# Patient Record
Sex: Female | Born: 1995 | Race: Black or African American | Hispanic: No | Marital: Married | Smoking: Current every day smoker
Health system: Southern US, Community
[De-identification: ages and names within clinical notes are randomized; demographics above are authoritative.]

## PROBLEM LIST (undated history)

## (undated) DIAGNOSIS — J45909 Unspecified asthma, uncomplicated: Secondary | ICD-10-CM

---
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
## 2016-07-27 ENCOUNTER — Ambulatory Visit (HOSPITAL_COMMUNITY)
Admission: EM | Admit: 2016-07-27 | Discharge: 2016-07-27 | Disposition: A | Discharge status: Home / Self Care | Payer: 59 | Attending: Family Medicine | Admitting: Family Medicine

## 2016-07-27 ENCOUNTER — Emergency Department (HOSPITAL_COMMUNITY)
Admission: EM | Admit: 2016-07-27 | Discharge: 2016-07-27 | Disposition: A | Discharge status: Home / Self Care | Payer: 59 | Attending: Emergency Medicine | Admitting: Emergency Medicine

## 2016-07-27 ENCOUNTER — Encounter (HOSPITAL_COMMUNITY): Payer: Self-pay | Admitting: Family Medicine

## 2016-07-27 ENCOUNTER — Emergency Department (HOSPITAL_COMMUNITY): Payer: 59

## 2016-07-27 ENCOUNTER — Encounter (HOSPITAL_COMMUNITY): Payer: Self-pay | Admitting: Emergency Medicine

## 2016-07-27 DIAGNOSIS — J45909 Unspecified asthma, uncomplicated: Secondary | ICD-10-CM | POA: Insufficient documentation

## 2016-07-27 DIAGNOSIS — N390 Urinary tract infection, site not specified: Secondary | ICD-10-CM | POA: Diagnosis not present

## 2016-07-27 DIAGNOSIS — Z79899 Other long term (current) drug therapy: Secondary | ICD-10-CM | POA: Diagnosis not present

## 2016-07-27 DIAGNOSIS — R1011 Right upper quadrant pain: Secondary | ICD-10-CM

## 2016-07-27 DIAGNOSIS — Z7982 Long term (current) use of aspirin: Secondary | ICD-10-CM | POA: Insufficient documentation

## 2016-07-27 DIAGNOSIS — F17290 Nicotine dependence, other tobacco product, uncomplicated: Secondary | ICD-10-CM | POA: Insufficient documentation

## 2016-07-27 HISTORY — DX: Unspecified asthma, uncomplicated: J45.909

## 2016-07-27 LAB — POCT I-STAT, CHEM 8
BUN: 4 mg/dL — ABNORMAL LOW (ref 6–20)
Calcium, Ion: 1.23 mmol/L (ref 1.15–1.40)
Chloride: 107 mmol/L (ref 101–111)
Creatinine, Ser: 0.8 mg/dL (ref 0.44–1.00)
Glucose, Bld: 86 mg/dL (ref 65–99)
HCT: 39 % (ref 36.0–46.0)
Hemoglobin: 13.3 g/dL (ref 12.0–15.0)
Potassium: 3.9 mmol/L (ref 3.5–5.1)
Sodium: 142 mmol/L (ref 135–145)
TCO2: 24 mmol/L (ref 0–100)

## 2016-07-27 LAB — POCT URINALYSIS DIP (DEVICE)
Glucose, UA: NEGATIVE mg/dL
Hgb urine dipstick: NEGATIVE
Ketones, ur: NEGATIVE mg/dL
Nitrite: NEGATIVE
Protein, ur: 30 mg/dL — AB
Specific Gravity, Urine: >=1.03 (ref 1.005–1.030)
Urobilinogen, UA: 0.2 mg/dL (ref 0.0–1.0)
pH: 5.5 (ref 5.0–8.0)

## 2016-07-27 LAB — COMPREHENSIVE METABOLIC PANEL
ALT: 17 U/L (ref 14–54)
AST: 20 U/L (ref 15–41)
Albumin: 4 g/dL (ref 3.5–5.0)
Alkaline Phosphatase: 78 U/L (ref 38–126)
Anion gap: 6 (ref 5–15)
BUN: 7 mg/dL (ref 6–20)
CO2: 24 mmol/L (ref 22–32)
Calcium: 9.5 mg/dL (ref 8.9–10.3)
Chloride: 108 mmol/L (ref 101–111)
Creatinine, Ser: 0.92 mg/dL (ref 0.44–1.00)
GFR calc Af Amer: >60 mL/min (ref >60)
GFR calc non Af Amer: >60 mL/min (ref >60)
Glucose, Bld: 84 mg/dL (ref 65–99)
Potassium: 4.1 mmol/L (ref 3.5–5.1)
Sodium: 138 mmol/L (ref 135–145)
Total Bilirubin: 0.5 mg/dL (ref 0.3–1.2)
Total Protein: 9.4 g/dL — ABNORMAL HIGH (ref 6.5–8.1)

## 2016-07-27 LAB — URINALYSIS, ROUTINE W REFLEX MICROSCOPIC
Bilirubin Urine: NEGATIVE
Glucose, UA: NEGATIVE mg/dL
Hgb urine dipstick: NEGATIVE
Ketones, ur: 5 mg/dL — AB
Nitrite: NEGATIVE
Protein, ur: 30 mg/dL — AB
Specific Gravity, Urine: 1.027 (ref 1.005–1.030)
pH: 5 (ref 5.0–8.0)

## 2016-07-27 LAB — LIPASE, BLOOD: Lipase: 21 U/L (ref 11–51)

## 2016-07-27 LAB — CBC
HCT: 35.9 % — ABNORMAL LOW (ref 36.0–46.0)
Hemoglobin: 11.8 g/dL — ABNORMAL LOW (ref 12.0–15.0)
MCH: 26.5 pg (ref 26.0–34.0)
MCHC: 32.9 g/dL (ref 30.0–36.0)
MCV: 80.5 fL (ref 78.0–100.0)
Platelets: 335 10*3/uL (ref 150–400)
RBC: 4.46 MIL/uL (ref 3.87–5.11)
RDW: 14 % (ref 11.5–15.5)
WBC: 8.4 10*3/uL (ref 4.0–10.5)

## 2016-07-27 LAB — I-STAT BETA HCG BLOOD, ED (MC, WL, AP ONLY): I-stat hCG, quantitative: <5 m[IU]/mL (ref <5)

## 2016-07-27 MED ORDER — SODIUM CHLORIDE 0.9 % IV BOLUS (SEPSIS): 1000.0000 mL | Freq: Once | INTRAVENOUS | Status: DC

## 2016-07-27 MED ORDER — HYDROCODONE-ACETAMINOPHEN 5-325 MG PO TABS
2.0000 | ORAL_TABLET | Freq: Once | ORAL | Status: AC
  Administered 2016-07-27: 2 via ORAL
  Filled 2016-07-27: qty 2

## 2016-07-27 MED ORDER — DEXTROSE 5 % IV SOLN
1.0000 g | Freq: Once | INTRAVENOUS | Status: AC
  Administered 2016-07-27: 1 g via INTRAVENOUS
  Filled 2016-07-27: qty 10

## 2016-07-27 MED ORDER — HYDROMORPHONE HCL 1 MG/ML IJ SOLN
1.0000 mg | Freq: Once | INTRAMUSCULAR | Status: AC
  Administered 2016-07-27: 1 mg via INTRAVENOUS
  Filled 2016-07-27: qty 1

## 2016-07-27 MED ORDER — ONDANSETRON HCL 4 MG/2ML IJ SOLN
4.0000 mg | Freq: Once | INTRAMUSCULAR | Status: AC
  Administered 2016-07-27: 4 mg via INTRAVENOUS
  Filled 2016-07-27: qty 2

## 2016-07-27 MED ORDER — HYDROCODONE-ACETAMINOPHEN 5-325 MG PO TABS: 1.0000 | ORAL_TABLET | ORAL | 0 refills | Status: DC | PRN

## 2016-07-27 MED ORDER — MORPHINE SULFATE (PF) 4 MG/ML IV SOLN: 4.0000 mg | Freq: Once | INTRAVENOUS | Status: DC

## 2016-07-27 MED ORDER — SODIUM CHLORIDE 0.9 % IV BOLUS (SEPSIS)
1000.0000 mL | Freq: Once | INTRAVENOUS | Status: AC
  Administered 2016-07-27: 1000 mL via INTRAVENOUS

## 2016-07-27 MED ORDER — CEPHALEXIN 500 MG PO CAPS: 500.0000 mg | ORAL_CAPSULE | Freq: Three times a day (TID) | ORAL | 0 refills | Status: AC

## 2016-07-27 MED ORDER — SULFAMETHOXAZOLE-TRIMETHOPRIM 800-160 MG PO TABS: 1.0000 | ORAL_TABLET | Freq: Two times a day (BID) | ORAL | 0 refills | Status: AC

## 2016-07-27 MED ORDER — ONDANSETRON 4 MG PO TBDP: 4.0000 mg | ORAL_TABLET | Freq: Three times a day (TID) | ORAL | 0 refills | Status: DC | PRN

## 2016-07-27 NOTE — ED Provider Notes (Signed)
[facility]    CSN: [id number] Arrival date & time: 07/27/16  1014     History   Chief Complaint Chief Complaint  Patient presents with  . Abdominal Pain    HPI Samantha Ashley is a 21 y.o. female.   Pt here for RUQ pain that is radiating into right shoulder. Denies any N,V,D. Took aleve with some relief. The pain is worse with a deep breath or after eating. She's had no nausea or vomiting nor diarrhea. She denies leg pain or shortness of breath.  Pain is worse with a deep breath. She has asthma but has not been significantly worse such that she's had to take her inhaler  Patient has not had intercourse in over a month. Her last menstrual period was 2 weeks ago.      Past Medical History:  Diagnosis Date  . Asthma     There are no active problems to display for this patient.   History reviewed. No pertinent surgical history.  OB History    No data available       Home Medications    Prior to Admission medications   Medication Sig Start Date End Date Taking? Authorizing Provider  sulfamethoxazole-trimethoprim (BACTRIM DS,SEPTRA DS) 800-160 MG tablet Take 1 tablet by mouth 2 (two) times daily. 07/27/16 08/03/16  Elvina Sidle, MD    Family History History reviewed. No pertinent family history.  Social History Social History  Substance Use Topics  . Smoking status: Current Every Day Smoker  . Smokeless tobacco: Never Used  . Alcohol use Not on file     Allergies   Patient has no known allergies.   Review of Systems Review of Systems  HENT: Negative.   Respiratory: Negative.   Cardiovascular: Negative.   Gastrointestinal: Positive for abdominal pain. Negative for diarrhea, nausea and vomiting.     Physical Exam Triage Vital Signs ED Triage Vitals [07/27/16 1052]  Enc Vitals Group     BP      Pulse      Resp      Temp      Temp src      SpO2      Weight      Height      Head Circumference      Peak Flow      Pain Score 7       Pain Loc      Pain Edu?      Excl. in GC?    No data found.   Updated Vital Signs LMP 07/08/2016    Physical Exam  Constitutional: She is oriented to person, place, and time. She appears well-developed and well-nourished.  HENT:  Right Ear: External ear normal.  Left Ear: External ear normal.  Mouth/Throat: Oropharynx is clear and moist.  Eyes: Conjunctivae are normal. Pupils are equal, round, and reactive to light.  Neck: Normal range of motion. Neck supple.  Abdominal: Soft. She exhibits no mass. There is tenderness. There is guarding. There is no rebound.  Tender right upper quadrant  Musculoskeletal: Normal range of motion.  Patient does experience some discomfort in her right upper quadrant when she raises her right arm.  Neurological: She is alert and oriented to person, place, and time.  Skin: Skin is warm and dry.  Nursing note and vitals reviewed.    UC Treatments / Results  Labs (all labs ordered are listed, but only abnormal results are displayed) Labs Reviewed  POCT I-STAT, CHEM 8 -  Abnormal; Notable for the following:       Result Value   BUN 4 (*)    All other components within normal limits  POCT URINALYSIS DIP (DEVICE) - Abnormal; Notable for the following:    Bilirubin Urine SMALL (*)    Protein, ur 30 (*)    Leukocytes, UA SMALL (*)    All other components within normal limits    EKG  EKG Interpretation None       Radiology No results found.  Procedures Procedures (including critical care time)  Medications Ordered in UC Medications - No data to display   Initial Impression / Assessment and Plan / UC Course  I have reviewed the triage vital signs and the nursing notes.  Pertinent labs & imaging results that were available during my care of the patient were reviewed by me and considered in my medical decision making (see chart for details).     Final Clinical Impressions(s) / UC Diagnoses   Final diagnoses:  Lower urinary  tract infectious disease  Right upper quadrant abdominal pain    New Prescriptions New Prescriptions   SULFAMETHOXAZOLE-TRIMETHOPRIM (BACTRIM DS,SEPTRA DS) 800-160 MG TABLET    Take 1 tablet by mouth 2 (two) times daily.     Elvina Sidle, MD 07/27/16 1125

## 2016-07-27 NOTE — ED Notes (Signed)
Her pain increases with movement, deep breathing.  Pain free after pain meds

## 2016-07-27 NOTE — ED Triage Notes (Signed)
Pt here for RUQ pain that is radiating into right shoulder. Denies any N,V,D. Took aleve with some relief

## 2016-07-27 NOTE — ED Triage Notes (Addendum)
Pt complaint of RLQ with associated nausea for a week unrelieved by aleve. Denies GU symptoms.

## 2016-07-27 NOTE — Discharge Instructions (Signed)
-  Have your doctor schedule a HIDA scan. If positive, you will need surgery referral. You do NOT need surgical referral/follow-up until a HIDA scan is complete.

## 2016-07-27 NOTE — ED Provider Notes (Signed)
[facility] Provider Note   CSN: [id number] Arrival date & time: 07/27/16  1220     History   Chief Complaint Chief Complaint  Patient presents with  . Abdominal Pain    HPI Samantha Ashley is a 21 y.o. female.  HPI   21 year old female with no significant past medical history here with right quadrant pain. The patient states that for the last several days, she has had intermittent aching, stabbing, right upper quadrant pain. She went to urgent care today who sent her here for evaluation. She describes the pain as an aching, gnawing pain. The pain does seem to worsen with eating as well as palpation and movement. She denies any associated cough or shortness of breath. No lower extremity swelling or history of DVT. No history of gallbladder issues, although family members have had her gallbladder is out. The pain does seem to worsen with eating fatty foods. She has had one episode of vomiting but otherwise only nausea. No diarrhea. No fevers.  Past Medical History:  Diagnosis Date  . Asthma     There are no active problems to display for this patient.   History reviewed. No pertinent surgical history.  OB History    No data available       Home Medications    Prior to Admission medications   Medication Sig Start Date End Date Taking? Authorizing Provider  aspirin 325 MG EC tablet Take 325 mg by mouth every 6 (six) hours as needed for pain.   Yes Historical Provider, MD  Naproxen Sod-Diphenhydramine (ALEVE PM) 220-25 MG TABS Take 1 tablet by mouth at bedtime as needed (pain).   Yes Historical Provider, MD  cephALEXin (KEFLEX) 500 MG capsule Take 1 capsule (500 mg total) by mouth 3 (three) times daily. 07/27/16 08/03/16  Shaune Pollack, MD  HYDROcodone-acetaminophen (NORCO/VICODIN) 5-325 MG tablet Take 1-2 tablets by mouth every 4 (four) hours as needed for moderate pain or severe pain. 07/27/16   Shaune Pollack, MD  ondansetron (ZOFRAN ODT) 4 MG disintegrating tablet  Take 1 tablet (4 mg total) by mouth every 8 (eight) hours as needed for nausea or vomiting. 07/27/16   Shaune Pollack, MD  sulfamethoxazole-trimethoprim (BACTRIM DS,SEPTRA DS) 800-160 MG tablet Take 1 tablet by mouth 2 (two) times daily. 07/27/16 08/03/16  Elvina Sidle, MD    Family History No family history on file.  Social History Social History  Substance Use Topics  . Smoking status: Current Every Day Smoker    Types: Cigars  . Smokeless tobacco: Never Used  . Alcohol use No     Allergies   Patient has no known allergies.   Review of Systems Review of Systems  Constitutional: Positive for fatigue. Negative for chills and fever.  HENT: Negative for congestion, rhinorrhea and sore throat.   Eyes: Negative for visual disturbance.  Respiratory: Negative for cough, shortness of breath and wheezing.   Cardiovascular: Negative for chest pain and leg swelling.  Gastrointestinal: Positive for abdominal pain, nausea and vomiting. Negative for diarrhea.  Genitourinary: Positive for flank pain. Negative for dysuria, vaginal bleeding and vaginal discharge.  Musculoskeletal: Negative for neck pain.  Skin: Negative for rash.  Allergic/Immunologic: Negative for immunocompromised state.  Neurological: Positive for weakness. Negative for syncope and headaches.  Hematological: Does not bruise/bleed easily.  All other systems reviewed and are negative.    Physical Exam Updated Vital Signs BP 131/88   Pulse 70   Temp 97.9 F (36.6 C) (Oral)   Resp 17  Wt 222 lb 8 oz (100.9 kg)   LMP 07/08/2016   SpO2 97%   Physical Exam  Constitutional: She is oriented to person, place, and time. She appears well-developed and well-nourished. No distress.  HENT:  Head: Normocephalic and atraumatic.  Eyes: Conjunctivae are normal.  Neck: Neck supple.  Cardiovascular: Normal rate, regular rhythm and normal heart sounds.  Exam reveals no friction rub.   No murmur heard. Pulmonary/Chest: Effort  normal and breath sounds normal. No respiratory distress. She has no wheezes. She has no rales.  Abdominal: She exhibits no distension. There is tenderness in the right upper quadrant. There is CVA tenderness (right).  Musculoskeletal: She exhibits no edema.  Neurological: She is alert and oriented to person, place, and time. She exhibits normal muscle tone.  Skin: Skin is warm. Capillary refill takes less than 2 seconds.  Psychiatric: She has a normal mood and affect.  Nursing note and vitals reviewed.    ED Treatments / Results  Labs (all labs ordered are listed, but only abnormal results are displayed) Labs Reviewed  COMPREHENSIVE METABOLIC PANEL - Abnormal; Notable for the following:       Result Value   Total Protein 9.4 (*)    All other components within normal limits  CBC - Abnormal; Notable for the following:    Hemoglobin 11.8 (*)    HCT 35.9 (*)    All other components within normal limits  URINALYSIS, ROUTINE W REFLEX MICROSCOPIC - Abnormal; Notable for the following:    Color, Urine AMBER (*)    APPearance CLOUDY (*)    Ketones, ur 5 (*)    Protein, ur 30 (*)    Leukocytes, UA LARGE (*)    Bacteria, UA MANY (*)    Squamous Epithelial / LPF 0-5 (*)    All other components within normal limits  URINE CULTURE  LIPASE, BLOOD  I-STAT BETA HCG BLOOD, ED ([facility], [facility], [facility] ONLY)    EKG  EKG Interpretation None       Radiology Dg Chest 2 View  Result Date: 07/27/2016 CLINICAL DATA:  Shortness of breath and right-sided chest pain. EXAM: CHEST  2 VIEW COMPARISON:  03/13/2008 FINDINGS: The heart size and mediastinal contours are within normal limits. Both lungs are clear. The visualized skeletal structures are unremarkable. IMPRESSION: No active cardiopulmonary disease. Electronically Signed   By: Gaylyn Rong M.D.   On: 07/27/2016 19:04   US Abdomen Limited Ruq  Result Date: 07/27/2016 CLINICAL DATA:  Right upper quadrant pain and nausea for 1 week. EXAM: US ABDOMEN  LIMITED - RIGHT UPPER QUADRANT COMPARISON:  None. FINDINGS: Gallbladder: No gallstones or wall thickening visualized. No sonographic Murphy sign noted by sonographer. Common bile duct: Diameter: 4 mm Liver: No focal lesion identified. Within normal limits in parenchymal echogenicity. IMPRESSION: Normal right upper quadrant ultrasound.  The gallbladder is normal. Electronically Signed   By: Sherian Rein M.D.   On: 07/27/2016 18:41    Procedures Procedures (including critical care time)  Medications Ordered in ED Medications  sodium chloride 0.9 % bolus 1,000 mL (0 mLs Intravenous Stopped 07/27/16 1925)  ondansetron (ZOFRAN) injection 4 mg (4 mg Intravenous Given 07/27/16 1819)  HYDROmorphone (DILAUDID) injection 1 mg (1 mg Intravenous Given 07/27/16 1819)  cefTRIAXone (ROCEPHIN) 1 g in dextrose 5 % 50 mL IVPB (0 g Intravenous Stopped 07/27/16 2026)  HYDROcodone-acetaminophen (NORCO/VICODIN) 5-325 MG per tablet 2 tablet (2 tablets Oral Given 07/27/16 2012)     Initial Impression / Assessment and Plan /  ED Course  I have reviewed the triage vital signs and the nursing notes.  Pertinent labs & imaging results that were available during my care of the patient were reviewed by me and considered in my medical decision making (see chart for details).    21 yo F with PMHx as above here with RUQ and right flank pain. Labs as above, overall very reassuring. Normal WBC, normal LFTs, normal Bili, and normal U/S make GB abnormality unlikely. She does have +UTI and some of her pain could be from early pyelo, which can be managed as outpt as pt has no fever, no vomiting, normal VS and reassuring labs. No hematuria, pain not c/w renal stone. Renal function nromal. Labs o/w reassuring and pain controlled in ED. CXR clear, no CP or sx to suggest PE or referred pain from pulmonary source. Will d/c with ABX, outpt follow-up. If sx persist, I did discuss possible need for HIDA but will tx as renal etiology at this  time.  Final Clinical Impressions(s) / ED Diagnoses   Final diagnoses:  RUQ pain  Lower urinary tract infectious disease    New Prescriptions Discharge Medication List as of 07/27/2016  8:08 PM    START taking these medications   Details  cephALEXin (KEFLEX) 500 MG capsule Take 1 capsule (500 mg total) by mouth 3 (three) times daily., Starting Mon 07/27/2016, Until Mon 08/03/2016, Print    HYDROcodone-acetaminophen (NORCO/VICODIN) 5-325 MG tablet Take 1-2 tablets by mouth every 4 (four) hours as needed for moderate pain or severe pain., Starting Mon 07/27/2016, Print    ondansetron (ZOFRAN ODT) 4 MG disintegrating tablet Take 1 tablet (4 mg total) by mouth every 8 (eight) hours as needed for nausea or vomiting., Starting Mon 07/27/2016, Print         Shaune Pollack, MD 07/27/16 2325

## 2016-07-27 NOTE — Discharge Instructions (Signed)
Your tests do show a urinary tract infection. This is unlikely to be causing the right upper quadrant pain, however, and I would urge you to go to the emergency department today to have an ultrasound to further evaluate this.

## 2016-07-29 LAB — URINE CULTURE

## 2016-08-04 ENCOUNTER — Emergency Department (HOSPITAL_COMMUNITY)
Admission: EM | Admit: 2016-08-04 | Discharge: 2016-08-04 | Disposition: A | Discharge status: Home / Self Care | Payer: 59 | Attending: Emergency Medicine | Admitting: Emergency Medicine

## 2016-08-04 ENCOUNTER — Encounter (HOSPITAL_COMMUNITY): Payer: Self-pay

## 2016-08-04 DIAGNOSIS — R1011 Right upper quadrant pain: Secondary | ICD-10-CM | POA: Insufficient documentation

## 2016-08-04 DIAGNOSIS — Z5321 Procedure and treatment not carried out due to patient leaving prior to being seen by health care provider: Secondary | ICD-10-CM | POA: Diagnosis not present

## 2016-08-04 LAB — COMPREHENSIVE METABOLIC PANEL
ALT: 21 U/L (ref 14–54)
AST: 25 U/L (ref 15–41)
Albumin: 4 g/dL (ref 3.5–5.0)
Alkaline Phosphatase: 76 U/L (ref 38–126)
Anion gap: 11 (ref 5–15)
BUN: <5 mg/dL — ABNORMAL LOW (ref 6–20)
CO2: 19 mmol/L — ABNORMAL LOW (ref 22–32)
Calcium: 9.4 mg/dL (ref 8.9–10.3)
Chloride: 104 mmol/L (ref 101–111)
Creatinine, Ser: 1.05 mg/dL — ABNORMAL HIGH (ref 0.44–1.00)
GFR calc Af Amer: >60 mL/min (ref >60)
GFR calc non Af Amer: >60 mL/min (ref >60)
Glucose, Bld: 82 mg/dL (ref 65–99)
Potassium: 3.4 mmol/L — ABNORMAL LOW (ref 3.5–5.1)
Sodium: 134 mmol/L — ABNORMAL LOW (ref 135–145)
Total Bilirubin: 0.4 mg/dL (ref 0.3–1.2)
Total Protein: 8.9 g/dL — ABNORMAL HIGH (ref 6.5–8.1)

## 2016-08-04 LAB — I-STAT BETA HCG BLOOD, ED (MC, WL, AP ONLY): I-stat hCG, quantitative: <5 m[IU]/mL (ref <5)

## 2016-08-04 LAB — CBC
HCT: 35.1 % — ABNORMAL LOW (ref 36.0–46.0)
Hemoglobin: 11.5 g/dL — ABNORMAL LOW (ref 12.0–15.0)
MCH: 25.5 pg — ABNORMAL LOW (ref 26.0–34.0)
MCHC: 32.8 g/dL (ref 30.0–36.0)
MCV: 77.8 fL — ABNORMAL LOW (ref 78.0–100.0)
Platelets: 350 10*3/uL (ref 150–400)
RBC: 4.51 MIL/uL (ref 3.87–5.11)
RDW: 13.8 % (ref 11.5–15.5)
WBC: 8.6 10*3/uL (ref 4.0–10.5)

## 2016-08-04 LAB — LIPASE, BLOOD: Lipase: 20 U/L (ref 11–51)

## 2016-08-04 NOTE — ED Notes (Signed)
Pt called to obtain vitals, pt did not answer x3. 

## 2016-08-04 NOTE — ED Notes (Signed)
Pt called to take to room, pt did not answer.

## 2016-08-04 NOTE — ED Notes (Signed)
Pt called to obtain vitals. Pt did not answer x3. 

## 2016-08-04 NOTE — ED Triage Notes (Signed)
Patient here with recurrent right upper epigastric pain. Seen at [facility] ED and told could be gallbladder. To have ENDO tomorrow. Having nausea and vomiting x 1 today and diarrhea. No acute distress, alert and oriented

## 2017-04-19 ENCOUNTER — Emergency Department (HOSPITAL_COMMUNITY)
Admission: EM | Admit: 2017-04-19 | Discharge: 2017-04-19 | Disposition: A | Discharge status: Home / Self Care | Payer: 59 | Attending: Emergency Medicine | Admitting: Emergency Medicine

## 2017-04-19 ENCOUNTER — Encounter (HOSPITAL_COMMUNITY): Payer: Self-pay

## 2017-04-19 ENCOUNTER — Other Ambulatory Visit: Payer: Self-pay

## 2017-04-19 ENCOUNTER — Emergency Department (HOSPITAL_COMMUNITY): Payer: 59

## 2017-04-19 DIAGNOSIS — J45909 Unspecified asthma, uncomplicated: Secondary | ICD-10-CM | POA: Diagnosis not present

## 2017-04-19 DIAGNOSIS — Y9389 Activity, other specified: Secondary | ICD-10-CM | POA: Diagnosis not present

## 2017-04-19 DIAGNOSIS — M25511 Pain in right shoulder: Secondary | ICD-10-CM

## 2017-04-19 DIAGNOSIS — Y999 Unspecified external cause status: Secondary | ICD-10-CM | POA: Diagnosis not present

## 2017-04-19 DIAGNOSIS — M79641 Pain in right hand: Secondary | ICD-10-CM | POA: Insufficient documentation

## 2017-04-19 DIAGNOSIS — M79651 Pain in right thigh: Secondary | ICD-10-CM | POA: Insufficient documentation

## 2017-04-19 DIAGNOSIS — Y92410 Unspecified street and highway as the place of occurrence of the external cause: Secondary | ICD-10-CM | POA: Diagnosis not present

## 2017-04-19 DIAGNOSIS — F17290 Nicotine dependence, other tobacco product, uncomplicated: Secondary | ICD-10-CM | POA: Diagnosis not present

## 2017-04-19 MED ORDER — KETOROLAC TROMETHAMINE 30 MG/ML IJ SOLN
30.0000 mg | Freq: Once | INTRAMUSCULAR | Status: AC
  Administered 2017-04-19: 30 mg via INTRAMUSCULAR
  Filled 2017-04-19: qty 1

## 2017-04-19 MED ORDER — CYCLOBENZAPRINE HCL 10 MG PO TABS: 10.0000 mg | ORAL_TABLET | Freq: Two times a day (BID) | ORAL | 0 refills | Status: DC | PRN
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
## 2018-10-09 ENCOUNTER — Ambulatory Visit (HOSPITAL_COMMUNITY)
Admission: EM | Admit: 2018-10-09 | Discharge: 2018-10-09 | Disposition: A | Discharge status: Home / Self Care | Payer: 59 | Attending: Psychiatry | Admitting: Psychiatry

## 2018-10-09 DIAGNOSIS — F329 Major depressive disorder, single episode, unspecified: Secondary | ICD-10-CM

## 2018-10-09 DIAGNOSIS — F17290 Nicotine dependence, other tobacco product, uncomplicated: Secondary | ICD-10-CM | POA: Insufficient documentation

## 2018-10-09 DIAGNOSIS — J45909 Unspecified asthma, uncomplicated: Secondary | ICD-10-CM | POA: Diagnosis not present

## 2018-10-09 DIAGNOSIS — Z91018 Allergy to other foods: Secondary | ICD-10-CM | POA: Diagnosis not present

## 2018-10-09 DIAGNOSIS — F209 Schizophrenia, unspecified: Secondary | ICD-10-CM | POA: Insufficient documentation
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
## 2018-10-10 ENCOUNTER — Encounter (HOSPITAL_COMMUNITY): Payer: Self-pay | Admitting: *Deleted

## 2018-10-10 ENCOUNTER — Inpatient Hospital Stay (HOSPITAL_COMMUNITY)
Admission: AD | Admit: 2018-10-10 | Discharge: 2018-10-13 | DRG: 885 | Disposition: A | Discharge status: Home / Self Care | Payer: 59 | Source: Intra-hospital | Attending: Psychiatry | Admitting: Psychiatry

## 2018-10-10 ENCOUNTER — Emergency Department (HOSPITAL_COMMUNITY)
Admission: EM | Admit: 2018-10-10 | Discharge: 2018-10-10 | Disposition: A | Discharge status: Other Acute Inpatient Hospital | Payer: 59 | Source: Home / Self Care | Attending: Emergency Medicine | Admitting: Emergency Medicine

## 2018-10-10 ENCOUNTER — Encounter (HOSPITAL_COMMUNITY): Payer: Self-pay

## 2018-10-10 ENCOUNTER — Other Ambulatory Visit: Payer: Self-pay

## 2018-10-10 DIAGNOSIS — E876 Hypokalemia: Secondary | ICD-10-CM

## 2018-10-10 DIAGNOSIS — F419 Anxiety disorder, unspecified: Secondary | ICD-10-CM | POA: Diagnosis present

## 2018-10-10 DIAGNOSIS — J45909 Unspecified asthma, uncomplicated: Secondary | ICD-10-CM | POA: Diagnosis present

## 2018-10-10 DIAGNOSIS — F29 Unspecified psychosis not due to a substance or known physiological condition: Secondary | ICD-10-CM | POA: Insufficient documentation

## 2018-10-10 DIAGNOSIS — E569 Vitamin deficiency, unspecified: Secondary | ICD-10-CM | POA: Diagnosis present

## 2018-10-10 DIAGNOSIS — R441 Visual hallucinations: Secondary | ICD-10-CM | POA: Insufficient documentation

## 2018-10-10 DIAGNOSIS — F17290 Nicotine dependence, other tobacco product, uncomplicated: Secondary | ICD-10-CM | POA: Diagnosis present

## 2018-10-10 DIAGNOSIS — R440 Auditory hallucinations: Secondary | ICD-10-CM | POA: Insufficient documentation

## 2018-10-10 DIAGNOSIS — I10 Essential (primary) hypertension: Secondary | ICD-10-CM | POA: Diagnosis present

## 2018-10-10 DIAGNOSIS — F250 Schizoaffective disorder, bipolar type: Principal | ICD-10-CM

## 2018-10-10 DIAGNOSIS — Z62810 Personal history of physical and sexual abuse in childhood: Secondary | ICD-10-CM | POA: Diagnosis present

## 2018-10-10 DIAGNOSIS — Z20828 Contact with and (suspected) exposure to other viral communicable diseases: Secondary | ICD-10-CM | POA: Diagnosis present

## 2018-10-10 DIAGNOSIS — F209 Schizophrenia, unspecified: Secondary | ICD-10-CM | POA: Diagnosis present

## 2018-10-10 DIAGNOSIS — Z046 Encounter for general psychiatric examination, requested by authority: Secondary | ICD-10-CM | POA: Insufficient documentation

## 2018-10-10 DIAGNOSIS — Z03818 Encounter for observation for suspected exposure to other biological agents ruled out: Secondary | ICD-10-CM | POA: Insufficient documentation

## 2018-10-10 LAB — COMPREHENSIVE METABOLIC PANEL
ALT: 25 U/L (ref 0–44)
AST: 30 U/L (ref 15–41)
Albumin: 4.2 g/dL (ref 3.5–5.0)
Alkaline Phosphatase: 54 U/L (ref 38–126)
Anion gap: 14 (ref 5–15)
BUN: 8 mg/dL (ref 6–20)
CO2: 19 mmol/L — ABNORMAL LOW (ref 22–32)
Calcium: 9.4 mg/dL (ref 8.9–10.3)
Chloride: 101 mmol/L (ref 98–111)
Creatinine, Ser: 1.08 mg/dL — ABNORMAL HIGH (ref 0.44–1.00)
GFR calc Af Amer: >60 mL/min (ref >60)
GFR calc non Af Amer: >60 mL/min (ref >60)
Glucose, Bld: 103 mg/dL — ABNORMAL HIGH (ref 70–99)
Potassium: 2.9 mmol/L — ABNORMAL LOW (ref 3.5–5.1)
Sodium: 134 mmol/L — ABNORMAL LOW (ref 135–145)
Total Bilirubin: 1 mg/dL (ref 0.3–1.2)
Total Protein: 8.5 g/dL — ABNORMAL HIGH (ref 6.5–8.1)

## 2018-10-10 LAB — CBC
HCT: 38 % (ref 36.0–46.0)
Hemoglobin: 12.8 g/dL (ref 12.0–15.0)
MCH: 29.4 pg (ref 26.0–34.0)
MCHC: 33.7 g/dL (ref 30.0–36.0)
MCV: 87.2 fL (ref 80.0–100.0)
Platelets: 250 10*3/uL (ref 150–400)
RBC: 4.36 MIL/uL (ref 3.87–5.11)
RDW: 12.9 % (ref 11.5–15.5)
WBC: 8.2 10*3/uL (ref 4.0–10.5)
nRBC: 0 % (ref 0.0–0.2)

## 2018-10-10 LAB — RAPID URINE DRUG SCREEN, HOSP PERFORMED
Amphetamines: NOT DETECTED
Barbiturates: NOT DETECTED
Benzodiazepines: NOT DETECTED
Cocaine: NOT DETECTED
Opiates: NOT DETECTED
Tetrahydrocannabinol: POSITIVE — AB

## 2018-10-10 LAB — ETHANOL: Alcohol, Ethyl (B): <10 mg/dL (ref <10)

## 2018-10-10 LAB — ACETAMINOPHEN LEVEL: Acetaminophen (Tylenol), Serum: <10 ug/mL — ABNORMAL LOW (ref 10–30)

## 2018-10-10 LAB — SALICYLATE LEVEL: Salicylate Lvl: <7 mg/dL (ref 2.8–30.0)

## 2018-10-10 LAB — I-STAT BETA HCG BLOOD, ED (MC, WL, AP ONLY): I-stat hCG, quantitative: <5 m[IU]/mL (ref <5)

## 2018-10-10 MED ORDER — HYDROXYZINE HCL 25 MG PO TABS
25.0000 mg | ORAL_TABLET | Freq: Three times a day (TID) | ORAL | Status: DC | PRN
  Filled 2018-10-10: qty 1

## 2018-10-10 MED ORDER — ACETAMINOPHEN 325 MG PO TABS: 650.0000 mg | ORAL_TABLET | ORAL | Status: DC | PRN

## 2018-10-10 MED ORDER — ZIPRASIDONE MESYLATE 20 MG IM SOLR
20.0000 mg | Freq: Once | INTRAMUSCULAR | Status: AC
  Administered 2018-10-10: 02:00:00 20 mg via INTRAMUSCULAR
  Filled 2018-10-10: qty 20

## 2018-10-10 MED ORDER — ZIPRASIDONE MESYLATE 20 MG IM SOLR
20.0000 mg | Freq: Once | INTRAMUSCULAR | Status: AC
  Administered 2018-10-10: 17:00:00 20 mg via INTRAMUSCULAR
  Filled 2018-10-10: qty 20

## 2018-10-10 MED ORDER — TRAZODONE HCL 50 MG PO TABS: 50.0000 mg | ORAL_TABLET | Freq: Every evening | ORAL | Status: DC | PRN

## 2018-10-10 MED ORDER — LORAZEPAM 2 MG/ML IJ SOLN: 2.0000 mg | Freq: Four times a day (QID) | INTRAMUSCULAR | Status: DC

## 2018-10-10 MED ORDER — HALOPERIDOL LACTATE 5 MG/ML IJ SOLN: 10.0000 mg | Freq: Four times a day (QID) | INTRAMUSCULAR | Status: DC | PRN

## 2018-10-10 MED ORDER — POTASSIUM CHLORIDE CRYS ER 20 MEQ PO TBCR
80.0000 meq | EXTENDED_RELEASE_TABLET | Freq: Once | ORAL | Status: AC
  Administered 2018-10-10: 06:00:00 80 meq via ORAL
  Filled 2018-10-10: qty 4

## 2018-10-10 MED ORDER — ALUM & MAG HYDROXIDE-SIMETH 200-200-20 MG/5ML PO SUSP: 30.0000 mL | Freq: Four times a day (QID) | ORAL | Status: DC | PRN

## 2018-10-10 MED ORDER — LORAZEPAM 2 MG/ML IJ SOLN
4.0000 mg | Freq: Once | INTRAMUSCULAR | Status: AC
  Administered 2018-10-10: 17:00:00 4 mg via INTRAMUSCULAR

## 2018-10-10 MED ORDER — CLONIDINE HCL 0.1 MG PO TABS
0.2000 mg | ORAL_TABLET | ORAL | Status: DC | PRN
  Administered 2018-10-12: 0.2 mg via ORAL
  Filled 2018-10-10: qty 2

## 2018-10-10 MED ORDER — MAGNESIUM HYDROXIDE 400 MG/5ML PO SUSP: 30.0000 mL | Freq: Every day | ORAL | Status: DC | PRN

## 2018-10-10 MED ORDER — LORAZEPAM 2 MG/ML IJ SOLN
2.0000 mg | INTRAMUSCULAR | Status: DC | PRN
  Filled 2018-10-10: qty 1

## 2018-10-10 MED ORDER — POTASSIUM CHLORIDE CRYS ER 20 MEQ PO TBCR
20.0000 meq | EXTENDED_RELEASE_TABLET | Freq: Two times a day (BID) | ORAL | Status: DC
  Filled 2018-10-10: qty 1

## 2018-10-10 MED ORDER — ALUM & MAG HYDROXIDE-SIMETH 200-200-20 MG/5ML PO SUSP: 30.0000 mL | ORAL | Status: DC | PRN

## 2018-10-10 MED ORDER — ZIPRASIDONE MESYLATE 20 MG IM SOLR
INTRAMUSCULAR | Status: AC
  Administered 2018-10-10: 20 mg via INTRAMUSCULAR
  Filled 2018-10-10: qty 20

## 2018-10-10 MED ORDER — HALOPERIDOL 5 MG PO TABS
5.0000 mg | ORAL_TABLET | Freq: Four times a day (QID) | ORAL | Status: DC | PRN
  Administered 2018-10-12: 5 mg via ORAL
  Filled 2018-10-10 (×2): qty 1

## 2018-10-10 MED ORDER — STERILE WATER FOR INJECTION IJ SOLN
INTRAMUSCULAR | Status: AC
Start: 2018-10-10 — End: 2018-10-10
  Administered 2018-10-10: 0.9 mL
  Filled 2018-10-10: qty 10

## 2018-10-10 MED ORDER — LORAZEPAM 1 MG PO TABS: 2.0000 mg | ORAL_TABLET | ORAL | Status: DC | PRN

## 2018-10-10 MED ORDER — LORAZEPAM 2 MG/ML IJ SOLN
INTRAMUSCULAR | Status: AC
  Administered 2018-10-10: 4 mg via INTRAMUSCULAR
  Filled 2018-10-10: qty 2

## 2018-10-10 MED ORDER — LORAZEPAM 1 MG PO TABS: 2.0000 mg | ORAL_TABLET | Freq: Four times a day (QID) | ORAL | Status: DC

## 2018-10-10 MED ORDER — ACETAMINOPHEN 325 MG PO TABS
650.0000 mg | ORAL_TABLET | Freq: Four times a day (QID) | ORAL | Status: DC | PRN
  Administered 2018-10-11: 650 mg via ORAL

## 2018-10-10 NOTE — ED Notes (Signed)
Updated pt's father of plan of care with permission from pt

## 2018-10-10 NOTE — ED Notes (Signed)
Breakfast ordered 

## 2018-10-10 NOTE — ED Notes (Signed)
Pt sitting on Rm floor having a conversation with imaginary non-visiable people. Pt having visual/auditory hallucination of healthcare personnel

## 2018-10-10 NOTE — ED Notes (Signed)
Pt refusing to take potassium at this time. Pt is awake and stating that she wants to leave. Pt informed by this RN that she is unable to leave at this time.

## 2018-10-10 NOTE — Progress Notes (Signed)
Samantha Ashley is a 23 year old female pt admitted on involuntary basis. On admission, she presents as disorganized, tearful and appears religiously preoccupied. She reports that she knows that she is in a hospital and needed much re-direction to get an accurate history and to go over admission paperwork. She does endorse marijuana usage. She denies being on any medications. She reports that she lives with her partner and will return there once discharged. Samantha Ashley was escorted to the unit, oriented to the milieu and safety maintained.

## 2018-10-10 NOTE — ED Notes (Signed)
Pt resting quietly at this time

## 2018-10-10 NOTE — Progress Notes (Signed)
Admission Skin Assessment: Patient has tattoos on R upper shoulder, R upper chest, L upper arm.  Ear piercings, nose ring intact.  L upper arm tattoo recent, in process of healing.

## 2018-10-10 NOTE — BHH Counselor (Signed)
Clinician spoke to [place], RN and expressed pt has been assessed as a walk-in at [facility], no appropriate beds available. TTS to seek placement.    Vertell Novak, [place], [facility], [facility] Triage Specialist [phone]

## 2018-10-10 NOTE — ED Triage Notes (Signed)
Pt comes via [employer] for bizarre behavior, pt has been yelling all day and talking to her cat and people that are not there, pt refuses to answer questions, denies SI/HI, pt is preaching about being a 23 year old girl and states that white lives dont matter and she will kill white lives, pt keeps talking about someone named Amaryllis Dyke

## 2018-10-10 NOTE — Care Plan (Addendum)
Patient too disorganized to sit still for an interview trying to leave the ward when the doors are opening in need of medications acutely-

## 2018-10-10 NOTE — ED Notes (Signed)
Pt transported to [facility] by [employer]. IVC paperwork and facility paperwork, and patient belongings given to [employer].

## 2018-10-10 NOTE — ED Notes (Signed)
Pt has been changed into scrubs, wanded by security, belongings placed in locker 5 in bh lockers. First exam given to dr. Randal Buba to complete. Requested order for tts. Pt brought in w/c to [facility], report given

## 2018-10-10 NOTE — Tx Team (Signed)
Initial Treatment Plan 10/10/2018 3:54 PM [name] QGB:[id number]    PATIENT STRESSORS: Marital or family conflict Medication change or noncompliance   PATIENT STRENGTHS: Ability for insight Average or above average intelligence Capable of independent living Supportive family/friends   PATIENT IDENTIFIED PROBLEMS: Psychosis "I don't know, my mother named me [name]"                     DISCHARGE CRITERIA:  Ability to meet basic life and health needs Improved stabilization in mood, thinking, and/or behavior Verbal commitment to aftercare and medication compliance  PRELIMINARY DISCHARGE PLAN: Attend aftercare/continuing care group Return to previous living arrangement  PATIENT/FAMILY INVOLVEMENT: This treatment plan has been presented to and reviewed with the patient, Samantha Ashley, and/or family member, .  The patient and family have been given the opportunity to ask questions and make suggestions.  [place], [place], [place] 10/10/2018, 3:54 PM

## 2018-10-10 NOTE — ED Notes (Signed)
Diet was ordered for Lunch. 
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
## 2018-10-10 NOTE — Progress Notes (Signed)
Pt accepted to [place], Bed 508-1 Ricky Ala, NP is the accepting provider.  Alonza Smoker, MD. is the attending provider.  Call report to [phone]  [facility] ED notified.   Pt is IVC   Pt may be transported by [employer] Pt scheduled  to arrive at [facility] @1400   Romie Minus T. Judi Cong, MSW, [place] Disposition Clinical Social Work [phone] (cell) [phone] (office)

## 2018-10-10 NOTE — ED Notes (Signed)
Patient was given a snack and drink. 

## 2018-10-10 NOTE — BHH Counselor (Signed)
Pt was assessed as a walk-in at [facility] another assessment is not needed.   Disposition: Darlyne Russian, PA recommends inpatient treatment. Per Shana Chute, RN no appropriate beds available. TTS to seek placement. Discussed with Charm Rings Nurse pt will be transported to [facility]. Arville Go, [facility] spoke to Santiago Glad, Camera operator.    Vertell Novak, [place], [facility], [facility] Triage Specialist [phone]

## 2018-10-10 NOTE — ED Provider Notes (Signed)
[facility] [place] EMERGENCY DEPARTMENT Provider Note   CSN: [id number] Arrival date & time: 10/10/18  0143     History   Chief Complaint Chief Complaint  Patient presents with  . IVC    HPI Samantha Ashley is a 23 y.o. female.     The history is provided by the police and medical records. The history is limited by the condition of the patient.  Mental Health Problem Presenting symptoms: bizarre behavior, disorganized speech and hallucinations   Patient accompanied by:  Law enforcement Degree of incapacity (severity):  Severe Onset quality:  Unable to specify Timing:  Constant Progression:  Worsening Chronicity:  New Context: drug abuse   Treatment compliance:  Untreated Relieved by:  Nothing Worsened by:  Drugs Ineffective treatments:  None tried Associated symptoms: no abdominal pain and no chest pain   Risk factors: no hx of mental illness     Past Medical History:  Diagnosis Date  . Asthma     There are no active problems to display for this patient.   History reviewed. No pertinent surgical history.   OB History   No obstetric history on file.      Home Medications    Prior to Admission medications   Medication Sig Start Date End Date Taking? Authorizing Provider  cyclobenzaprine (FLEXERIL) 10 MG tablet Take 1 tablet (10 mg total) by mouth 2 (two) times daily as needed for muscle spasms. 04/19/17   Robinson, [place] N, PA-C  HYDROcodone-acetaminophen (NORCO/VICODIN) 5-325 MG tablet Take 1-2 tablets by mouth every 4 (four) hours as needed for moderate pain or severe pain. 07/27/16   Duffy Bruce, MD  ondansetron (ZOFRAN ODT) 4 MG disintegrating tablet Take 1 tablet (4 mg total) by mouth every 8 (eight) hours as needed for nausea or vomiting. 07/27/16   Duffy Bruce, MD    Family History No family history on file.  Social History Social History   Tobacco Use  . Smoking status: Current Every Day Smoker    Types: Cigars  .  Smokeless tobacco: Never Used  Substance Use Topics  . Alcohol use: No  . Drug use: No     Allergies   Coconut oil   Review of Systems Review of Systems  Unable to perform ROS: Psychiatric disorder  Constitutional: Negative for fever.  Respiratory: Negative for cough.   Cardiovascular: Negative for chest pain.  Gastrointestinal: Negative for abdominal pain.  Psychiatric/Behavioral: Positive for hallucinations.     Physical Exam Updated Vital Signs BP (!) 116/51 (BP Location: Right Arm)   Pulse (!) 111   Temp 98.8 F (37.1 C) (Oral)   Resp (!) 21   SpO2 99%   Physical Exam Vitals signs and nursing note reviewed.  Constitutional:      Appearance: She is obese. She is not ill-appearing.  HENT:     Head: Normocephalic and atraumatic.  Eyes:     Conjunctiva/sclera: Conjunctivae normal.     Pupils: Pupils are equal, round, and reactive to light.  Neck:     Musculoskeletal: Normal range of motion and neck supple.  Cardiovascular:     Rate and Rhythm: Normal rate and regular rhythm.     Pulses: Normal pulses.     Heart sounds: Normal heart sounds.  Pulmonary:     Effort: Pulmonary effort is normal.     Breath sounds: Normal breath sounds.  Abdominal:     General: Abdomen is flat. Bowel sounds are normal.     Tenderness: There  is no abdominal tenderness. There is no guarding.  Musculoskeletal: Normal range of motion.     Right lower leg: No edema.     Left lower leg: No edema.  Skin:    General: Skin is warm and dry.     Capillary Refill: Capillary refill takes less than 2 seconds.  Neurological:     Mental Status: She is alert.     Deep Tendon Reflexes: Reflexes normal.  Psychiatric:        Speech: Speech is tangential.        Behavior: Behavior is not agitated.        Thought Content: Thought content does not include suicidal ideation.      ED Treatments / Results  Labs Results for orders placed or performed during the hospital encounter of 10/10/18   Comprehensive metabolic panel  Result Value Ref Range   Sodium 134 (L) 135 - 145 mmol/L   Potassium 2.9 (L) 3.5 - 5.1 mmol/L   Chloride 101 98 - 111 mmol/L   CO2 19 (L) 22 - 32 mmol/L   Glucose, Bld 103 (H) 70 - 99 mg/dL   BUN 8 6 - 20 mg/dL   Creatinine, Ser 2.951.08 (H) 0.44 - 1.00 mg/dL   Calcium 9.4 8.9 - 62.110.3 mg/dL   Total Protein 8.5 (H) 6.5 - 8.1 g/dL   Albumin 4.2 3.5 - 5.0 g/dL   AST 30 15 - 41 U/L   ALT 25 0 - 44 U/L   Alkaline Phosphatase 54 38 - 126 U/L   Total Bilirubin 1.0 0.3 - 1.2 mg/dL   GFR calc non Af Amer >60 >60 mL/min   GFR calc Af Amer >60 >60 mL/min   Anion gap 14 5 - 15  Ethanol  Result Value Ref Range   Alcohol, Ethyl (B) <10 <10 mg/dL  Salicylate level  Result Value Ref Range   Salicylate Lvl <7.0 2.8 - 30.0 mg/dL  Acetaminophen level  Result Value Ref Range   Acetaminophen (Tylenol), Serum <10 (L) 10 - 30 ug/mL  cbc  Result Value Ref Range   WBC 8.2 4.0 - 10.5 K/uL   RBC 4.36 3.87 - 5.11 MIL/uL   Hemoglobin 12.8 12.0 - 15.0 g/dL   HCT 30.838.0 65.736.0 - 84.646.0 %   MCV 87.2 80.0 - 100.0 fL   MCH 29.4 26.0 - 34.0 pg   MCHC 33.7 30.0 - 36.0 g/dL   RDW 96.212.9 95.211.5 - 84.115.5 %   Platelets 250 150 - 400 K/uL   nRBC 0.0 0.0 - 0.2 %  Rapid urine drug screen (hospital performed)  Result Value Ref Range   Opiates NONE DETECTED NONE DETECTED   Cocaine NONE DETECTED NONE DETECTED   Benzodiazepines NONE DETECTED NONE DETECTED   Amphetamines NONE DETECTED NONE DETECTED   Tetrahydrocannabinol POSITIVE (A) NONE DETECTED   Barbiturates NONE DETECTED NONE DETECTED  I-Stat beta hCG blood, ED  Result Value Ref Range   I-stat hCG, quantitative <5.0 <5 mIU/mL   Comment 3           No results found.  EKG EKG Interpretation  Date/Time:  Monday October 10 2018 02:31:02 EDT Ventricular Rate:  95 PR Interval:    QRS Duration: 86 QT Interval:  362 QTC Calculation: 456 R Axis:   51 Text Interpretation:  Sinus rhythm Ventricular premature complex Confirmed by Nicanor AlconPalumbo, Maximilien Hayashi  ([id number]) on 10/10/2018 3:33:17 AM   Radiology No results found.  Procedures Procedures (including critical care time)  Medications Ordered in  ED Medications  potassium chloride SA (K-DUR) CR tablet 80 mEq (has no administration in time range)  alum & mag hydroxide-simeth (MAALOX/MYLANTA) 200-200-20 MG/5ML suspension 30 mL (has no administration in time range)  acetaminophen (TYLENOL) tablet 650 mg (has no administration in time range)  potassium chloride SA (K-DUR) CR tablet 20 mEq (has no administration in time range)  ziprasidone (GEODON) injection 20 mg (20 mg Intramuscular Given 10/10/18 0155)  sterile water (preservative free) injection (0.9 mLs  Given 10/10/18 0155)     Potassium supplementation given  Final Clinical Impressions(s) / ED Diagnoses   Final diagnoses:  Psychosis, unspecified psychosis type (HCC)  Hypokalemia    Medically cleared by me for psychiatry    Abena Erdman, MD 10/10/18 [id number]

## 2018-10-11 DIAGNOSIS — F250 Schizoaffective disorder, bipolar type: Principal | ICD-10-CM

## 2018-10-11 LAB — TSH: TSH: 2.692 u[IU]/mL (ref 0.350–4.500)

## 2018-10-11 LAB — LIPID PANEL
Cholesterol: 103 mg/dL (ref 0–200)
HDL: 21 mg/dL — ABNORMAL LOW (ref >40)
LDL Cholesterol: 67 mg/dL (ref 0–99)
Total CHOL/HDL Ratio: 4.9 RATIO
Triglycerides: 77 mg/dL (ref <150)
VLDL: 15 mg/dL (ref 0–40)

## 2018-10-11 LAB — NOVEL CORONAVIRUS, NAA (HOSP ORDER, SEND-OUT TO REF LAB; TAT 18-24 HRS): SARS-CoV-2, NAA: NOT DETECTED

## 2018-10-11 LAB — HEMOGLOBIN A1C
Hgb A1c MFr Bld: 4.9 % (ref 4.8–5.6)
Mean Plasma Glucose: 93.93 mg/dL

## 2018-10-11 MED ORDER — TEMAZEPAM 30 MG PO CAPS
30.0000 mg | ORAL_CAPSULE | Freq: Every day | ORAL | Status: DC
  Administered 2018-10-11 – 2018-10-12 (×2): 30 mg via ORAL
  Filled 2018-10-11 (×2): qty 1

## 2018-10-11 MED ORDER — DIVALPROEX SODIUM ER 250 MG PO TB24
750.0000 mg | ORAL_TABLET | Freq: Two times a day (BID) | ORAL | Status: DC
  Administered 2018-10-11 – 2018-10-13 (×5): 750 mg via ORAL
  Filled 2018-10-11 (×11): qty 3

## 2018-10-11 MED ORDER — RISPERIDONE 1 MG PO TBDP
3.0000 mg | ORAL_TABLET | Freq: Three times a day (TID) | ORAL | Status: DC
  Administered 2018-10-11 – 2018-10-13 (×6): 3 mg via ORAL
  Filled 2018-10-11 (×13): qty 3

## 2018-10-11 MED ORDER — PROPRANOLOL HCL 60 MG PO TABS
60.0000 mg | ORAL_TABLET | Freq: Two times a day (BID) | ORAL | Status: DC
  Administered 2018-10-11 – 2018-10-13 (×4): 60 mg via ORAL
  Filled 2018-10-11 (×9): qty 1

## 2018-10-11 MED ORDER — ZIPRASIDONE MESYLATE 20 MG IM SOLR
INTRAMUSCULAR | Status: AC
  Filled 2018-10-11: qty 20

## 2018-10-11 MED ORDER — ZIPRASIDONE MESYLATE 20 MG IM SOLR
20.0000 mg | Freq: Once | INTRAMUSCULAR | Status: AC
  Administered 2018-10-11: 20 mg via INTRAMUSCULAR
  Filled 2018-10-11: qty 20

## 2018-10-11 MED ORDER — BENZTROPINE MESYLATE 1 MG PO TABS
1.0000 mg | ORAL_TABLET | Freq: Two times a day (BID) | ORAL | Status: DC
  Administered 2018-10-11 – 2018-10-13 (×5): 1 mg via ORAL
  Filled 2018-10-11 (×10): qty 1

## 2018-10-11 MED ORDER — LORAZEPAM 2 MG/ML IJ SOLN
4.0000 mg | Freq: Once | INTRAMUSCULAR | Status: AC
  Administered 2018-10-11: 4 mg via INTRAMUSCULAR

## 2018-10-11 NOTE — [facility] Counselor (Signed)
CSW tried to meet with pt for assessment. Pt is asleep due to medications given. This CSW's 1st attempt.
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
## 2018-10-11 NOTE — Progress Notes (Signed)
Pt up stating she wanted to leave, pt was informed that she could not leave tonight. Pt began to get upset and could not understand the she was IVC'd.
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
## 2018-10-11 NOTE — Progress Notes (Signed)
D: Pt remained resting in her room this evening after getting IM injections earlier.  A:  Q 15 minute checks were done for safety.  R: safety maintained on unit.

## 2018-10-11 NOTE — Plan of Care (Signed)
Patient is standing outside my office door banging on it screaming obscenities rambling nonstop and a delusional and disorganized fashion.  Clearly needs intramuscular medications we will order lorazepam and Geodon.

## 2018-10-11 NOTE — Progress Notes (Signed)
Recreation Therapy Notes  INPATIENT RECREATION THERAPY ASSESSMENT  Patient Details Name: Samantha Ashley MRN: [id number] DOB: 09-24-1995 Today's Date: 10/11/2018       Information Obtained From: Patient  Able to Participate in Assessment/Interview: Yes  Patient Presentation: Alert  Reason for Admission (Per Patient): Other (Comments)(Pt stated her mom brought her here.)  Patient Stressors: Other (Comment)(Pt stated she didn't trust her mother.)  Coping Skills:   Isolation, Write, TV, Sports, Arguments, Aggression, Music, Meditate, Deep Breathing, Impulsivity, Talk, Art, Prayer, Avoidance, Read, Dance, Hot Bath/Shower  Leisure Interests (2+):  Individual - TV, Social - Social Media, Social - Family  Frequency of Recreation/Participation: Other (Comment)(Daily)  Awareness of Community Resources:  Yes  Community Resources:  Other (Comment)(Science center, pond, shopping center)  Current Use: Yes  If no, Barriers?:    Expressed Interest in [place]: No  [place] of Residence:  [place]  Patient Main Form of Transportation: Car  Patient Strengths:  Voice; Ability to feel pain correctly  Patient Identified Areas of Improvement:  Home-talk to parents, talk to wife, talk to world different  Patient Goal for Hospitalization:  "get out"  Current SI (including self-harm):  No  Current HI:  No  Current AVH: No  Staff Intervention Plan: Group Attendance, Collaborate with Interdisciplinary Treatment Team  Consent to Intern Participation: N/A     Victorino Sparrow, LRT/CTRS  Ria Comment, [place] Paster A 10/11/2018, 11:59 AM

## 2018-10-11 NOTE — Progress Notes (Signed)
Recreation Therapy Notes  Date: 6.16.20 Time:  1000 Location: 500 Hall Dayroom  Group Topic:  Goal Setting  Goal Area(s) Addresses:  Patient will be able to identify at least 3 life goals .  Patient will be able to identify benefit of investing in life goals.  Patient will be able to identify benefit of setting life goals.   Behavioral Response:  Engaged  Intervention: Worksheet  Activity: Lobbyist.  Patients were given a worksheet that asked patients to set a goal for the week, month, year and 5 years.  Patients were to then identify obstacles they would face, what they need to achieve goals and what they could start doing now to work towards goals.  Education:  Discharge Planning, Coping Skills  Education Outcome: Acknowledges Education/In Group Clarification Provided/Needs Additional Education  Clinical Observations:  Pt stated in a week she wants to move, in month- make her new home feel like home, in a year- not sure and in five years- be happy and still goal plan.  Pt expressed her obstacles were her distractions, needs to keep quiet to achieve goals and start doing things right/wrong way and teaching through her voice.    Victorino Sparrow, LRT/CTRS     Victorino Sparrow A 10/11/2018 11:17 AM

## 2018-10-12 MED ORDER — OMEGA-3-ACID ETHYL ESTERS 1 G PO CAPS
1.0000 g | ORAL_CAPSULE | Freq: Two times a day (BID) | ORAL | Status: DC
  Administered 2018-10-12 – 2018-10-13 (×3): 1 g via ORAL
  Filled 2018-10-12 (×7): qty 1

## 2018-10-12 MED ORDER — PRENATAL MULTIVITAMIN CH
1.0000 | ORAL_TABLET | Freq: Every day | ORAL | Status: DC
  Administered 2018-10-12 – 2018-10-13 (×2): 1 via ORAL
  Filled 2018-10-12 (×4): qty 1

## 2018-10-12 NOTE — [facility] Counselor (Signed)
Adult Comprehensive Assessment  Patient ID: Samantha Ashley, female   DOB: 1995-07-06, 23 y.o.   MRN: [id number]  Information Source: Information source: Patient  Current Stressors:  Patient states their primary concerns and needs for treatment are:: "You would have to ask my parents that" Patient states their goals for this hospitilization and ongoing recovery are:: "Going home and learn better coping supports" Educational / Learning stressors: Pt denies stressors Employment / Job issues: Pt is unemployed Family Relationships: Pt reports her parents being her stressors. Pt reports that her parents put her here but haven't asked how she feels. Financial / Lack of resources (include bankruptcy): Pt denies stressors Housing / Lack of housing: Pt denies stressors Physical health (include injuries & life threatening diseases): Pt denies stressors Social relationships: Pt denies stressors Substance abuse: Pt denies stressors Bereavement / Loss: Pt denies stressors  Living/Environment/Situation:  Living Arrangements: Spouse/significant other Living conditions (as described by patient or guardian): "I love it. But we have to move out" Who else lives in the home?: Girlfriend How long has patient lived in current situation?: February of 2019 What is atmosphere in current home: Comfortable, Loving, Supportive  Family History:  Marital status: Long term relationship Long term relationship, how long?: March 24th, 2019 What types of issues is patient dealing with in the relationship?: Pt reports, "her sister" Are you sexually active?: Yes What is your sexual orientation?: Gay Has your sexual activity been affected by drugs, alcohol, medication, or emotional stress?: No Does patient have children?: No  Childhood History:  By whom was/is the patient raised?: Both parents Description of patient's relationship with caregiver when they were a child: "Okay" Patient's description of current  relationship with people who raised him/her: "Okay" How were you disciplined when you got in trouble as a child/adolescent?: "Yelled at and whopped" Does patient have siblings?: Yes Number of Siblings: 4 Description of patient's current relationship with siblings: "It's okay" Did patient suffer any verbal/emotional/physical/sexual abuse as a child?: No Did patient suffer from severe childhood neglect?: No Has patient ever been sexually abused/assaulted/raped as an adolescent or adult?: No Was the patient ever a victim of a crime or a disaster?: No Witnessed domestic violence?: No Has patient been effected by domestic violence as an adult?: No  Education:  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 Currently a student?: Yes Name of school: [employer] How long has the patient attended?: 1 year Learning disability?: No  Employment/Work Situation:   Employment situation: Unemployed Patient's job has been impacted by current illness: No What is the longest time patient has a held a job?: 1 year Where was the patient employed at that time?: [employer] Did You Receive Any Psychiatric Treatment/Services While in the [employer]?: No Are There Guns or Other Weapons in [place]?: No Are These Weapons Safely Secured?: Yes  Financial Resources:   Financial resources: Income from spouse, Private insurance Does patient have a representative payee or guardian?: No  Alcohol/Substance Abuse:   What has been your use of drugs/alcohol within the last 12 months?: Pt denies stressors If attempted suicide, did drugs/alcohol play a role in this?: No Has alcohol/substance abuse ever caused legal problems?: No  Social Support System:   Pensions consultant Support System: Good Describe Community Support System: Girlfriend and mom Type of faith/religion: Christianity How does patient's faith help to cope with current illness?: "It gives me something to cope  with"  Leisure/Recreation:   Leisure and Hobbies: Singing and drawings  Strengths/Needs:  What is the patient's perception of their strengths?: Descalation and understanding Patient states they can use these personal strengths during their treatment to contribute to their recovery: "Be nice to people that want me to be here" Patient states these barriers may affect/interfere with their treatment: N/A Patient states these barriers may affect their return to the community: "My parents and my doctor" Other important information patient would like considered in planning for their treatment: N/A  Discharge Plan:   Currently receiving community mental health services: No Patient states concerns and preferences for aftercare planning are: [facility] Patient states they will know when they are safe and ready for discharge when: "Now" Does patient have access to transportation?: Yes(girlfriend) Does patient have financial barriers related to discharge medications?: No Will patient be returning to same living situation after discharge?: Yes(home with girlfriend)  Summary/Recommendations:   Summary and Recommendations (to be completed by the evaluator): Pt is a 23 year old female who presented with new onset psychosis involving bizarre behaviors, hyperreligiosity, disorganized thought and behavior, and when she arrived on the ward yesterday we had to medicate her immediately as she was trying to elope and making random disjointed statements in a manic fashion. Pt's diagnosis is: Schizoaffective disorder, bipolar type (HCC). Recommendations for pt include: crisis stabilization, therapeutic milieu, medication management, attend and participate in group therapy, and development of a comprehensive mental wellness plan.  Samantha Ashley. 10/12/2018

## 2018-10-12 NOTE — Progress Notes (Signed)
Recreation Therapy Notes  Date: 6.17.20 Time: 1000 Location: 500 Hall Dayroom  Group Topic: Triggers  Goal Area(s) Addresses:  Patient will identify their biggest triggers. Patient will identify how to avoid triggers. Patient will identify how to deal with triggers head on.   Behavioral Response:  Engaged  Intervention: Worksheet  Activity: Triggers.  Patients were to identify their three biggest triggers.  Patients were to identify three ways in which to deal with trigger head on and three was to avoid or reduce exposure to triggers.  Education: Triggers, Discharge Planning  Education Outcome: Acknowledges understanding/In group clarification offered/Needs additional education.   Clinical Observations/Feedback: Pt expressed her triggers were being called stupid or made to feel stupid, not being understood and people pointing and screaming in her face.  Pt reduces exposure to triggers by explaining herself, describing/listening/understanding more, ask to other person to lower their voice or pt will apologize and leave.  Pt faces triggers head on by walking away, step back and tell the other person to stop and try to show understanding vocally.      Victorino Sparrow, LRT/CTRS    Victorino Sparrow A 10/12/2018 11:47 AM

## 2018-10-12 NOTE — Progress Notes (Signed)
[facility]  10/12/2018 9:37 AM Samantha Ashley  MRN:  [id number] Subjective:    Patient is behaviorally contained today she is oriented to person place situation her parents believe she is doing better she still makes some disjointed statements and tends to ramble the longer the interview goes on the more disjointed she becomes but there are no form delusions just vague statements in a pressured fashion.  Tends to direct anger at examiners but this is nonspecific and nonpersonal  Principal Problem: psychosis Diagnosis: Active Problems:   Schizoaffective disorder, bipolar type (HCC)   Schizophrenia (HCC)  Total Time spent with patient: 20 minutes  Past Psychiatric History: neg  Past Medical History:  Past Medical History:  Diagnosis Date  . Asthma    History reviewed. No pertinent surgical history. Family History: History reviewed. No pertinent family history. Family Psychiatric  History: neg Social History:  Social History   Substance and Sexual Activity  Alcohol Use No     Social History   Substance and Sexual Activity  Drug Use Yes  . Types: Marijuana    Social History   Socioeconomic History  . Marital status: Single    Spouse name: Not on file  . Number of children: Not on file  . Years of education: Not on file  . Highest education level: Not on file  Occupational History  . Not on file  Social Needs  . Financial resource strain: Not on file  . Food insecurity    Worry: Not on file    Inability: Not on file  . Transportation needs    Medical: Not on file    Non-medical: Not on file  Tobacco Use  . Smoking status: Current Every Day Smoker    Types: Cigars  . Smokeless tobacco: Never Used  Substance and Sexual Activity  . Alcohol use: No  . Drug use: Yes    Types: Marijuana  . Sexual activity: Yes  Lifestyle  . Physical activity    Days per week: Not on file    Minutes per session: Not on file  . Stress: Not on file  Relationships  .  Social Musicianconnections    Talks on phone: Not on file    Gets together: Not on file    Attends religious service: Not on file    Active member of club or organization: Not on file    Attends meetings of clubs or organizations: Not on file    Relationship status: Not on file  Other Topics Concern  . Not on file  Social History Narrative  . Not on file   Additional Social History:                         Sleep: Fair  Appetite:  Fair  Current Medications: Current Facility-Administered Medications  Medication Dose Route Frequency Provider Last Rate Last Dose  . acetaminophen (TYLENOL) tablet 650 mg  650 mg Oral Q6H PRN Oneta RackLewis, Tanika N, NP   650 mg at 10/11/18 0824  . alum & mag hydroxide-simeth (MAALOX/MYLANTA) 200-200-20 MG/5ML suspension 30 mL  30 mL Oral Q4H PRN Oneta RackLewis, Tanika N, NP      . benztropine (COGENTIN) tablet 1 mg  1 mg Oral BID Malvin JohnsFarah, Laytoya Ion, MD   1 mg at 10/12/18 0731  . cloNIDine (CATAPRES) tablet 0.2 mg  0.2 mg Oral Q4H PRN Malvin JohnsFarah, Shourya Macpherson, MD      . divalproex (DEPAKOTE ER) 24 hr tablet 750  mg  750 mg Oral BID Johnn Hai, MD   750 mg at 10/12/18 6440  . haloperidol (HALDOL) tablet 5 mg  5 mg Oral Q6H PRN Johnn Hai, MD      . haloperidol lactate (HALDOL) injection 10 mg  10 mg Intramuscular Q6H PRN Johnn Hai, MD      . hydrOXYzine (ATARAX/VISTARIL) tablet 25 mg  25 mg Oral TID PRN Derrill Center, NP      . LORazepam (ATIVAN) tablet 2 mg  2 mg Oral Q4H PRN Johnn Hai, MD       Or  . LORazepam (ATIVAN) injection 2 mg  2 mg Intramuscular Q4H PRN Johnn Hai, MD      . magnesium hydroxide (MILK OF MAGNESIA) suspension 30 mL  30 mL Oral Daily PRN Derrill Center, NP      . omega-3 acid ethyl esters (LOVAZA) capsule 1 g  1 g Oral BID Johnn Hai, MD      . prenatal vitamin w/FE, FA (NATACHEW) chewable tablet 1 tablet  1 tablet Oral Q1200 Johnn Hai, MD      . propranolol (INDERAL) tablet 60 mg  60 mg Oral BID Johnn Hai, MD   60 mg at 10/12/18 0731  .  risperiDONE (RISPERDAL M-TABS) disintegrating tablet 3 mg  3 mg Oral TID Johnn Hai, MD   3 mg at 10/12/18 0731  . temazepam (RESTORIL) capsule 30 mg  30 mg Oral QHS Johnn Hai, MD   30 mg at 10/11/18 2145  . traZODone (DESYREL) tablet 50 mg  50 mg Oral QHS PRN Derrill Center, NP        Lab Results:  Results for orders placed or performed during the hospital encounter of 10/10/18 (from the past 48 hour(s))  TSH     Status: None   Collection Time: 10/11/18  6:24 PM  Result Value Ref Range   TSH 2.692 0.350 - 4.500 uIU/mL    Comment: Performed by a 3rd Generation assay with a functional sensitivity of <=0.01 uIU/mL. Performed at [facility], [place] [address] [place], [place] [address]   Hemoglobin A1c     Status: None   Collection Time: 10/11/18  6:24 PM  Result Value Ref Range   Hgb A1c MFr Bld 4.9 4.8 - 5.6 %    Comment: (NOTE) Pre diabetes:          5.7%-6.4% Diabetes:              >6.4% Glycemic control for   <7.0% adults with diabetes    Mean Plasma Glucose 93.93 mg/dL    Comment: Performed at [facility] [address], [place],  [address]  Lipid panel     Status: Abnormal   Collection Time: 10/11/18  6:24 PM  Result Value Ref Range   Cholesterol 103 0 - 200 mg/dL   Triglycerides 77 <150 mg/dL   HDL 21 (L) >40 mg/dL   Total CHOL/HDL Ratio 4.9 RATIO   VLDL 15 0 - 40 mg/dL   LDL Cholesterol 67 0 - 99 mg/dL    Comment:        Total Cholesterol/HDL:CHD Risk Coronary Heart Disease Risk Table                     Men   Women  1/2 Average Risk   3.4   3.3  Average Risk       5.0   4.4  2 X Average Risk  9.6   7.1  3 X Average Risk  23.4   11.0        Use the calculated Patient Ratio above and the CHD Risk Table to determine the patient's CHD Risk.        ATP III CLASSIFICATION (LDL):  <100     mg/dL   Optimal  [id number]  mg/dL   Near or Above                    Optimal  130-159  mg/dL   Borderline  [id number]  mg/dL   High  >829>190      mg/dL   Very High Performed at [facility], [address] [place], [place] [address]     Blood Alcohol level:  Lab Results  Component Value Date   ETH <10 10/10/2018    Metabolic Disorder Labs: Lab Results  Component Value Date   HGBA1C 4.9 10/11/2018   MPG 93.93 10/11/2018   No results found for: PROLACTIN Lab Results  Component Value Date   CHOL 103 10/11/2018   TRIG 77 10/11/2018   HDL 21 (L) 10/11/2018   CHOLHDL 4.9 10/11/2018   VLDL 15 10/11/2018   LDLCALC 67 10/11/2018    Physical Findings: AIMS: Facial and Oral Movements Muscles of Facial Expression: None, normal Lips and Perioral Area: None, normal Jaw: None, normal Tongue: None, normal,Extremity Movements Upper (arms, wrists, hands, fingers): None, normal Lower (legs, knees, ankles, toes): None, normal, Trunk Movements Neck, shoulders, hips: None, normal, Overall Severity Severity of abnormal movements (highest score from questions above): None, normal Incapacitation due to abnormal movements: None, normal Patient's awareness of abnormal movements (rate only patient's report): No Awareness, Dental Status Current problems with teeth and/or dentures?: No Does patient usually wear dentures?: No  CIWA:    COWS:     Musculoskeletal: Strength & Muscle Tone: within normal limits Gait & Station: normal Patient leans: N/A  Psychiatric Specialty Exam: Physical Exam  ROS  Blood pressure (!) 144/95, pulse (!) 115, temperature (!) 97.4 F (36.3 C), resp. rate 20, height 5\' 4"  (1.626 m), weight 108.9 kg, SpO2 100 %.Body mass index is 41.2 kg/m.  General Appearance: Casual  Eye Contact:  Minimal  Speech:  Pressured  Volume:  Increased  Mood:  hypomanic  Affect:  Congruent and Labile  Thought Process:  Irrelevant and Descriptions of Associations: Loose  Orientation:  Full (Time, Place, and Person)  Thought Content:  Illogical and Hallucinations: denies  Suicidal Thoughts:  No  Homicidal Thoughts:   No  Memory:  Immediate;   Fair  Judgement:  Impaired  Insight:  Shallow  Psychomotor Activity:  Restlessness  Concentration:  Concentration: Fair  Recall:  [employer]  Fund of Knowledge:  Fair  Language:  Fair  Akathisia:  Negative  Handed:  Right  AIMS (if indicated):     Assets:  Leisure Time Physical Health Resilience Social Support  ADL's:  Intact  Cognition:  WNL  Sleep:  Number of Hours: 6.75     Treatment Plan Summary: Daily contact with patient to assess and evaluate symptoms and progress in treatment and Medication management continue antipsychotic and mood stabilizer therapy begin neuroprotective measures discussed case with both parents with her permission  Malvin JohnsFARAH,Pristine Gladhill, MD 10/12/2018, 9:37 AM

## 2018-10-12 NOTE — Progress Notes (Signed)
Pt currently lying in bed with eyes closed, respirations even/unlabored, no s/s of distress. Reported that pt took meds with no issues, and went back to her room, denies SI/HI or hallucinations (a)15 min checks (r) safety maintained.

## 2018-10-12 NOTE — Progress Notes (Signed)
[place] NOVEL CORONAVIRUS (COVID-19) DAILY CHECK-OFF SYMPTOMS - answer yes or no to each - every day NO YES  Have you had a fever in the past 24 hours?  . Fever (Temp > 37.80C / 100F) X   Have you had any of these symptoms in the past 24 hours? . New Cough .  Sore Throat  .  Shortness of Breath .  Difficulty Breathing .  Unexplained Body Aches   X   Have you had any one of these symptoms in the past 24 hours not related to allergies?   . Runny Nose .  Nasal Congestion .  Sneezing   X   If you have had runny nose, nasal congestion, sneezing in the past 24 hours, has it worsened?  X   EXPOSURES - check yes or no X   Have you traveled outside the state in the past 14 days?  X   Have you been in contact with someone with a confirmed diagnosis of COVID-19 or PUI in the past 14 days without wearing appropriate PPE?  X   Have you been living in the same home as a person with confirmed diagnosis of COVID-19 or a PUI (household contact)?    X   Have you been diagnosed with COVID-19?    X              What to do next: Answered NO to all: Answered YES to anything:   Proceed with unit schedule Follow the [facility] Inpatient Flowsheet.   

## 2018-10-12 NOTE — Tx Team (Signed)
Interdisciplinary Treatment and Diagnostic Plan Update  10/12/2018 Time of Session: 09:08am Samantha Ashley MRN: [id number]  Principal Diagnosis: <principal problem not specified>  Secondary Diagnoses: Active Problems:   Schizoaffective disorder, bipolar type (HCC)   Schizophrenia (HCC)   Current Medications:  Current Facility-Administered Medications  Medication Dose Route Frequency Provider Last Rate Last Dose  . acetaminophen (TYLENOL) tablet 650 mg  650 mg Oral Q6H PRN Derrill Center, NP   650 mg at 10/11/18 0824  . alum & mag hydroxide-simeth (MAALOX/MYLANTA) 200-200-20 MG/5ML suspension 30 mL  30 mL Oral Q4H PRN Derrill Center, NP      . benztropine (COGENTIN) tablet 1 mg  1 mg Oral BID Johnn Hai, MD   1 mg at 10/12/18 0731  . cloNIDine (CATAPRES) tablet 0.2 mg  0.2 mg Oral Q4H PRN Johnn Hai, MD      . divalproex (DEPAKOTE ER) 24 hr tablet 750 mg  750 mg Oral BID Johnn Hai, MD   750 mg at 10/12/18 1950  . haloperidol (HALDOL) tablet 5 mg  5 mg Oral Q6H PRN Johnn Hai, MD      . haloperidol lactate (HALDOL) injection 10 mg  10 mg Intramuscular Q6H PRN Johnn Hai, MD      . hydrOXYzine (ATARAX/VISTARIL) tablet 25 mg  25 mg Oral TID PRN Derrill Center, NP      . LORazepam (ATIVAN) tablet 2 mg  2 mg Oral Q4H PRN Johnn Hai, MD       Or  . LORazepam (ATIVAN) injection 2 mg  2 mg Intramuscular Q4H PRN Johnn Hai, MD      . magnesium hydroxide (MILK OF MAGNESIA) suspension 30 mL  30 mL Oral Daily PRN Derrill Center, NP      . omega-3 acid ethyl esters (LOVAZA) capsule 1 g  1 g Oral BID Johnn Hai, MD      . prenatal multivitamin tablet 1 tablet  1 tablet Oral Daily Johnn Hai, MD      . propranolol (INDERAL) tablet 60 mg  60 mg Oral BID Johnn Hai, MD   60 mg at 10/12/18 0731  . risperiDONE (RISPERDAL M-TABS) disintegrating tablet 3 mg  3 mg Oral TID Johnn Hai, MD   3 mg at 10/12/18 0731  . temazepam (RESTORIL) capsule 30 mg  30 mg Oral QHS Johnn Hai,  MD   30 mg at 10/11/18 2145  . traZODone (DESYREL) tablet 50 mg  50 mg Oral QHS PRN Derrill Center, NP       PTA Medications: Medications Prior to Admission  Medication Sig Dispense Refill Last Dose  . cyclobenzaprine (FLEXERIL) 10 MG tablet Take 1 tablet (10 mg total) by mouth 2 (two) times daily as needed for muscle spasms. (Patient not taking: Reported on 10/10/2018) 14 tablet 0   . HYDROcodone-acetaminophen (NORCO/VICODIN) 5-325 MG tablet Take 1-2 tablets by mouth every 4 (four) hours as needed for moderate pain or severe pain. (Patient not taking: Reported on 10/10/2018) 12 tablet 0   . ondansetron (ZOFRAN ODT) 4 MG disintegrating tablet Take 1 tablet (4 mg total) by mouth every 8 (eight) hours as needed for nausea or vomiting. (Patient not taking: Reported on 10/10/2018) 12 tablet 0     Patient Stressors: Marital or family conflict Medication change or noncompliance  Patient Strengths: Ability for insight Average or above average intelligence Capable of independent living Supportive family/friends  Treatment Modalities: Medication Management, Group therapy, Case management,  1 to 1 session with  clinician, Psychoeducation, Recreational therapy.   Physician Treatment Plan for Primary Diagnosis: <principal problem not specified> Long Term Goal(s): Improvement in symptoms so as ready for discharge Improvement in symptoms so as ready for discharge   Short Term Goals: Ability to disclose and discuss suicidal ideas Ability to demonstrate self-control will improve Ability to identify and develop effective coping behaviors will improve Ability to maintain clinical measurements within normal limits will improve Ability to disclose and discuss suicidal ideas Ability to demonstrate self-control will improve Ability to identify and develop effective coping behaviors will improve Ability to maintain clinical measurements within normal limits will improve  Medication Management: Evaluate  patient's response, side effects, and tolerance of medication regimen.  Therapeutic Interventions: 1 to 1 sessions, Unit Group sessions and Medication administration.  Evaluation of Outcomes: Progressing  Physician Treatment Plan for Secondary Diagnosis: Active Problems:   Schizoaffective disorder, bipolar type (HCC)   Schizophrenia (HCC)  Long Term Goal(s): Improvement in symptoms so as ready for discharge Improvement in symptoms so as ready for discharge   Short Term Goals: Ability to disclose and discuss suicidal ideas Ability to demonstrate self-control will improve Ability to identify and develop effective coping behaviors will improve Ability to maintain clinical measurements within normal limits will improve Ability to disclose and discuss suicidal ideas Ability to demonstrate self-control will improve Ability to identify and develop effective coping behaviors will improve Ability to maintain clinical measurements within normal limits will improve     Medication Management: Evaluate patient's response, side effects, and tolerance of medication regimen.  Therapeutic Interventions: 1 to 1 sessions, Unit Group sessions and Medication administration.  Evaluation of Outcomes: Progressing   RN Treatment Plan for Primary Diagnosis: <principal problem not specified> Long Term Goal(s): Knowledge of disease and therapeutic regimen to maintain health will improve  Short Term Goals: Ability to participate in decision making will improve, Ability to verbalize feelings will improve, Ability to disclose and discuss suicidal ideas, Ability to identify and develop effective coping behaviors will improve and Compliance with prescribed medications will improve  Medication Management: RN will administer medications as ordered by provider, will assess and evaluate patient's response and provide education to patient for prescribed medication. RN will report any adverse and/or side effects to  prescribing provider.  Therapeutic Interventions: 1 on 1 counseling sessions, Psychoeducation, Medication administration, Evaluate responses to treatment, Monitor vital signs and CBGs as ordered, Perform/monitor CIWA, COWS, AIMS and Fall Risk screenings as ordered, Perform wound care treatments as ordered.  Evaluation of Outcomes: Progressing   LCSW Treatment Plan for Primary Diagnosis: <principal problem not specified> Long Term Goal(s): Safe transition to appropriate next level of care at discharge, Engage patient in therapeutic group addressing interpersonal concerns.  Short Term Goals: Engage patient in aftercare planning with referrals and resources and Increase skills for wellness and recovery  Therapeutic Interventions: Assess for all discharge needs, 1 to 1 time with Social worker, Explore available resources and support systems, Assess for adequacy in community support network, Educate family and significant other(s) on suicide prevention, Complete Psychosocial Assessment, Interpersonal group therapy.  Evaluation of Outcomes: Progressing   Progress in Treatment: Attending groups: Yes. Participating in groups: Yes. Taking medication as prescribed: Yes. Toleration medication: Yes. Family/Significant other contact made: Yes, individual(s) contacted:  will contact if given consent to contact Patient understands diagnosis: No. Discussing patient identified problems/goals with staff: Yes. Medical problems stabilized or resolved: Yes. Denies suicidal/homicidal ideation: Yes. Issues/concerns per patient self-inventory: No. Other:   New problem(s) identified: No, Describe:  None  New Short Term/Long Term Goal(s): Medication stabilization, elimination of SI thoughts, and development of a comprehensive mental wellness plan.   Patient Goals:  "Watch my mouth"  Discharge Plan or Barriers: CSW will continue to follow up for appropriate referrals and possible discharge  planning  Reason for Continuation of Hospitalization: Aggression Delusions  Hallucinations Medication stabilization  Estimated Length of Stay: 2-3 days  Attendees: Patient: 10/12/2018  Physician:Dr. Malvin JohnsBrian Farah, MD 10/12/2018  Nursing:Anthony, RN 10/12/2018  RN Care Manager: 10/12/2018  Social Worker:Abdoulaye Drum, LCSW 10/12/2018  Recreational Therapist: 10/12/2018  Other: 10/12/2018  Other: 10/12/2018  Other: 10/12/2018      Scribe for Treatment Team: Delphia GratesJasmine M Yanelle Sousa, LCSW 10/12/2018 10:52 AM

## 2018-10-12 NOTE — Progress Notes (Signed)
Patient ID: Samantha Ashley, female   DOB: Sep 05, 1995, 23 y.o.   MRN: [id number]   [place] NOVEL CORONAVIRUS (COVID-19) DAILY CHECK-OFF SYMPTOMS - answer yes or no to each - every day NO YES  Have you had a fever in the past 24 hours?  . Fever (Temp > 37.80C / 100F) X   Have you had any of these symptoms in the past 24 hours? . New Cough .  Sore Throat  .  Shortness of Breath .  Difficulty Breathing .  Unexplained Body Aches   X   Have you had any one of these symptoms in the past 24 hours not related to allergies?   . Runny Nose .  Nasal Congestion .  Sneezing   X   If you have had runny nose, nasal congestion, sneezing in the past 24 hours, has it worsened?  X   EXPOSURES - check yes or no X   Have you traveled outside the state in the past 14 days?  X   Have you been in contact with someone with a confirmed diagnosis of COVID-19 or PUI in the past 14 days without wearing appropriate PPE?  X   Have you been living in the same home as a person with confirmed diagnosis of COVID-19 or a PUI (household contact)?    X   Have you been diagnosed with COVID-19?    X              What to do next: Answered NO to all: Answered YES to anything:   Proceed with unit schedule Follow the [facility] Inpatient Flowsheet.

## 2018-10-12 NOTE — Plan of Care (Signed)
D: Pt alert and oriented on the unit. Pt engaging with RN staff and other pts. Pt denies SI/HI, A/VH. Pt participated during unit groups and activities. Pt became agitated after talking with her father on the phone. Pt stated "My ather and girlfriend are plotting against me and trying to put me away." RN processed with pt and she became calm. Pt is cooperative. A: Education, support and encouragement provided, q15 minute safety checks remain in effect. Medications administered per MD orders. R: No reactions/side effects to medicine noted. Pt denies any concerns at this time, and verbally contracts for safety. Pt ambulating on the unit with no issues. Pt remains safe on and off the unit.   Problem: Activity: Goal: Will verbalize the importance of balancing activity with adequate rest periods Outcome: Progressing   Problem: Coping: Goal: Coping ability will improve Outcome: Progressing

## 2018-10-12 NOTE — BHH Group Notes (Signed)
Occupational Therapy Group Note  Date:  10/12/2018 Time:  5:13 PM  Group Topic/Focus:  Leisure Group  Participation Level:  Active  Participation Quality:  Attentive  Affect:  Labile  Cognitive:  Alert  Insight: Improving  Engagement in Group:  Engaged  Modes of Intervention:  Activity, Discussion, Education and Socialization  Additional Comments:    S: "I want to talk to the doctor"  O: OT group focus on leisure this date, while incorporating coping skills to ensure understanding. Pts to play game of Uno: and name a struggle they're facing, a communication skill, something they like about yourself, stress management, and a healthy way to manage anger per certain cards played. Pt also assessed for attention, ability to follow rules, and temperament with rules.   A: Pt presents with labile affect, seen crying and reacting in the hallway prior to group because she is wanting to speak to the doctor. When asked to join group pt states "I don't want to go to groups that won't help me get out of here". Pt then attending group, very animated and smiling with other group members. She showed moderate attention, needing cues. She showed appropriate temperament during the group, but again is labile at this time.  P: OT group will be x1 per week while pt inpatient.  Zenovia Jarred, MSOT, OTR/L Behavioral Health OT/ Acute Relief OT PHP Office: [place] 10/12/2018, 5:13 PM

## 2018-10-13 LAB — PROLACTIN: Prolactin: 52.5 ng/mL — ABNORMAL HIGH (ref 4.8–23.3)

## 2018-10-13 MED ORDER — PROPRANOLOL HCL 40 MG PO TABS: 60.0000 mg | ORAL_TABLET | Freq: Two times a day (BID) | ORAL | 3 refills | Status: DC

## 2018-10-13 MED ORDER — DIVALPROEX SODIUM ER 500 MG PO TB24: ORAL_TABLET | ORAL | 2 refills | Status: DC

## 2018-10-13 MED ORDER — PRENATAL MULTIVITAMIN CH: 1.0000 | ORAL_TABLET | Freq: Every day | ORAL | 2 refills | Status: DC

## 2018-10-13 MED ORDER — TRAZODONE HCL 150 MG PO TABS: 150.0000 mg | ORAL_TABLET | Freq: Every evening | ORAL | 2 refills | Status: DC | PRN

## 2018-10-13 MED ORDER — OMEGA-3-ACID ETHYL ESTERS 1 G PO CAPS: 1.0000 g | ORAL_CAPSULE | Freq: Two times a day (BID) | ORAL | 2 refills | Status: DC

## 2018-10-13 MED ORDER — BENZTROPINE MESYLATE 1 MG PO TABS: 1.0000 mg | ORAL_TABLET | Freq: Two times a day (BID) | ORAL | 2 refills | Status: DC

## 2018-10-13 MED ORDER — RISPERIDONE 3 MG PO TABS: ORAL_TABLET | ORAL | 2 refills | Status: DC

## 2018-10-13 NOTE — Plan of Care (Signed)
Pt was able to exhibit no impulsive behaviors during recreation therapy group sessions.   Victorino Sparrow, LRT/CTRS

## 2018-10-13 NOTE — Progress Notes (Signed)
  [facility] Adult Case Management Discharge Plan :  Will you be returning to the same living situation after discharge:  Yes,  home At discharge, do you have transportation home?: Yes,  mother will pick up Do you have the ability to pay for your medications: Yes,  [employer]  Release of information consent forms completed and in the chart. Patient to Follow up at: Follow-up Information    [facility] Follow up on 10/24/2018.   Why: Telephonic hospital follow up appointment is Monday, 6/29 at 1:00p.  The provider will contact you the day of the appointment.  Contact information: [place] [phone] [place], [facility] Follow up.   Specialty: Behavioral Health Why: The intial appointment will cost $150.00. If you wish to receive services with [facility] please call the office and speak with the billing department. Contact information: [address] [place] [address] [phone]           Next level of care provider has access to [place] and Suicide Prevention discussed: Yes,  with mother  Have you used any form of tobacco in the last 30 days? (Cigarettes, Smokeless Tobacco, Cigars, and/or Pipes): Yes  Has patient been referred to the Quitline?: Patient refused referral  Patient has been referred for addiction treatment: Yes  Joellen Jersey, [place] 10/13/2018, 12:38 PM

## 2018-10-13 NOTE — Progress Notes (Signed)
Patient ID: Samantha Ashley, female   DOB: 06/18/1995, 23 y.o.   MRN: [id number]  Patient discharged to home/self care in the presence of family.  Patient denies SI, HI and AVH.  Patient acknowledges understanding of all discharge instructions and receipt of personal belongings.

## 2018-10-13 NOTE — Progress Notes (Signed)
D: Pt denies SI/HI/AVH. Pt is pleasant and cooperative. Pt continues to have minimal insight into her Tx, but was appropriate on the unit this evening.   A: Pt was offered support and encouragement. Pt was given scheduled medications. Pt was encourage to attend groups. Q 15 minute checks were done for safety.   R:Pt attends groups and interacts well with peers and staff. Pt is taking medication. Pt receptive to treatment and safety maintained on unit.

## 2018-10-13 NOTE — BHH Suicide Risk Assessment (Signed)
[facility] Discharge Suicide Risk Assessment   Principal Problem: <principal problem not specified> Discharge Diagnoses: Active Problems:   Schizoaffective disorder, bipolar type ([place])   Schizophrenia ([place])   Total Time spent with patient: 45 minutes  Musculoskeletal: Strength & Muscle Tone: within normal limits Gait & Station: normal Patient leans: N/A  Psychiatric Specialty Exam: ROS  Blood pressure 119/64, pulse 82, temperature (!) 97.4 F (36.3 C), resp. rate 18, height 5\' 4"  (1.626 m), weight 108.9 kg, SpO2 100 %.Body mass index is 41.2 kg/m.  General Appearance: Casual  Eye Contact::  Good  Speech:  Clear and Coherent409  Volume:  Normal  Mood:  Euthymic  Affect:  Full Range  Thought Process:  Coherent and Descriptions of Associations: Intact  Orientation:  Full (Time, Place, and Person)  Thought Content:  Logical  Suicidal Thoughts:  No  Homicidal Thoughts:  No  Memory:  Immediate;   Fair  Judgement:  Fair  Insight:  Fair  Psychomotor Activity:  Normal  Concentration:  Good  Recall:  Good  Fund of Knowledge:Good  Language: Good  Akathisia:  Negative  Handed:  Right  AIMS (if indicated):     Assets:  Communication Skills Desire for Improvement Financial Resources/Insurance Housing Leisure Time Physical Health Resilience Social Support Talents/Skills  Sleep:  Number of Hours: 3.75  Cognition: WNL  ADL's:  Intact   Mental Status Per Nursing Assessment::   On Admission:  NA  Demographic Factors:  Gay, lesbian, or bisexual orientation  Loss Factors: Decrease in vocational status  Historical Factors: Impulsivity  Risk Reduction Factors:   Employed  Continued Clinical Symptoms:  Bipolar Disorder:   Mixed State  Cognitive Features That Contribute To Risk:  Polarized thinking    Suicide Risk:  Minimal: No identifiable suicidal ideation.  Patients presenting with no risk factors but with morbid ruminations; may be classified as minimal risk based on  the severity of the depressive symptoms  Follow-up Information    [facility] Follow up.   Contact information: [address] [place] [id number] [phone]           Plan Of Care/Follow-up recommendations:  Activity:  full  Sharalyn Lomba, MD 10/13/2018, 10:16 AM
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
## 2018-10-13 NOTE — Progress Notes (Signed)
Recreation Therapy Notes  Date: 6.18.20 Time: 1000 Location: [address] Hall Dayroom   Group Topic: Leisure Education  Goal Area(s) Addresses:  Patient will identify positive leisure activities.  Patient will identify one positive benefit of participation in leisure activities.   Behavioral Response: Engaged  Intervention: Leisure Group Game  Activity: Pictionary.  Patients and staff participated in a game of [place].  One person picks a word from the container and draws what it represents on the board.  The remainder of the group tries to guess what it is.  The person that guesses correctly, gets the next turn.  Each person gets one minute to complete their turn.  Education:  Leisure Education, Dentist  Education Outcome: Acknowledges education/In group clarification offered/Needs additional education  Clinical Observations/Feedback: Pt was appropriate and mellow during group session.  Pt was very active and social during group.   Victorino Sparrow, LRT/CTRS         Ria Comment, Kamry Faraci A 10/13/2018 11:03 AM

## 2018-10-13 NOTE — [facility] Suicide Risk Assessment (Signed)
[facility] INPATIENT:  Family/Significant Other Suicide Prevention Education  Suicide Prevention Education:  Education Completed; mother, Kayana Thoen [phone] has been identified by the patient as the family member/significant other with whom the patient will be residing, and identified as the person(s) who will aid the patient in the event of a mental health crisis (suicidal ideations/suicide attempt).  With written consent from the patient, the family member/significant other has been provided the following suicide prevention education, prior to the and/or following the discharge of the patient.  The suicide prevention education provided includes the following:  Suicide risk factors  Suicide prevention and interventions  National Suicide Hotline telephone number  [facility] assessment telephone number  [facility] Emergency Assistance [place] and/or Residential Mobile Crisis Unit telephone number  Request made of family/significant other to:  Remove weapons (e.g., guns, rifles, knives), all items previously/currently identified as safety concern.    Remove drugs/medications (over-the-counter, prescriptions, illicit drugs), all items previously/currently identified as a safety concern.  The family member/significant other verbalizes understanding of the suicide prevention education information provided.  The family member/significant other agrees to remove the items of safety concern listed above.  Joellen Jersey 10/13/2018, 12:25 PM

## 2018-10-13 NOTE — Progress Notes (Signed)
Recreation Therapy Notes  INPATIENT RECREATION TR PLAN  Patient Details Name: Samantha Ashley MRN: [id number] DOB: May 19, 1995 Today's Date: 10/13/2018  Rec Therapy Plan Is patient appropriate for Therapeutic Recreation?: Yes Treatment times per week: about 3 days Estimated Length of Stay: 5-7 days TR Treatment/Interventions: Group participation (Comment)  Discharge Criteria Pt will be discharged from therapy if:: Discharged Treatment plan/goals/alternatives discussed and agreed upon by:: Patient/family  Discharge Summary Short term goals set: See patient care plan Short term goals met: Complete Progress toward goals comments: Groups attended Which groups?: Leisure education, Goal setting, Other (Comment)(Triggers) Reason goals not met: None Therapeutic equipment acquired: N/A Reason patient discharged from therapy: Discharge from hospital Pt/family agrees with progress & goals achieved: Yes Date patient discharged from therapy: 10/13/18    Victorino Sparrow, LRT/CTRS  Ria Comment, Itati Brocksmith A 10/13/2018, 12:41 PM

## 2018-10-15 ENCOUNTER — Other Ambulatory Visit: Payer: Self-pay

## 2018-10-15 ENCOUNTER — Encounter (HOSPITAL_COMMUNITY): Payer: Self-pay | Admitting: Emergency Medicine

## 2018-10-15 ENCOUNTER — Inpatient Hospital Stay (HOSPITAL_COMMUNITY)
Admission: RE | Admit: 2018-10-15 | Discharge: 2018-10-21 | DRG: 885 | Disposition: A | Discharge status: Home / Self Care | Payer: 59 | Attending: Psychiatry | Admitting: Psychiatry

## 2018-10-15 DIAGNOSIS — F319 Bipolar disorder, unspecified: Secondary | ICD-10-CM | POA: Diagnosis not present

## 2018-10-15 DIAGNOSIS — G4700 Insomnia, unspecified: Secondary | ICD-10-CM | POA: Diagnosis present

## 2018-10-15 DIAGNOSIS — Z91018 Allergy to other foods: Secondary | ICD-10-CM

## 2018-10-15 DIAGNOSIS — Z91410 Personal history of adult physical and sexual abuse: Secondary | ICD-10-CM

## 2018-10-15 DIAGNOSIS — F3130 Bipolar disorder, current episode depressed, mild or moderate severity, unspecified: Secondary | ICD-10-CM | POA: Diagnosis present

## 2018-10-15 DIAGNOSIS — F1220 Cannabis dependence, uncomplicated: Secondary | ICD-10-CM | POA: Diagnosis present

## 2018-10-15 DIAGNOSIS — F17210 Nicotine dependence, cigarettes, uncomplicated: Secondary | ICD-10-CM | POA: Diagnosis present

## 2018-10-15 DIAGNOSIS — Z9114 Patient's other noncompliance with medication regimen: Secondary | ICD-10-CM

## 2018-10-15 DIAGNOSIS — Z79899 Other long term (current) drug therapy: Secondary | ICD-10-CM | POA: Diagnosis not present

## 2018-10-15 DIAGNOSIS — Z6841 Body Mass Index (BMI) 40.0 and over, adult: Secondary | ICD-10-CM | POA: Diagnosis not present

## 2018-10-15 DIAGNOSIS — F250 Schizoaffective disorder, bipolar type: Secondary | ICD-10-CM | POA: Diagnosis present

## 2018-10-15 DIAGNOSIS — F419 Anxiety disorder, unspecified: Secondary | ICD-10-CM | POA: Diagnosis present

## 2018-10-15 MED ORDER — RISPERIDONE 2 MG PO TABS
2.0000 mg | ORAL_TABLET | Freq: Every day | ORAL | Status: DC
  Administered 2018-10-15 – 2018-10-16 (×2): 2 mg via ORAL
  Filled 2018-10-15 (×4): qty 1

## 2018-10-15 MED ORDER — DIVALPROEX SODIUM ER 500 MG PO TB24
500.0000 mg | ORAL_TABLET | Freq: Two times a day (BID) | ORAL | Status: DC
  Administered 2018-10-15 – 2018-10-21 (×12): 500 mg via ORAL
  Filled 2018-10-15 (×21): qty 1

## 2018-10-15 MED ORDER — LORAZEPAM 1 MG PO TABS
2.0000 mg | ORAL_TABLET | ORAL | Status: AC | PRN
  Administered 2018-10-15: 15:00:00 2 mg via ORAL
  Filled 2018-10-15: qty 2

## 2018-10-15 MED ORDER — RISPERIDONE 1 MG PO TABS
1.0000 mg | ORAL_TABLET | Freq: Every day | ORAL | Status: DC
  Administered 2018-10-15 – 2018-10-17 (×3): 1 mg via ORAL
  Filled 2018-10-15 (×7): qty 1

## 2018-10-15 MED ORDER — TRAZODONE HCL 50 MG PO TABS: 50.0000 mg | ORAL_TABLET | Freq: Every evening | ORAL | Status: DC | PRN

## 2018-10-15 MED ORDER — LORAZEPAM 2 MG/ML IJ SOLN
2.0000 mg | Freq: Four times a day (QID) | INTRAMUSCULAR | Status: DC | PRN
  Administered 2018-10-17 – 2018-10-18 (×2): 2 mg via INTRAMUSCULAR
  Filled 2018-10-15 (×3): qty 1

## 2018-10-15 MED ORDER — BENZTROPINE MESYLATE 1 MG PO TABS
1.0000 mg | ORAL_TABLET | Freq: Two times a day (BID) | ORAL | Status: DC
  Administered 2018-10-15 – 2018-10-21 (×12): 1 mg via ORAL
  Filled 2018-10-15 (×4): qty 1
  Filled 2018-10-15: qty 14
  Filled 2018-10-15 (×5): qty 1
  Filled 2018-10-15: qty 14
  Filled 2018-10-15 (×6): qty 1

## 2018-10-15 MED ORDER — OMEGA-3-ACID ETHYL ESTERS 1 G PO CAPS
1.0000 g | ORAL_CAPSULE | Freq: Two times a day (BID) | ORAL | Status: DC
  Administered 2018-10-15 – 2018-10-21 (×11): 1 g via ORAL
  Filled 2018-10-15 (×18): qty 1

## 2018-10-15 MED ORDER — ALUM & MAG HYDROXIDE-SIMETH 200-200-20 MG/5ML PO SUSP: 30.0000 mL | ORAL | Status: DC | PRN

## 2018-10-15 MED ORDER — TRAZODONE HCL 150 MG PO TABS
150.0000 mg | ORAL_TABLET | Freq: Every day | ORAL | Status: DC
  Administered 2018-10-15 – 2018-10-16 (×2): 150 mg via ORAL
  Filled 2018-10-15 (×4): qty 1

## 2018-10-15 MED ORDER — MAGNESIUM HYDROXIDE 400 MG/5ML PO SUSP: 30.0000 mL | Freq: Every day | ORAL | Status: DC | PRN

## 2018-10-15 MED ORDER — PROPRANOLOL HCL 20 MG PO TABS
20.0000 mg | ORAL_TABLET | Freq: Two times a day (BID) | ORAL | Status: DC
  Administered 2018-10-15 – 2018-10-19 (×8): 20 mg via ORAL
  Filled 2018-10-15: qty 2
  Filled 2018-10-15 (×7): qty 1
  Filled 2018-10-15: qty 2
  Filled 2018-10-15 (×3): qty 1

## 2018-10-15 MED ORDER — ACETAMINOPHEN 325 MG PO TABS
650.0000 mg | ORAL_TABLET | Freq: Four times a day (QID) | ORAL | Status: DC | PRN
  Administered 2018-10-20: 650 mg via ORAL
  Filled 2018-10-15: qty 2

## 2018-10-15 MED ORDER — ZIPRASIDONE MESYLATE 20 MG IM SOLR
20.0000 mg | Freq: Once | INTRAMUSCULAR | Status: AC
  Administered 2018-10-15: 20 mg via INTRAMUSCULAR
  Filled 2018-10-15 (×2): qty 20

## 2018-10-15 NOTE — H&P (Signed)
Behavioral Health Medical Screening Exam  Samantha Ashley is an 23 y.o. female presents as a walk in accompanied by her mother with complaints that patient has been uncontrollable. Mother states that patient got out of hospital on Thursday and started smoking marijuana the day she got out.  States she was able to get her to take some of her medication today but she hasn't been taking since the day she got out of the hospital.  Patient is not a good historian, rambling, disorganized, rumination with pressured speech.     Total Time spent with patient: 30 minutes  Psychiatric Specialty Exam: Physical Exam  Constitutional: She appears well-developed and well-nourished. No distress.  Neck: Normal range of motion. Neck supple.  Respiratory: Effort normal.  Musculoskeletal: Normal range of motion.  Neurological: She is alert.  Skin: Skin is warm and dry.    Review of Systems  Unable to perform ROS: Acuity of condition    There were no vitals taken for this visit.There is no height or weight on file to calculate BMI.  General Appearance: Disheveled  Eye Contact:  Good  Speech:  Pressured  Volume:  Increased  Mood:  Euphoric  Affect:  Inappropriate  Thought Process:  Disorganized and Irrelevant  Orientation:  Other:  Self and place  Thought Content:  Illogical and Rumination  Suicidal Thoughts:  No  Homicidal Thoughts:  No  Memory:  Immediate;   Poor Recent;   Poor Remote;   Poor  Judgement:  Impaired  Insight:  Lacking  Psychomotor Activity:  Psychomotor Retardation and Restlessness  Concentration: Concentration: Poor and Attention Span: Poor  Recall:  Poor  Fund of Knowledge:Unable to obtain at this time  Language: Fair  Akathisia:  No  Handed:  Right  AIMS (if indicated):     Assets:  Housing Social Support  Sleep:       Musculoskeletal: Strength & Muscle Tone: within normal limits Gait & Station: normal Patient leans: N/A  There were no vitals taken for this  visit.  Recommendations:  Inpatient psychiatric treatment   Based on my evaluation the patient does not appear to have an emergency medical condition.  Ferne Ellingwood, NP 10/15/2018, 2:49 PM

## 2018-10-15 NOTE — Progress Notes (Signed)
Patient has been up at the nursing station off and on constantly rambling about random subject at which none have made any sense. She is easily redirected when needed. She was compliant with her medications. Safety maintained on unit with 15 min checks.

## 2018-10-15 NOTE — Progress Notes (Signed)
Patient ID: Tuwanna N Waggle, female   DOB: 08/19/1995, 22 y.o.   MRN: [id number]   [place] NOVEL CORONAVIRUS (COVID-19) DAILY CHECK-OFF SYMPTOMS - answer yes or no to each - every day NO YES  Have you had a fever in the past 24 hours?  . Fever (Temp > 37.80C / 100F) X   Have you had any of these symptoms in the past 24 hours? . New Cough .  Sore Throat  .  Shortness of Breath .  Difficulty Breathing .  Unexplained Body Aches   X   Have you had any one of these symptoms in the past 24 hours not related to allergies?   . Runny Nose .  Nasal Congestion .  Sneezing   X   If you have had runny nose, nasal congestion, sneezing in the past 24 hours, has it worsened?  X   EXPOSURES - check yes or no X   Have you traveled outside the state in the past 14 days?  X   Have you been in contact with someone with a confirmed diagnosis of COVID-19 or PUI in the past 14 days without wearing appropriate PPE?  X   Have you been living in the same home as a person with confirmed diagnosis of COVID-19 or a PUI (household contact)?    X   Have you been diagnosed with COVID-19?    X              What to do next: Answered NO to all: Answered YES to anything:   Proceed with unit schedule Follow the [facility] Inpatient Flowsheet.   

## 2018-10-15 NOTE — BH Assessment (Signed)
[facility] Assessment Progress Note Case was staffed with [place] NP who recommended a inpatient admission.    

## 2018-10-15 NOTE — Progress Notes (Signed)
Patient ID: PEGGE CUMBERLEDGE, female   DOB: 06-20-1995, 23 y.o.   MRN: [id number]  D: Pt alert and oriented during [facility] admission process. Pt denies SI/HI, A/VH, and any pain. Pt is cooperative.  "MARLEA GAMBILL is an 23 y.o. female presents as a walk in accompanied by her mother with complaints that patient has been uncontrollable. Mother states that patient got out of hospital on Thursday and started smoking marijuana the day she got out.  States she was able to get her to take some of her medication today but she hasn't been taking since the day she got out of the hospital.  Patient is not a good historian, rambling, disorganized, rumination with pressured speech."  A: Education, support, reassurance, and encouragement provided, q15 minute safety checks initiated. Pt has no belongings and no assigned locker.  R: Pt denies any concerns at this time. Pt ambulating on the unit with no issues. Pt remains safe on the unit.

## 2018-10-15 NOTE — Tx Team (Signed)
Initial Treatment Plan 10/15/2018 4:53 PM Samantha Ashley YZJ:[id number]    PATIENT STRESSORS: Medication change or noncompliance Substance abuse   PATIENT STRENGTHS: Ability for insight Capable of independent living Supportive family/friends Work skills   PATIENT IDENTIFIED PROBLEMS: Psychosis  Non-med adherence  Substance Abuse                 DISCHARGE CRITERIA:  Ability to meet basic life and health needs Improved stabilization in mood, thinking, and/or behavior Verbal commitment to aftercare and medication compliance  PRELIMINARY DISCHARGE PLAN: Outpatient therapy Return to previous living arrangement Return to previous work or school arrangements  PATIENT/FAMILY INVOLVEMENT: This treatment plan has been presented to and reviewed with the patient, Samantha Ashley, and/or family member.  The patient and family have been given the opportunity to ask questions and make suggestions.  Harriet Masson, RN 10/15/2018, 4:53 PM
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
## 2018-10-16 DIAGNOSIS — F250 Schizoaffective disorder, bipolar type: Secondary | ICD-10-CM

## 2018-10-16 LAB — COMPREHENSIVE METABOLIC PANEL
ALT: 24 U/L (ref 0–44)
AST: 21 U/L (ref 15–41)
Albumin: 3.8 g/dL (ref 3.5–5.0)
Alkaline Phosphatase: 52 U/L (ref 38–126)
Anion gap: 8 (ref 5–15)
BUN: 5 mg/dL — ABNORMAL LOW (ref 6–20)
CO2: 26 mmol/L (ref 22–32)
Calcium: 8.9 mg/dL (ref 8.9–10.3)
Chloride: 106 mmol/L (ref 98–111)
Creatinine, Ser: 0.89 mg/dL (ref 0.44–1.00)
GFR calc Af Amer: >60 mL/min (ref >60)
GFR calc non Af Amer: >60 mL/min (ref >60)
Glucose, Bld: 94 mg/dL (ref 70–99)
Potassium: 3.8 mmol/L (ref 3.5–5.1)
Sodium: 140 mmol/L (ref 135–145)
Total Bilirubin: 0.3 mg/dL (ref 0.3–1.2)
Total Protein: 7.6 g/dL (ref 6.5–8.1)

## 2018-10-16 LAB — CBC
HCT: 42.9 % (ref 36.0–46.0)
Hemoglobin: 13.7 g/dL (ref 12.0–15.0)
MCH: 29.1 pg (ref 26.0–34.0)
MCHC: 31.9 g/dL (ref 30.0–36.0)
MCV: 91.1 fL (ref 80.0–100.0)
Platelets: 263 10*3/uL (ref 150–400)
RBC: 4.71 MIL/uL (ref 3.87–5.11)
RDW: 13.2 % (ref 11.5–15.5)
WBC: 6.7 10*3/uL (ref 4.0–10.5)
nRBC: 0 % (ref 0.0–0.2)

## 2018-10-16 LAB — PREGNANCY, URINE: Preg Test, Ur: NEGATIVE

## 2018-10-16 MED ORDER — ZIPRASIDONE MESYLATE 20 MG IM SOLR
20.0000 mg | Freq: Two times a day (BID) | INTRAMUSCULAR | Status: DC | PRN
  Administered 2018-10-17: 14:00:00 20 mg via INTRAMUSCULAR
  Filled 2018-10-16: qty 20

## 2018-10-16 MED ORDER — LORAZEPAM 1 MG PO TABS: 1.0000 mg | ORAL_TABLET | ORAL | Status: DC | PRN

## 2018-10-16 MED ORDER — LORAZEPAM 1 MG PO TABS
ORAL_TABLET | ORAL | Status: AC
  Administered 2018-10-16: 10:00:00
  Filled 2018-10-16: qty 1

## 2018-10-16 NOTE — Progress Notes (Signed)
Writer has observed patient out of the room more tonight. She has been up in the dayroom playing cards with peers, laughing and talking. Writer spoke with her 1:1 and discussed the importance of her staying on her medications once discharged. She reports that her partner smokes and they were celebrating once she was discharged. Writer encouraged her to talk with her partner about the importance of her taking her medications or possibly coming back to the hospital again. She spoke of her mother putting her here and she doesn't want her to have to do this again. She seems a little more clear in her thought process. Support given and safety maintained on unit with 15 min checks.

## 2018-10-16 NOTE — Progress Notes (Signed)
Patient ID: IFEOLUWA BARTZ, female   DOB: 12-15-1995, 23 y.o.   MRN: [id number] D: Pt hyperreligious, constantly kneeling at the nurses' station to pray, was irritable and agitated that she could not be with her father in person today for father's day, and began crying hysterically, and was medicated with Ativan 1mg  PO at 1010.  Pt is currently much calmer and is visible on the unit interacting with her peers.  Pt currently denies SI/HI/AVH, reports that she slept through the night, complaining about her feet hurting because she does not have shoes on, but currently refusing any medication for pain. Will continue to offer.  A: Pt is being maintained on Q15 minute checks for safety, and is currently taking all meds as ordered. R: Pt will continue to be monitored on Q15 minute checks for safety.
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
## 2018-10-16 NOTE — H&P (Signed)
Psychiatric Admission Assessment Adult  Patient Identification: Samantha SakaiDanielle N Stoneking MRN:  [id number] Date of Evaluation:  10/16/2018 Chief Complaint:  Scizoaffective Disorder [place] type Principal Diagnosis: <principal problem not specified> Diagnosis:  Active Problems:   Bipolar depression (HCC)  History of Present Illness: Patient is seen and examined.  Patient is a 23 year old female with a past psychiatric history significant for schizophrenia as well as cannabis dependence who presented as a walk-in appointment to the behavioral health hospital.  The patient was brought by her mother.  The electronic medical record reveals that the mother stated the patient had gotten out of the hospital on the Thursday prior to this admission, and it started smoking marijuana again the day she got out.  The mother stated that she was able to get her to take some of her medications, but had not been taking since today she was discharged.  The patient's discharge summary revealed that she was a chronic user of marijuana.  She had attempted to elope during the early part of the hospitalization.  She was quite disorganized on admission, and the differential diagnosis included schizoaffective disorder versus schizophrenia.  She was stabilized on Risperdal as well as Depakote.  She did require Geodon intramuscularly during the course the hospitalization.  By the 18th she was apparently more stable and was discharged.  She is somewhat disorganized right now and irritable.  She stated that she knows she has her medication in her system because she took some of them.  She is irritable.  She was admitted to the hospital for evaluation and stabilization.  Associated Signs/Symptoms: Depression Symptoms:  insomnia, psychomotor agitation, anxiety, (Hypo) Manic Symptoms:  Delusions, Distractibility, Elevated Mood, Hallucinations, Impulsivity, Irritable Mood, Labiality of Mood, Anxiety Symptoms:  denied Psychotic Symptoms:   Delusions, Hallucinations: Auditory Ideas of Reference, Paranoia, PTSD Symptoms: Negative Total Time spent with patient: 20 minutes  Past Psychiatric History: Patient has several psychiatric hospitalizations.  Her most recent was here on 10/10/2018.  She has been diagnosed with schizoaffective disorder; bipolar type versus schizophrenia.  Is the patient at risk to self? Yes.    Has the patient been a risk to self in the past 6 months? Yes.    Has the patient been a risk to self within the distant past? No.  Is the patient a risk to others? Yes.    Has the patient been a risk to others in the past 6 months? Yes.    Has the patient been a risk to others within the distant past? No.   Prior Inpatient Therapy: Prior Inpatient Therapy: Yes Prior Therapy Dates: 2020 [facility] Reason for Treatment: MH issues Prior Outpatient Therapy: Prior Outpatient Therapy: No Does patient have an ACCT team?: No Does patient have Intensive In-House Services?  : No Does patient have [facility] services? : No Does patient have P4CC services?: No  Alcohol Screening: 1. How often do you have a drink containing alcohol?: Never 2. How many drinks containing alcohol do you have on a typical day when you are drinking?: 1 or 2 3. How often do you have six or more drinks on one occasion?: Never AUDIT-C Score: 0 4. How often during the last year have you found that you were not able to stop drinking once you had started?: Never 5. How often during the last year have you failed to do what was normally expected from you becasue of drinking?: Never 6. How often during the last year have you needed a first drink  in the morning to get yourself going after a heavy drinking session?: Never 7. How often during the last year have you had a feeling of guilt of remorse after drinking?: Never 8. How often during the last year have you been unable to remember what happened the night before because you  had been drinking?: Never 9. Have you or someone else been injured as a result of your drinking?: No 10. Has a relative or friend or a doctor or another health worker been concerned about your drinking or suggested you cut down?: No Alcohol Use Disorder Identification Test Final Score (AUDIT): 0 Alcohol Brief Interventions/Follow-up: AUDIT Score <7 follow-up not indicated, Alcohol Education Substance Abuse History in the last 12 months:  Yes.   Consequences of Substance Abuse: Medical Consequences:  : Most likely connected to these most recent hospitalizations Previous Psychotropic Medications: Yes  Psychological Evaluations: Yes  Past Medical History:  Past Medical History:  Diagnosis Date  . Asthma    History reviewed. No pertinent surgical history. Family History: History reviewed. No pertinent family history. Family Psychiatric  History: Noncontributory Tobacco Screening: Have you used any form of tobacco in the last 30 days? (Cigarettes, Smokeless Tobacco, Cigars, and/or Pipes): Yes Tobacco use, Select all that apply: 4 or less cigarettes per day Are you interested in Tobacco Cessation Medications?: No, patient refused Counseled patient on smoking cessation including recognizing danger situations, developing coping skills and basic information about quitting provided: Refused/Declined practical counseling Social History:  Social History   Substance and Sexual Activity  Alcohol Use No     Social History   Substance and Sexual Activity  Drug Use Yes  . Types: Marijuana    Additional Social History: Marital status: Long term relationship    Pain Medications: See MAR Prescriptions: See MAR Over the Counter: See MAR History of alcohol / drug use?: Yes Longest period of sobriety (when/how long): Unknown Negative Consequences of Use: (Denies) Withdrawal Symptoms: (Denies) Name of Substance 1: Cannabis per hx 1 - Age of First Use: UTA 1 - Amount (size/oz): UTA 1 -  Frequency: UTA 1 - Duration: UTA 1 - Last Use / Amount: UTA per hx                  Allergies:   Allergies  Allergen Reactions  . Coconut Oil Itching   Lab Results:  Results for orders placed or performed during the hospital encounter of 10/15/18 (from the past 48 hour(s))  CBC     Status: None   Collection Time: 10/16/18  6:42 AM  Result Value Ref Range   WBC 6.7 4.0 - 10.5 K/uL   RBC 4.71 3.87 - 5.11 MIL/uL   Hemoglobin 13.7 12.0 - 15.0 g/dL   HCT 16.142.9 09.636.0 - 04.546.0 %   MCV 91.1 80.0 - 100.0 fL   MCH 29.1 26.0 - 34.0 pg   MCHC 31.9 30.0 - 36.0 g/dL   RDW 40.913.2 81.111.5 - 91.415.5 %   Platelets 263 150 - 400 K/uL   nRBC 0.0 0.0 - 0.2 %    Comment: Performed at [facility], [address] [place], [place] [address]  Comprehensive metabolic panel     Status: Abnormal   Collection Time: 10/16/18  6:42 AM  Result Value Ref Range   Sodium 140 135 - 145 mmol/L   Potassium 3.8 3.5 - 5.1 mmol/L   Chloride 106 98 - 111 mmol/L   CO2 26 22 - 32 mmol/L   Glucose, Bld  94 70 - 99 mg/dL   BUN 5 (L) 6 - 20 mg/dL   Creatinine, Ser 0.98 0.44 - 1.00 mg/dL   Calcium 8.9 8.9 - 11.9 mg/dL   Total Protein 7.6 6.5 - 8.1 g/dL   Albumin 3.8 3.5 - 5.0 g/dL   AST 21 15 - 41 U/L   ALT 24 0 - 44 U/L   Alkaline Phosphatase 52 38 - 126 U/L   Total Bilirubin 0.3 0.3 - 1.2 mg/dL   GFR calc non Af Amer >60 >60 mL/min   GFR calc Af Amer >60 >60 mL/min   Anion gap 8 5 - 15    Comment: Performed at [facility], [address] [place], [place] [address]    Blood Alcohol level:  Lab Results  Component Value Date   ETH <10 10/10/2018    Metabolic Disorder Labs:  Lab Results  Component Value Date   HGBA1C 4.9 10/11/2018   MPG 93.93 10/11/2018   Lab Results  Component Value Date   PROLACTIN 52.5 (H) 10/11/2018   Lab Results  Component Value Date   CHOL 103 10/11/2018   TRIG 77 10/11/2018   HDL 21 (L) 10/11/2018   CHOLHDL 4.9 10/11/2018   VLDL 15  10/11/2018   LDLCALC 67 10/11/2018    Current Medications: Current Facility-Administered Medications  Medication Dose Route Frequency Provider Last Rate Last Dose  . acetaminophen (TYLENOL) tablet 650 mg  650 mg Oral Q6H PRN Oneta Rack, NP      . alum & mag hydroxide-simeth (MAALOX/MYLANTA) 200-200-20 MG/5ML suspension 30 mL  30 mL Oral Q4H PRN Oneta Rack, NP      . benztropine (COGENTIN) tablet 1 mg  1 mg Oral BID Oneta Rack, NP   1 mg at 10/16/18 0744  . divalproex (DEPAKOTE ER) 24 hr tablet 500 mg  500 mg Oral BID Oneta Rack, NP   500 mg at 10/16/18 0744  . LORazepam (ATIVAN) injection 2 mg  2 mg Intramuscular Q6H PRN Oneta Rack, NP      . ziprasidone (GEODON) injection 20 mg  20 mg Intramuscular Q12H PRN Antonieta Pert, MD       And  . LORazepam (ATIVAN) tablet 1 mg  1 mg Oral PRN Antonieta Pert, MD      . magnesium hydroxide (MILK OF MAGNESIA) suspension 30 mL  30 mL Oral Daily PRN Oneta Rack, NP      . omega-3 acid ethyl esters (LOVAZA) capsule 1 g  1 g Oral BID Oneta Rack, NP   1 g at 10/16/18 0735  . propranolol (INDERAL) tablet 20 mg  20 mg Oral BID Oneta Rack, NP   20 mg at 10/16/18 0735  . risperiDONE (RISPERDAL) tablet 1 mg  1 mg Oral Daily Oneta Rack, NP   1 mg at 10/16/18 9562   And  . risperiDONE (RISPERDAL) tablet 2 mg  2 mg Oral QHS Oneta Rack, NP   2 mg at 10/15/18 2057  . traZODone (DESYREL) tablet 150 mg  150 mg Oral QHS Oneta Rack, NP   150 mg at 10/15/18 2057  . traZODone (DESYREL) tablet 50 mg  50 mg Oral QHS PRN Oneta Rack, NP       PTA Medications: Medications Prior to Admission  Medication Sig Dispense Refill Last Dose  . benztropine (COGENTIN) 1 MG tablet Take 1 tablet (1 mg total) by mouth 2 (two) times daily. (  Patient not taking: Reported on 10/16/2018) 60 tablet 2 Not Taking at Unknown time  . divalproex (DEPAKOTE ER) 500 MG 24 hr tablet 1 in am 2 at h s (Patient not taking: Reported on  10/16/2018) 90 tablet 2 Not Taking at Unknown time  . omega-3 acid ethyl esters (LOVAZA) 1 g capsule Take 1 capsule (1 g total) by mouth 2 (two) times daily. (Patient not taking: Reported on 10/16/2018) 60 capsule 2 Not Taking at Unknown time  . Prenatal Vit-Fe Fumarate-FA (PRENATAL MULTIVITAMIN) TABS tablet Take 1 tablet by mouth daily. (Patient not taking: Reported on 10/16/2018) 90 tablet 2 Not Taking at Unknown time  . propranolol (INDERAL) 40 MG tablet Take 1.5 tablets (60 mg total) by mouth 2 (two) times daily. (Patient not taking: Reported on 10/16/2018) 60 tablet 3 Not Taking at Unknown time  . risperiDONE (RISPERDAL) 3 MG tablet 1 in am and 2 at h s x 10 days then 2 at hs then on (Patient not taking: Reported on 10/16/2018) 70 tablet 2 Not Taking at Unknown time  . traZODone (DESYREL) 150 MG tablet Take 1 tablet (150 mg total) by mouth at bedtime as needed for sleep. (Patient not taking: Reported on 10/16/2018) 30 tablet 2 Not Taking at Unknown time    Musculoskeletal: Strength & Muscle Tone: within normal limits Gait & Station: normal Patient leans: N/A  Psychiatric Specialty Exam: Physical Exam  Nursing note and vitals reviewed. Constitutional: She appears well-developed and well-nourished.  HENT:  Head: Normocephalic and atraumatic.  Respiratory: Effort normal.  Neurological: She is alert.    ROS  Blood pressure (!) 135/101, pulse (!) 108, temperature 97.7 F (36.5 C), resp. rate 20, height 5\' 4"  (1.626 m), weight 109 kg, SpO2 100 %.Body mass index is 41.25 kg/m.  General Appearance: Disheveled  Eye Contact:  Good  Speech:  Pressured  Volume:  Increased  Mood:  Angry, Dysphoric and Irritable  Affect:  Labile  Thought Process:  Disorganized and Descriptions of Associations: Tangential  Orientation:  Negative  Thought Content:  Delusions and Hallucinations: Auditory  Suicidal Thoughts:  No  Homicidal Thoughts:  No  Memory:  Immediate;   Poor Recent;   Poor Remote;   Poor   Judgement:  Impaired  Insight:  Lacking  Psychomotor Activity:  Increased  Concentration:  Concentration: Poor and Attention Span: Poor  Recall:  Poor  Fund of Knowledge:  Poor  Language:  Fair  Akathisia:  Negative  Handed:  Right  AIMS (if indicated):     Assets:  Desire for Improvement Resilience  ADL's:  Impaired  Cognition:  WNL  Sleep:  Number of Hours: 5.75    Treatment Plan Summary: Daily contact with patient to assess and evaluate symptoms and progress in treatment, Medication management and Plan : Patient is seen and examined.  Patient is a 23 year old female with the above-stated past psychiatric history is admitted secondary to noncompliance with medications as well as psychosis and substance abuse.  She will be admitted to the hospital.  She will be integrated into the milieu.  She will be encouraged to attend groups.  Her laboratories were reviewed and were all essentially normal except for an elevated prolactin from 6/16 which was during the course the hospitalization.  Drug screen was positive for marijuana, and I am sure this is playing a role in her current psychosis.  She will be restarted on her Depakote as well as her Risperdal.  She will also have available trazodone for sleep.  There will be medications available for agitation as well.  Observation Level/Precautions:  15 minute checks  Laboratory:  Chemistry Profile  Psychotherapy:    Medications:    Consultations:    Discharge Concerns:    Estimated LOS:  Other:     Physician Treatment Plan for Primary Diagnosis: <principal problem not specified> Long Term Goal(s): Improvement in symptoms so as ready for discharge  Short Term Goals: Ability to identify changes in lifestyle to reduce recurrence of condition will improve, Ability to verbalize feelings will improve, Ability to disclose and discuss suicidal ideas, Ability to demonstrate self-control will improve, Ability to identify and develop effective coping  behaviors will improve, Ability to maintain clinical measurements within normal limits will improve, Compliance with prescribed medications will improve and Ability to identify triggers associated with substance abuse/mental health issues will improve  Physician Treatment Plan for Secondary Diagnosis: Active Problems:   Bipolar depression ([place])  Long Term Goal(s): Improvement in symptoms so as ready for discharge  Short Term Goals: Ability to identify changes in lifestyle to reduce recurrence of condition will improve, Ability to verbalize feelings will improve, Ability to disclose and discuss suicidal ideas, Ability to demonstrate self-control will improve, Ability to identify and develop effective coping behaviors will improve, Ability to maintain clinical measurements within normal limits will improve, Compliance with prescribed medications will improve and Ability to identify triggers associated with substance abuse/mental health issues will improve  I certify that inpatient services furnished can reasonably be expected to improve the patient's condition.    Sharma Covert, MD 6/21/20201:30 PM

## 2018-10-16 NOTE — [facility] Group Notes (Signed)
[place] Group Notes: (Clinical Social Work)   10/16/2018      Type of Therapy:  Group Therapy   Participation Level:  Did Not Attend - was invited both individually by [facility] and by overhead announcement, chose not to attend.   Selmer Dominion, LCSW 10/16/2018, 2:37 PM

## 2018-10-17 DIAGNOSIS — F319 Bipolar disorder, unspecified: Secondary | ICD-10-CM

## 2018-10-17 LAB — VALPROIC ACID LEVEL: Valproic Acid Lvl: 27 ug/mL — ABNORMAL LOW (ref 50.0–100.0)

## 2018-10-17 MED ORDER — RISPERIDONE 3 MG PO TABS
3.0000 mg | ORAL_TABLET | Freq: Two times a day (BID) | ORAL | Status: DC
  Filled 2018-10-17 (×4): qty 1

## 2018-10-17 MED ORDER — CLONAZEPAM 1 MG PO TABS
1.5000 mg | ORAL_TABLET | Freq: Three times a day (TID) | ORAL | Status: AC
  Administered 2018-10-17 – 2018-10-19 (×6): 1.5 mg via ORAL
  Filled 2018-10-17 (×6): qty 1

## 2018-10-17 MED ORDER — BENZTROPINE MESYLATE 1 MG PO TABS: 1.0000 mg | ORAL_TABLET | Freq: Two times a day (BID) | ORAL | Status: DC

## 2018-10-17 MED ORDER — RISPERIDONE 3 MG PO TABS
3.0000 mg | ORAL_TABLET | Freq: Three times a day (TID) | ORAL | Status: DC
  Administered 2018-10-17 – 2018-10-21 (×12): 3 mg via ORAL
  Filled 2018-10-17 (×17): qty 1

## 2018-10-17 MED ORDER — TRAZODONE HCL 100 MG PO TABS
200.0000 mg | ORAL_TABLET | Freq: Every day | ORAL | Status: DC
  Administered 2018-10-17 – 2018-10-20 (×3): 200 mg via ORAL
  Filled 2018-10-17: qty 2
  Filled 2018-10-17: qty 14
  Filled 2018-10-17 (×4): qty 2

## 2018-10-17 NOTE — Progress Notes (Signed)
Patient approached the nurses station asking for medications for her anxiety. Patient was told they were injections and she said "fine. I need it" and rolled up her sleeve to take them willingly. Patient is in no current distress.

## 2018-10-17 NOTE — Plan of Care (Addendum)
Patient has been in other patient's business, feeding off of the more acute patients. She said "Are you telling me I have to stay because I was trying to advocate for the other patients? You better believe I'm going to be good as hell so I can discharge tomorrow." Patient then continued to cuss, yell, and stomp around the unit.  Patient's parents were contacted and were told of what is going on. Parents were agreeable with dr. Jake Samples, and asked [place] to tell the patient that information. PRN medications may be administered due to the agitation and irritability.   Problem: Education: Goal: Will be free of psychotic symptoms Outcome: Not Progressing Goal: Knowledge of the prescribed therapeutic regimen will improve Outcome: Not Progressing   Problem: Coping: Goal: Coping ability will improve Outcome: Not Progressing Goal: Will verbalize feelings Outcome: Not Progressing

## 2018-10-17 NOTE — Progress Notes (Signed)
[facility] MD Progress Note  10/17/2018 9:22 AM Samantha Ashley  MRN:  [id number] Subjective:   Patient is more contained behaviorally she is of course compliant with medications here is focused on her discharge yet has not recalibrated to her baseline.  She is alert and oriented to person place situation not exact date rambles somewhat pressured denies current auditory or visual hallucinations but again does not want to give up her cannabis which is causing such a great deal of difficulty. Principal Problem: Bipolar/schizoaffective type condition/cannabis dependency/noncompliance upon discharge resumption of cannabis dependence/resulting in immediate readmission Diagnosis: Active Problems:   Bipolar depression (HCC)  Total Time spent with patient: 20 minutes  Past Medical History:  Past Medical History:  Diagnosis Date  . Asthma    History reviewed. No pertinent surgical history. Family History: History reviewed. No pertinent family history.  Social History:  Social History   Substance and Sexual Activity  Alcohol Use No     Social History   Substance and Sexual Activity  Drug Use Yes  . Types: Marijuana    Social History   Socioeconomic History  . Marital status: Single    Spouse name: Not on file  . Number of children: Not on file  . Years of education: Not on file  . Highest education level: Not on file  Occupational History  . Not on file  Social Needs  . Financial resource strain: Not on file  . Food insecurity    Worry: Not on file    Inability: Not on file  . Transportation needs    Medical: Not on file    Non-medical: Not on file  Tobacco Use  . Smoking status: Current Every Day Smoker    Types: Cigars  . Smokeless tobacco: Never Used  Substance and Sexual Activity  . Alcohol use: No  . Drug use: Yes    Types: Marijuana  . Sexual activity: Yes  Lifestyle  . Physical activity    Days per week: Not on file    Minutes per session: Not on file  . Stress:  Not on file  Relationships  . Social Musicianconnections    Talks on phone: Not on file    Gets together: Not on file    Attends religious service: Not on file    Active member of club or organization: Not on file    Attends meetings of clubs or organizations: Not on file    Relationship status: Not on file  Other Topics Concern  . Not on file  Social History Narrative  . Not on file   Additional Social History:    Pain Medications: See MAR Prescriptions: See MAR Over the Counter: See MAR History of alcohol / drug use?: Yes Longest period of sobriety (when/how long): Unknown Negative Consequences of Use: (Denies) Withdrawal Symptoms: (Denies) Name of Substance 1: Cannabis per hx 1 - Age of First Use: UTA 1 - Amount (size/oz): UTA 1 - Frequency: UTA 1 - Duration: UTA 1 - Last Use / Amount: UTA per hx                  Sleep: Good  Appetite:  Good  Current Medications: Current Facility-Administered Medications  Medication Dose Route Frequency Provider Last Rate Last Dose  . acetaminophen (TYLENOL) tablet 650 mg  650 mg Oral Q6H PRN Oneta RackLewis, Tanika N, NP      . alum & mag hydroxide-simeth (MAALOX/MYLANTA) 200-200-20 MG/5ML suspension 30 mL  30 mL Oral Q4H PRN Melvyn NethLewis,  Marcy Panning, NP      . benztropine (COGENTIN) tablet 1 mg  1 mg Oral BID Derrill Center, NP   1 mg at 10/17/18 0802  . divalproex (DEPAKOTE ER) 24 hr tablet 500 mg  500 mg Oral BID Derrill Center, NP   500 mg at 10/17/18 0802  . LORazepam (ATIVAN) injection 2 mg  2 mg Intramuscular Q6H PRN Derrill Center, NP      . ziprasidone (GEODON) injection 20 mg  20 mg Intramuscular Q12H PRN Sharma Covert, MD       And  . LORazepam (ATIVAN) tablet 1 mg  1 mg Oral PRN Sharma Covert, MD      . magnesium hydroxide (MILK OF MAGNESIA) suspension 30 mL  30 mL Oral Daily PRN Derrill Center, NP      . omega-3 acid ethyl esters (LOVAZA) capsule 1 g  1 g Oral BID Derrill Center, NP   1 g at 10/17/18 0802  . propranolol  (INDERAL) tablet 20 mg  20 mg Oral BID Derrill Center, NP   20 mg at 10/17/18 0802  . risperiDONE (RISPERDAL) tablet 3 mg  3 mg Oral BID Johnn Hai, MD      . traZODone (DESYREL) tablet 200 mg  200 mg Oral QHS Johnn Hai, MD        Lab Results:  Results for orders placed or performed during the hospital encounter of 10/15/18 (from the past 48 hour(s))  CBC     Status: None   Collection Time: 10/16/18  6:42 AM  Result Value Ref Range   WBC 6.7 4.0 - 10.5 K/uL   RBC 4.71 3.87 - 5.11 MIL/uL   Hemoglobin 13.7 12.0 - 15.0 g/dL   HCT 42.9 36.0 - 46.0 %   MCV 91.1 80.0 - 100.0 fL   MCH 29.1 26.0 - 34.0 pg   MCHC 31.9 30.0 - 36.0 g/dL   RDW 13.2 11.5 - 15.5 %   Platelets 263 150 - 400 K/uL   nRBC 0.0 0.0 - 0.2 %    Comment: Performed at [facility], [place] [address] [place], [place] [address]  Comprehensive metabolic panel     Status: Abnormal   Collection Time: 10/16/18  6:42 AM  Result Value Ref Range   Sodium 140 135 - 145 mmol/L   Potassium 3.8 3.5 - 5.1 mmol/L   Chloride 106 98 - 111 mmol/L   CO2 26 22 - 32 mmol/L   Glucose, Bld 94 70 - 99 mg/dL   BUN 5 (L) 6 - 20 mg/dL   Creatinine, Ser 0.89 0.44 - 1.00 mg/dL   Calcium 8.9 8.9 - 10.3 mg/dL   Total Protein 7.6 6.5 - 8.1 g/dL   Albumin 3.8 3.5 - 5.0 g/dL   AST 21 15 - 41 U/L   ALT 24 0 - 44 U/L   Alkaline Phosphatase 52 38 - 126 U/L   Total Bilirubin 0.3 0.3 - 1.2 mg/dL   GFR calc non Af Amer >60 >60 mL/min   GFR calc Af Amer >60 >60 mL/min   Anion gap 8 5 - 15    Comment: Performed at [facility], [place] [address] [place], [place] [address]  Pregnancy, urine     Status: None   Collection Time: 10/16/18  2:41 PM  Result Value Ref Range   Preg Test, Ur NEGATIVE NEGATIVE    Comment:        THE  SENSITIVITY OF THIS METHODOLOGY IS >20 mIU/mL. Performed at [facility], [address] [place], [place] [address]   Valproic acid level     Status: Abnormal    Collection Time: 10/17/18  6:28 AM  Result Value Ref Range   Valproic Acid Lvl 27 (L) 50.0 - 100.0 ug/mL    Comment: Performed at [facility], [address] [place], [place] [address]    Blood Alcohol level:  Lab Results  Component Value Date   ETH <10 10/10/2018    Metabolic Disorder Labs: Lab Results  Component Value Date   HGBA1C 4.9 10/11/2018   MPG 93.93 10/11/2018   Lab Results  Component Value Date   PROLACTIN 52.5 (H) 10/11/2018   Lab Results  Component Value Date   CHOL 103 10/11/2018   TRIG 77 10/11/2018   HDL 21 (L) 10/11/2018   CHOLHDL 4.9 10/11/2018   VLDL 15 10/11/2018   LDLCALC 67 10/11/2018    Physical Findings: AIMS: Facial and Oral Movements Muscles of Facial Expression: None, normal Lips and Perioral Area: None, normal Jaw: None, normal Tongue: None, normal,Extremity Movements Upper (arms, wrists, hands, fingers): None, normal Lower (legs, knees, ankles, toes): None, normal, Trunk Movements Neck, shoulders, hips: None, normal, Overall Severity Severity of abnormal movements (highest score from questions above): None, normal Incapacitation due to abnormal movements: None, normal Patient's awareness of abnormal movements (rate only patient's report): No Awareness, Dental Status Current problems with teeth and/or dentures?: No Does patient usually wear dentures?: No  CIWA:  CIWA-Ar Total: 0 COWS:  COWS Total Score: 1  Musculoskeletal: Strength & Muscle Tone: within normal limits Gait & Station: normal Patient leans: N/A  Psychiatric Specialty Exam: Physical Exam  ROS  Blood pressure 102/81, pulse (!) 102, temperature 97.7 F (36.5 C), resp. rate 20, height 5\' 4"  (1.626 m), weight 109 kg, SpO2 100 %.Body mass index is 41.25 kg/m.  General Appearance: Casual  Eye Contact:  Fair  Speech:  Clear and Coherent  Volume:  Increased  Mood:  Hypomanic  Affect:  Appropriate and Congruent  Thought Process:  Linear and  Descriptions of Associations: Tangential  Orientation:  Full (Time, Place, and Person)  Thought Content:  Logical and Tangential  Suicidal Thoughts:  No  Homicidal Thoughts:  No  Memory:  Immediate;   Fair  Judgement:  Fair  Insight:  Fair  Psychomotor Activity:  Normal  Concentration:  Concentration: Fair  Recall:  [employer]  Fund of Knowledge:  Fair  Language:  Fair  Akathisia:  Negative  Handed:  Right  AIMS (if indicated):     Assets:  Resilience  ADL's:  Intact  Cognition:  WNL  Sleep:  Number of Hours: 6.5     Treatment Plan Summary: Daily contact with patient to assess and evaluate symptoms and progress in treatment and Medication management continue cognitive-based and reality-based therapy continue to discuss abstinence and escalate risperidone and continue mood stabilizer therapy-discussed case with family with her permission  Malvin JohnsFARAH,Nedda Gains, MD 10/17/2018, 9:22 AM

## 2018-10-17 NOTE — Progress Notes (Signed)
Nursing Progress Note: 7p-7a D: Pt currently presents with a labile/preoccuppied/cooperative affect and behavior. Interacting appropriately with the milieu. Pt reports good sleep during the previous night with current medication regimen. Pt did attend wrap-up group.  A: Pt provided with medications per providers orders. Pt's labs and vitals were monitored throughout the night. Pt supported emotionally and encouraged to express concerns and questions. Pt educated on medications.  R: Pt's safety ensured with 15 minute and environmental checks. Pt currently denies SI, HI, and AVH. Pt verbally contracts to seek staff if SI,HI, or AVH occurs and to consult with staff before acting on any harmful thoughts. Will continue to monitor.   [place] NOVEL CORONAVIRUS (COVID-19) DAILY CHECK-OFF SYMPTOMS - answer yes or no to each - every day NO YES  Have you had a fever in the past 24 hours?  . Fever (Temp > 37.80C / 100F) X   Have you had any of these symptoms in the past 24 hours? . New Cough .  Sore Throat  .  Shortness of Breath .  Difficulty Breathing .  Unexplained Body Aches   X   Have you had any one of these symptoms in the past 24 hours not related to allergies?   . Runny Nose .  Nasal Congestion .  Sneezing   X   If you have had runny nose, nasal congestion, sneezing in the past 24 hours, has it worsened?  X   EXPOSURES - check yes or no X   Have you traveled outside the state in the past 14 days?  X   Have you been in contact with someone with a confirmed diagnosis of COVID-19 or PUI in the past 14 days without wearing appropriate PPE?  X   Have you been living in the same home as a person with confirmed diagnosis of COVID-19 or a PUI (household contact)?    X   Have you been diagnosed with COVID-19?    X              What to do next: Answered NO to all: Answered YES to anything:   Proceed with unit schedule Follow the [facility] Inpatient Flowsheet.

## 2018-10-17 NOTE — Progress Notes (Signed)
Recreation Therapy Notes  Date: 10/17/2018 Time: 10:00 am Location: [address] hall   Group Topic: Introduction to Anxiety.  Goal Area(s) Addresses:  Patient will work on worksheet on Introduction to Anxiety Patient will follow directions on first prompt.  Behavioral Response: Appropriate  Intervention: Worksheet  Activity:  Staff on [address] hall were provided with a worksheet on Introduction to Anxiety. Staff was instructed to give it to the patients and have them work on it in place of [place]. Staff was also given 2 coloring sheets and 1 word search and were given the option to give them out.  Education:  Ability to follow Directions, Change of thought processes Discharge Planning.   Education Outcome: Acknowledges education/In group clarification offered  Clinical Observations/Feedback: . Due to COVID-19, guidelines group was not held. Group members were provided a learning activity worksheet to work on the topic and above-stated goals. LRT is available to answer any questions patient may have regarding the worksheet.  Tomi Likens, LRT/CTRS         Samantha Ashley L Oren Barella 10/17/2018 12:20 PM

## 2018-10-17 NOTE — [facility] Counselor (Signed)
Adult Comprehensive Assessment  Patient ID: Samantha Ashley, female   DOB: Jul 13, 1995, 23 y.o.   MRN: [id number]    Information Source: Information source: Patient  Current Stressors:  Patient states their primary concerns and needs for treatment are:: "My mom and my girlfriend put me in here. My girlfriend told my parents that I need help" Patient states their goals for this hospitilization and ongoing recovery are:: "I want to go home" Educational / Learning stressors: Pt denies stressors Employment / Job issues: Pt is unemployed Family Relationships: Pt reports her parents being her stressors. Pt reports that her parents put her here but haven't asked how she feels. Financial / Lack of resources (include bankruptcy): Pt denies stressors Housing / Lack of housing: Pt denies stressors Physical health (include injuries & life threatening diseases): Pt denies stressors Social relationships: Pt reports that she is upset that her girlfriend called her parents and that she is the reason she is in the hospital.  Substance abuse: Pt denies stressors. UDS positive for THC.  Bereavement / Loss: Pt denies stressors  Living/Environment/Situation:  Living Arrangements: Spouse/significant other Living conditions (as described by patient or guardian): "I love it. But we have to move out" Who else lives in the home?: Girlfriend How long has patient lived in current situation?: February of 2019 What is atmosphere in current home: Comfortable, Loving, Supportive  Family History:  Marital status: Long term relationship Long term relationship, how long?: March 24th, 2019 What types of issues is patient dealing with in the relationship?: Pt reports, "her sister" Are you sexually active?: Yes What is your sexual orientation?: Gay Has your sexual activity been affected by drugs, alcohol, medication, or emotional stress?: No Does patient have children?: No  Childhood History:  By whom was/is the  patient raised?: Both parents Description of patient's relationship with caregiver when they were a child: "Okay" Patient's description of current relationship with people who raised him/her: "Okay" How were you disciplined when you got in trouble as a child/adolescent?: "Yelled at and whopped" Does patient have siblings?: Yes Number of Siblings: 4 Description of patient's current relationship with siblings: "It's okay" Did patient suffer any verbal/emotional/physical/sexual abuse as a child?: No Did patient suffer from severe childhood neglect?: No Has patient ever been sexually abused/assaulted/raped as an adolescent or adult?: No Was the patient ever a victim of a crime or a disaster?: No Witnessed domestic violence?: No Has patient been effected by domestic violence as an adult?: No  Education:  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 Currently a student?: Yes Name of school: [employer] How long has the patient attended?: 1 year Learning disability?: No  Employment/Work Situation:   Employment situation: Unemployed Patient's job has been impacted by current illness: No What is the longest time patient has a held a job?: 1 year Where was the patient employed at that time?: [employer] Did You Receive Any Psychiatric Treatment/Services While in the [employer]?: No Are There Guns or Other Weapons in [place]?: No Are These Weapons Safely Secured?: Yes  Financial Resources:   Financial resources: Income from spouse, Private insurance Does patient have a representative payee or guardian?: No  Alcohol/Substance Abuse:   What has been your use of drugs/alcohol within the last 12 months?: Pt denies stressors. UDS positive for THC.  If attempted suicide, did drugs/alcohol play a role in this?: No Has alcohol/substance abuse ever caused legal problems?: No  Social Support System:   Patient's Community Support System: Good  Describe Community  Support System: Girlfriend and mom Type of faith/religion: Christianity How does patient's faith help to cope with current illness?: "It gives me something to cope with"  Leisure/Recreation:   Leisure and Hobbies: Singing and drawings  Strengths/Needs:   What is the patient's perception of their strengths?: Descalation and understanding Patient states they can use these personal strengths during their treatment to contribute to their recovery: "Be nice to people that want me to be here" Patient states these barriers may affect/interfere with their treatment: N/A Patient states these barriers may affect their return to the community: "My parents and my doctor" Other important information patient would like considered in planning for their treatment: N/A  Discharge Plan:   Currently receiving community mental health services: No Patient states concerns and preferences for aftercare planning are: Pt is requesting a black christian therapist that is a female. Pt states that her mother has a list of ones that she wants to pick from.  Patient states they will know when they are safe and ready for discharge when: "Now" Does patient have access to transportation?: Yes(girlfriend) Does patient have financial barriers related to discharge medications?: No Will patient be returning to same living situation after discharge?: Pt reports home but not sure with girlfriend because she is upset with her.   Summary/Recommendations:   Summary and Recommendations (to be completed by the evaluator): Pt is a 23 year old female who [facility] 10/13/2018. Pt's mother reports that the pt has been using cannabis "non-stop" since her discharge. Pt's mother reports that she has not been compliant with her medication regimen since her discharge. Pt's diagnosis is: Schizoaffective disorder, bipolar type (HCC). Recommendations for pt include: crisis stabilization, therapeutic milieu,  medication management, attend and participate   Delphia GratesJasmine M Shoshanna Mcquitty. 10/17/2018

## 2018-10-17 NOTE — Progress Notes (Signed)
Adult Psychoeducational Group Note  Date:  10/17/2018 Time:  9:19 PM  Group Topic/Focus:  Wrap-Up Group:   The focus of this group is to help patients review their daily goal of treatment and discuss progress on daily workbooks.  Participation Level:  Active  Participation Quality:  Appropriate  Affect:  Appropriate  Cognitive:  Oriented  Insight: Appropriate  Engagement in Group:  Engaged  Modes of Intervention:  Education and Support  Additional Comments:  Patient attended and participated in group tonight. She reports that today could have been better. She woke up in a good mood. She slept today. Spoke with her doctor and was given some medication for anxiety. Today she spoke with her wife which was the best part of her day.  Salley Scarlet [facility] 10/17/2018, 9:19 PM

## 2018-10-17 NOTE — Tx Team (Signed)
Interdisciplinary Treatment and Diagnostic Plan Update  10/17/2018 Time of Session: 09:40am Samantha Ashley MRN: [id number]  Principal Diagnosis: <principal problem not specified>  Secondary Diagnoses: Active Problems:   Bipolar depression (HCC)   Current Medications:  Current Facility-Administered Medications  Medication Dose Route Frequency Provider Last Rate Last Dose  . acetaminophen (TYLENOL) tablet 650 mg  650 mg Oral Q6H PRN Oneta RackLewis, Tanika N, NP      . alum & mag hydroxide-simeth (MAALOX/MYLANTA) 200-200-20 MG/5ML suspension 30 mL  30 mL Oral Q4H PRN Oneta RackLewis, Tanika N, NP      . benztropine (COGENTIN) tablet 1 mg  1 mg Oral BID Oneta RackLewis, Tanika N, NP   1 mg at 10/17/18 0802  . clonazePAM (KLONOPIN) tablet 1.5 mg  1.5 mg Oral TID Malvin JohnsFarah, Brian, MD      . divalproex (DEPAKOTE ER) 24 hr tablet 500 mg  500 mg Oral BID Oneta RackLewis, Tanika N, NP   500 mg at 10/17/18 0802  . LORazepam (ATIVAN) injection 2 mg  2 mg Intramuscular Q6H PRN Oneta RackLewis, Tanika N, NP   2 mg at 10/17/18 1428  . ziprasidone (GEODON) injection 20 mg  20 mg Intramuscular Q12H PRN Antonieta Pertlary, Greg Lawson, MD   20 mg at 10/17/18 1428   And  . LORazepam (ATIVAN) tablet 1 mg  1 mg Oral PRN Antonieta Pertlary, Greg Lawson, MD      . magnesium hydroxide (MILK OF MAGNESIA) suspension 30 mL  30 mL Oral Daily PRN Oneta RackLewis, Tanika N, NP      . omega-3 acid ethyl esters (LOVAZA) capsule 1 g  1 g Oral BID Oneta RackLewis, Tanika N, NP   1 g at 10/17/18 0802  . propranolol (INDERAL) tablet 20 mg  20 mg Oral BID Oneta RackLewis, Tanika N, NP   20 mg at 10/17/18 0802  . risperiDONE (RISPERDAL) tablet 3 mg  3 mg Oral TID Malvin JohnsFarah, Brian, MD      . traZODone (DESYREL) tablet 200 mg  200 mg Oral QHS Malvin JohnsFarah, Brian, MD       PTA Medications: Medications Prior to Admission  Medication Sig Dispense Refill Last Dose  . benztropine (COGENTIN) 1 MG tablet Take 1 tablet (1 mg total) by mouth 2 (two) times daily. (Patient not taking: Reported on 10/16/2018) 60 tablet 2 Not Taking at Unknown time  .  divalproex (DEPAKOTE ER) 500 MG 24 hr tablet 1 in am 2 at h s (Patient not taking: Reported on 10/16/2018) 90 tablet 2 Not Taking at Unknown time  . omega-3 acid ethyl esters (LOVAZA) 1 g capsule Take 1 capsule (1 g total) by mouth 2 (two) times daily. (Patient not taking: Reported on 10/16/2018) 60 capsule 2 Not Taking at Unknown time  . Prenatal Vit-Fe Fumarate-FA (PRENATAL MULTIVITAMIN) TABS tablet Take 1 tablet by mouth daily. (Patient not taking: Reported on 10/16/2018) 90 tablet 2 Not Taking at Unknown time  . propranolol (INDERAL) 40 MG tablet Take 1.5 tablets (60 mg total) by mouth 2 (two) times daily. (Patient not taking: Reported on 10/16/2018) 60 tablet 3 Not Taking at Unknown time  . risperiDONE (RISPERDAL) 3 MG tablet 1 in am and 2 at h s x 10 days then 2 at hs then on (Patient not taking: Reported on 10/16/2018) 70 tablet 2 Not Taking at Unknown time  . traZODone (DESYREL) 150 MG tablet Take 1 tablet (150 mg total) by mouth at bedtime as needed for sleep. (Patient not taking: Reported on 10/16/2018) 30 tablet 2 Not Taking at Unknown time  Patient Stressors: Medication change or noncompliance Substance abuse  Patient Strengths: Ability for insight Capable of independent living Supportive family/friends Work skills  Treatment Modalities: Medication Management, Group therapy, Case management,  1 to 1 session with clinician, Psychoeducation, Recreational therapy.   Physician Treatment Plan for Primary Diagnosis: <principal problem not specified> Long Term Goal(s): Improvement in symptoms so as ready for discharge Improvement in symptoms so as ready for discharge   Short Term Goals: Ability to identify changes in lifestyle to reduce recurrence of condition will improve Ability to verbalize feelings will improve Ability to disclose and discuss suicidal ideas Ability to demonstrate self-control will improve Ability to identify and develop effective coping behaviors will  improve Ability to maintain clinical measurements within normal limits will improve Compliance with prescribed medications will improve Ability to identify triggers associated with substance abuse/mental health issues will improve Ability to identify changes in lifestyle to reduce recurrence of condition will improve Ability to verbalize feelings will improve Ability to disclose and discuss suicidal ideas Ability to demonstrate self-control will improve Ability to identify and develop effective coping behaviors will improve Ability to maintain clinical measurements within normal limits will improve Compliance with prescribed medications will improve Ability to identify triggers associated with substance abuse/mental health issues will improve  Medication Management: Evaluate patient's response, side effects, and tolerance of medication regimen.  Therapeutic Interventions: 1 to 1 sessions, Unit Group sessions and Medication administration.  Evaluation of Outcomes: Progressing  Physician Treatment Plan for Secondary Diagnosis: Active Problems:   Bipolar depression (HCC)  Long Term Goal(s): Improvement in symptoms so as ready for discharge Improvement in symptoms so as ready for discharge   Short Term Goals: Ability to identify changes in lifestyle to reduce recurrence of condition will improve Ability to verbalize feelings will improve Ability to disclose and discuss suicidal ideas Ability to demonstrate self-control will improve Ability to identify and develop effective coping behaviors will improve Ability to maintain clinical measurements within normal limits will improve Compliance with prescribed medications will improve Ability to identify triggers associated with substance abuse/mental health issues will improve Ability to identify changes in lifestyle to reduce recurrence of condition will improve Ability to verbalize feelings will improve Ability to disclose and discuss  suicidal ideas Ability to demonstrate self-control will improve Ability to identify and develop effective coping behaviors will improve Ability to maintain clinical measurements within normal limits will improve Compliance with prescribed medications will improve Ability to identify triggers associated with substance abuse/mental health issues will improve     Medication Management: Evaluate patient's response, side effects, and tolerance of medication regimen.  Therapeutic Interventions: 1 to 1 sessions, Unit Group sessions and Medication administration.  Evaluation of Outcomes: Progressing   RN Treatment Plan for Primary Diagnosis: <principal problem not specified> Long Term Goal(s): Knowledge of disease and therapeutic regimen to maintain health will improve  Short Term Goals: Ability to participate in decision making will improve, Ability to verbalize feelings will improve, Ability to disclose and discuss suicidal ideas, Ability to identify and develop effective coping behaviors will improve and Compliance with prescribed medications will improve  Medication Management: RN will administer medications as ordered by provider, will assess and evaluate patient's response and provide education to patient for prescribed medication. RN will report any adverse and/or side effects to prescribing provider.  Therapeutic Interventions: 1 on 1 counseling sessions, Psychoeducation, Medication administration, Evaluate responses to treatment, Monitor vital signs and CBGs as ordered, Perform/monitor CIWA, COWS, AIMS and Fall Risk screenings as ordered, Perform  wound care treatments as ordered.  Evaluation of Outcomes: Progressing   LCSW Treatment Plan for Primary Diagnosis: <principal problem not specified> Long Term Goal(s): Safe transition to appropriate next level of care at discharge, Engage patient in therapeutic group addressing interpersonal concerns.  Short Term Goals: Engage patient in  aftercare planning with referrals and resources and Increase skills for wellness and recovery  Therapeutic Interventions: Assess for all discharge needs, 1 to 1 time with Social worker, Explore available resources and support systems, Assess for adequacy in community support network, Educate family and significant other(s) on suicide prevention, Complete Psychosocial Assessment, Interpersonal group therapy.  Evaluation of Outcomes: Progressing   Progress in Treatment: Attending groups: No. Participating in groups: No. Taking medication as prescribed: Yes. Toleration medication: Yes. Family/Significant other contact made: No, will contact:  will contact if given consent to contact Patient understands diagnosis: No. Discussing patient identified problems/goals with staff: Yes. Medical problems stabilized or resolved: Yes. Denies suicidal/homicidal ideation: Yes. Issues/concerns per patient self-inventory: No. Other:   New problem(s) identified: No, Describe:  None  New Short Term/Long Term Goal(s): Medication stabilization, elimination of SI thoughts, and development of a comprehensive mental wellness plan.   Patient Goals:  "To get me off of weed and go home"  Discharge Plan or Barriers: CSW will continue to follow up for appropriate referrals and possible discharge planning  Reason for Continuation of Hospitalization: Aggression Medication stabilization  Estimated Length of Stay: 2-3 days  Attendees: Patient: 10/17/2018   Physician: Dr. Johnn Hai, MD 10/17/2018   Nursing: Yetta Flock, RN 10/17/2018   RN Care Manager: 10/17/2018   Social Worker: Ardelle Anton, LCSW 10/17/2018   Recreational Therapist:  10/17/2018  Other:  10/17/2018   Other:  10/17/2018   Other: 10/17/2018     Scribe for Treatment Team: [name], LCSW 10/17/2018 2:41 PM

## 2018-10-18 NOTE — [facility] Suicide Risk Assessment (Signed)
[facility] INPATIENT:  Family/Significant Other Suicide Prevention Education  Suicide Prevention Education:  Contact Attempts: Pt's mother, lashaundra, lehrmann been identified by the patient as the family member/significant other with whom the patient will be residing, and identified as the person(s) who will aid the patient in the event of a mental health crisis.  With written consent from the patient, two attempts were made to provide suicide prevention education, prior to and/or following the patient's discharge.  We were unsuccessful in providing suicide prevention education.  A suicide education pamphlet was given to the patient to share with family/significant other.  Date and time of first attempt: 10/18/2018 @ 1:15pm Date and time of second attempt:  Samantha Ashley 10/18/2018, 1:19 PM

## 2018-10-18 NOTE — Progress Notes (Signed)
Pt currently asleep in bed. Respiration are even and unlabored. Pt in no sign of distress. Will continue to monitor.    [place] NOVEL CORONAVIRUS (COVID-19) DAILY CHECK-OFF SYMPTOMS - answer yes or no to each - every day NO YES  Have you had a fever in the past 24 hours?  . Fever (Temp > 37.80C / 100F) X   Have you had any of these symptoms in the past 24 hours? . New Cough .  Sore Throat  .  Shortness of Breath .  Difficulty Breathing .  Unexplained Body Aches   X   Have you had any one of these symptoms in the past 24 hours not related to allergies?   . Runny Nose .  Nasal Congestion .  Sneezing   X   If you have had runny nose, nasal congestion, sneezing in the past 24 hours, has it worsened?  X   EXPOSURES - check yes or no X   Have you traveled outside the state in the past 14 days?  X   Have you been in contact with someone with a confirmed diagnosis of COVID-19 or PUI in the past 14 days without wearing appropriate PPE?  X   Have you been living in the same home as a person with confirmed diagnosis of COVID-19 or a PUI (household contact)?    X   Have you been diagnosed with COVID-19?    X              What to do next: Answered NO to all: Answered YES to anything:   Proceed with unit schedule Follow the [facility] Inpatient Flowsheet.    

## 2018-10-18 NOTE — [facility] Group Notes (Signed)
[facility] LCSW Group Therapy Note  Date/Time: 10/18/2018 @ 11am  Type of Therapy and Topic:  Group Therapy:  Overcoming Obstacles  Participation Level:  Active   Description of Group:    In this group patients will be encouraged to explore what they see as obstacles to their own wellness and recovery. They will be guided to discuss their thoughts, feelings, and behaviors related to these obstacles. The group will process together ways to cope with barriers, with attention given to specific choices patients can make. Each patient will be challenged to identify changes they are motivated to make in order to overcome their obstacles. This group will be process-oriented, with patients participating in exploration of their own experiences as well as giving and receiving support and challenge from other group members.  Therapeutic Goals: 1. Patient will identify personal and current obstacles as they relate to admission. 2. Patient will identify barriers that currently interfere with their wellness or overcoming obstacles.  3. Patient will identify feelings, thought process and behaviors related to these barriers. 4. Patient will identify two changes they are willing to make to overcome these obstacles:    Summary of Patient Progress   Pt was engaged and active throughout group. Pt was able to identify an obstacle that she is trying to overcome which is her family and her girlfriend. Pt was able to identify some barriers that she has when it comes to her obstacle which she states, "weed being illegal here in [place]" because she states that she uses marijuana to cope. Pt also states that her girlfriend is a barrier because she is naturally an angry person. Pt was able to identify two changes that she is willing to make to overcome these obstacles which are: she is trying to call her mom and girlfriend more while she is here and reading her bible helps her connect more with God and feel better. Pt was encouraging  of other group members during group.    Therapeutic Modalities:   Cognitive Behavioral Therapy Solution Focused Therapy Motivational Interviewing Relapse Prevention Therapy   Ardelle Anton, LCSW

## 2018-10-18 NOTE — Progress Notes (Signed)
Patient ID: Samantha Ashley, female   DOB: 09/08/1995, 22 y.o.   MRN: [id number]   [place] NOVEL CORONAVIRUS (COVID-19) DAILY CHECK-OFF SYMPTOMS - answer yes or no to each - every day NO YES  Have you had a fever in the past 24 hours?  . Fever (Temp > 37.80C / 100F) X   Have you had any of these symptoms in the past 24 hours? . New Cough .  Sore Throat  .  Shortness of Breath .  Difficulty Breathing .  Unexplained Body Aches   X   Have you had any one of these symptoms in the past 24 hours not related to allergies?   . Runny Nose .  Nasal Congestion .  Sneezing   X   If you have had runny nose, nasal congestion, sneezing in the past 24 hours, has it worsened?  X   EXPOSURES - check yes or no X   Have you traveled outside the state in the past 14 days?  X   Have you been in contact with someone with a confirmed diagnosis of COVID-19 or PUI in the past 14 days without wearing appropriate PPE?  X   Have you been living in the same home as a person with confirmed diagnosis of COVID-19 or a PUI (household contact)?    X   Have you been diagnosed with COVID-19?    X              What to do next: Answered NO to all: Answered YES to anything:   Proceed with unit schedule Follow the [facility] Inpatient Flowsheet.   

## 2018-10-18 NOTE — Progress Notes (Signed)
Recreation Therapy Notes  Date: 10/18/2018 Time: 10:00 am Location: [address]   Group Topic: Goal Planning.  Goal Area(s) Addresses:  Patient will work on worksheet on Goal Planning. Patient will follow directions on first prompt.  Behavioral Response: Appropriate  Intervention: Worksheet  Activity:  Staff on [address] were provided with a worksheet on Goal Planning. Staff was instructed to give it to the patients and have them work on it in place of Recreation Therapy Group. Staff was also given 2 coloring sheets and 1 word search and were given the option to give them out.  Education:  Ability to follow Directions, Change of thought processes Discharge Planning, Goal Planning.   Education Outcome: Acknowledges education/In group clarification offered  Clinical Observations/Feedback: . Due to COVID-19, guidelines group was not held. Group members were provided a learning activity worksheet to work on the topic and above-stated goals. LRT is available to answer any questions patient may have regarding the worksheet.  Coyt Govoni L Kazzandra Desaulniers, LRT/CTRS         [place] Knee L Ester Mabe 10/18/2018 4:09 PM 

## 2018-10-18 NOTE — Progress Notes (Signed)
[facility] MD Progress Note  10/18/2018 9:45 AM Samantha Ashley  MRN:  [id number] Subjective:   More contained behaviorally and mentally than yesterday focused on discharge compliant with meds less focused on other patients in their issues.  She is alert oriented to person place situation very focused on "discharge planning" I told her we would see how the week goes.  No acute auditory or visual hallucinations.  Parents fearful of rapid discharge and fearful of relapse on cannabis Principal Problem: Exacerbation and underlying bipolar/psychotic type condition Diagnosis: Active Problems:   Bipolar depression ([place])  Total Time spent with patient: 20 minutes Past Medical History:  Past Medical History:  Diagnosis Date  . Asthma    History reviewed. No pertinent surgical history. Family History: History reviewed. No pertinent family history. Family Psychiatric  History: neg Social History:  Social History   Substance and Sexual Activity  Alcohol Use No     Social History   Substance and Sexual Activity  Drug Use Yes  . Types: Marijuana    Social History   Socioeconomic History  . Marital status: Single    Spouse name: Not on file  . Number of children: Not on file  . Years of education: Not on file  . Highest education level: Not on file  Occupational History  . Not on file  Social Needs  . Financial resource strain: Not on file  . Food insecurity    Worry: Not on file    Inability: Not on file  . Transportation needs    Medical: Not on file    Non-medical: Not on file  Tobacco Use  . Smoking status: Current Every Day Smoker    Types: Cigars  . Smokeless tobacco: Never Used  Substance and Sexual Activity  . Alcohol use: No  . Drug use: Yes    Types: Marijuana  . Sexual activity: Yes  Lifestyle  . Physical activity    Days per week: Not on file    Minutes per session: Not on file  . Stress: Not on file  Relationships  . Social Herbalist on phone: Not on  file    Gets together: Not on file    Attends religious service: Not on file    Active member of club or organization: Not on file    Attends meetings of clubs or organizations: Not on file    Relationship status: Not on file  Other Topics Concern  . Not on file  Social History Narrative  . Not on file   Additional Social History:    Pain Medications: See MAR Prescriptions: See MAR Over the Counter: See MAR History of alcohol / drug use?: Yes Longest period of sobriety (when/how long): Unknown Negative Consequences of Use: (Denies) Withdrawal Symptoms: (Denies) Name of Substance 1: Cannabis per hx 1 - Age of First Use: UTA 1 - Amount (size/oz): UTA 1 - Frequency: UTA 1 - Duration: UTA 1 - Last Use / Amount: UTA per hx                  Sleep: Good  Appetite:  Good  Current Medications: Current Facility-Administered Medications  Medication Dose Route Frequency Provider Last Rate Last Dose  . acetaminophen (TYLENOL) tablet 650 mg  650 mg Oral Q6H PRN Derrill Center, NP      . alum & mag hydroxide-simeth (MAALOX/MYLANTA) 200-200-20 MG/5ML suspension 30 mL  30 mL Oral Q4H PRN Derrill Center, NP      .  benztropine (COGENTIN) tablet 1 mg  1 mg Oral BID Oneta RackLewis, Tanika N, NP   1 mg at 10/18/18 0759  . clonazePAM (KLONOPIN) tablet 1.5 mg  1.5 mg Oral TID Malvin JohnsFarah, Salahuddin Arismendez, MD   1.5 mg at 10/18/18 0804  . divalproex (DEPAKOTE ER) 24 hr tablet 500 mg  500 mg Oral BID Oneta RackLewis, Tanika N, NP   500 mg at 10/18/18 0804  . LORazepam (ATIVAN) injection 2 mg  2 mg Intramuscular Q6H PRN Oneta RackLewis, Tanika N, NP   2 mg at 10/17/18 1428  . ziprasidone (GEODON) injection 20 mg  20 mg Intramuscular Q12H PRN Antonieta Pertlary, Greg Lawson, MD   20 mg at 10/17/18 1428   And  . LORazepam (ATIVAN) tablet 1 mg  1 mg Oral PRN Antonieta Pertlary, Greg Lawson, MD      . magnesium hydroxide (MILK OF MAGNESIA) suspension 30 mL  30 mL Oral Daily PRN Oneta RackLewis, Tanika N, NP      . omega-3 acid ethyl esters (LOVAZA) capsule 1 g  1 g Oral BID  Oneta RackLewis, Tanika N, NP   1 g at 10/18/18 0759  . propranolol (INDERAL) tablet 20 mg  20 mg Oral BID Oneta RackLewis, Tanika N, NP   20 mg at 10/18/18 0759  . risperiDONE (RISPERDAL) tablet 3 mg  3 mg Oral TID Malvin JohnsFarah, Nation Cradle, MD   3 mg at 10/18/18 0759  . traZODone (DESYREL) tablet 200 mg  200 mg Oral QHS Malvin JohnsFarah, Daxx Tiggs, MD   200 mg at 10/17/18 2052    Lab Results:  Results for orders placed or performed during the hospital encounter of 10/15/18 (from the past 48 hour(s))  Pregnancy, urine     Status: None   Collection Time: 10/16/18  2:41 PM  Result Value Ref Range   Preg Test, Ur NEGATIVE NEGATIVE    Comment:        THE SENSITIVITY OF THIS METHODOLOGY IS >20 mIU/mL. Performed at [facility], [address] [place], [place] [address]   Valproic acid level     Status: Abnormal   Collection Time: 10/17/18  6:28 AM  Result Value Ref Range   Valproic Acid Lvl 27 (L) 50.0 - 100.0 ug/mL    Comment: Performed at [facility], [address] [place], [place] [address]    Blood Alcohol level:  Lab Results  Component Value Date   ETH <10 10/10/2018    Metabolic Disorder Labs: Lab Results  Component Value Date   HGBA1C 4.9 10/11/2018   MPG 93.93 10/11/2018   Lab Results  Component Value Date   PROLACTIN 52.5 (H) 10/11/2018   Lab Results  Component Value Date   CHOL 103 10/11/2018   TRIG 77 10/11/2018   HDL 21 (L) 10/11/2018   CHOLHDL 4.9 10/11/2018   VLDL 15 10/11/2018   LDLCALC 67 10/11/2018    Physical Findings: AIMS: Facial and Oral Movements Muscles of Facial Expression: None, normal Lips and Perioral Area: None, normal Jaw: None, normal Tongue: None, normal,Extremity Movements Upper (arms, wrists, hands, fingers): None, normal Lower (legs, knees, ankles, toes): None, normal, Trunk Movements Neck, shoulders, hips: None, normal, Overall Severity Severity of abnormal movements (highest score from questions above): None, normal Incapacitation  due to abnormal movements: None, normal Patient's awareness of abnormal movements (rate only patient's report): No Awareness, Dental Status Current problems with teeth and/or dentures?: No Does patient usually wear dentures?: No  CIWA:  CIWA-Ar Total: 0 COWS:  COWS Total Score: 1  Musculoskeletal: Strength &  Muscle Tone: within normal limits Gait & Station: normal Patient leans: N/A  Psychiatric Specialty Exam: Physical Exam  ROS  Blood pressure 106/79, pulse 86, temperature 98.2 F (36.8 C), temperature source Oral, resp. rate 16, height 5\' 4"  (1.626 m), weight 109 kg, SpO2 100 %.Body mass index is 41.25 kg/m.  General Appearance: Casual  Eye Contact:  Good  Speech:  Clear and Coherent  Volume:  Normal  Mood:  Euthymic  Affect:  Congruent  Thought Process:  Goal Directed  Orientation:  Full (Time, Place, and Person)  Thought Content:  Logical and Tangential  Suicidal Thoughts:  No  Homicidal Thoughts:  No  Memory:  Immediate;   Fair  Judgement:  Fair  Insight:  Fair  Psychomotor Activity:  Normal  Concentration:  Concentration: Fair  Recall:  [employer]  Fund of Knowledge:  Fair  Language:  Fair  Akathisia:  Negative  Handed:  Right  AIMS (if indicated):     Assets:  Physical Health Resilience  ADL's:  Intact  Cognition:  WNL  Sleep:  Number of Hours: 5     Treatment Plan Summary: Daily contact with patient to assess and evaluate symptoms and progress in treatment and Medication management continue reality-based therapy current antipsychotic and mood stabilizer therapy current neuroprotective measures probable discharge towards the end of the week.  Malvin JohnsFARAH,Tiasia Weberg, MD 10/18/2018, 9:45 AM

## 2018-10-18 NOTE — Progress Notes (Signed)
The focus of this group is to help patients establish daily goals to achieve during treatment and discuss how the patient can incorporate goal setting into their daily lives to aide in recovery. 

## 2018-10-18 NOTE — Plan of Care (Signed)
D: Pt alert and oriented on the unit. Pt engaging with RN staff and other pts. Pt denies SI/HI, A/VH. Pt became upset after talking with her parents on the phone. Pt was yelling and cursing during the phone call.  Pt redirectable. Pt participated during group activities.  A: Education, support and encouragement provided, q15 minute safety checks remain in effect. Medications administered per MD orders. R: No reactions/side effects to medicine noted. Pt denies any concerns at this time, and verbally contracts for safety. Pt ambulating on the unit with no issues. Pt remains safe on and off the unit.   Problem: Activity: Goal: Will verbalize the importance of balancing activity with adequate rest periods Outcome: Progressing

## 2018-10-19 MED ORDER — PROPRANOLOL HCL 10 MG PO TABS
10.0000 mg | ORAL_TABLET | Freq: Two times a day (BID) | ORAL | Status: DC
  Administered 2018-10-19 – 2018-10-21 (×4): 10 mg via ORAL
  Filled 2018-10-19 (×11): qty 1

## 2018-10-19 NOTE — [facility] Group Notes (Signed)
Occupational Therapy Group Note  Date:  10/19/2018 Time:  9:23 PM  Group Topic/Focus:  Leisure Group  Participation Level:  Active  Participation Quality:  Attentive  Affect:  Animated  Cognitive:  Alert  Insight: Lacking  Engagement in Group:  Engaged  Modes of Intervention:  Activity, Discussion, Education and Socialization  Additional Comments:    S: "I want to get my degree in psychology"  O: OT group focus on leisure this date, while incorporating coping skills to ensure understanding. Pts to play game of Uno: and name a struggle they're facing, a communication skill, something they like about yourself, stress management, and a healthy way to manage anger per certain cards played. Pt also assessed for attention, ability to follow rules, and temperament with rules.   A: Pt presents with animated affect, very giddy and bubbly. When seeing OT (had met previous week) she states "oh my Lamont Snowball, oh my gosh it's you! You're here!" She is very Film/video editor and giddy throughout all of group, attempting to make connections with others, some in nonsensical ways. She starts to become tangential and sharing at any moment available. Overall appropriate with rules.  P: OT group will be x1 per week while pt inpatient.  Zenovia Jarred, MSOT, OTR/L Behavioral Health OT/ Acute Relief OT PHP Office: [place] 10/19/2018, 9:23 PM

## 2018-10-19 NOTE — Plan of Care (Signed)
Progress note  D: pt found at the nurses station; compliant with medication administration. Pt is pleasant but fixated on discharge and speaking to the provider. Pt still is in denial in about being IVC'd. Pt states last time she was here she left in 4 days and doesn't understand why this time it's longer. Pt provided education. No evidence of learning. Pt denies si/hi/ah/vh and verbally agrees to approach staff if these become apparent or before harming herself/others while at [place]. A: pt provided support and encouragement. Pt given medication per protocol and standing orders. Q69m safety checks implemented and continued.  R: pt safe on the unit. Will continue to monitor.   Pt progressing in the following metrics  Problem: Health Behavior/Discharge Planning: Goal: Compliance with prescribed medication regimen will improve Outcome: Progressing   Problem: Nutritional: Goal: Ability to achieve adequate nutritional intake will improve Outcome: Progressing   Problem: Role Relationship: Goal: Ability to communicate needs accurately will improve Outcome: Progressing Goal: Ability to interact with others will improve Outcome: Progressing

## 2018-10-19 NOTE — Progress Notes (Signed)
Nursing Progress Note: 7p-7a D: Pt currently presents with a euthymic/superficial/pleasant/anxious affect and behavior. Interacting appropriately with the milieu. Pt reports poor sleep during the previous night.  A: Pt provided with medications per providers orders. Pt's labs and vitals were monitored throughout the night. Pt supported emotionally and encouraged to express concerns and questions. Pt educated on medications.  R: Pt's safety ensured with 15 minute and environmental checks. Pt currently denies SI, HI, and AVH. Pt verbally contracts to seek staff if SI,HI, or AVH occurs and to consult with staff before acting on any harmful thoughts. Will continue to monitor.   [employer] NOVEL CORONAVIRUS (COVID-19) DAILY CHECK-OFF SYMPTOMS - answer yes or no to each - every day NO YES  Have you had a fever in the past 24 hours?  . Fever (Temp > 37.80C / 100F) X   Have you had any of these symptoms in the past 24 hours? . New Cough .  Sore Throat  .  Shortness of Breath .  Difficulty Breathing .  Unexplained Body Aches   X   Have you had any one of these symptoms in the past 24 hours not related to allergies?   . Runny Nose .  Nasal Congestion .  Sneezing   X   If you have had runny nose, nasal congestion, sneezing in the past 24 hours, has it worsened?  X   EXPOSURES - check yes or no X   Have you traveled outside the state in the past 14 days?  X   Have you been in contact with someone with a confirmed diagnosis of COVID-19 or PUI in the past 14 days without wearing appropriate PPE?  X   Have you been living in the same home as a person with confirmed diagnosis of COVID-19 or a PUI (household contact)?    X   Have you been diagnosed with COVID-19?    X              What to do next: Answered NO to all: Answered YES to anything:   Proceed with unit schedule Follow the [facility] Inpatient Flowsheet.

## 2018-10-19 NOTE — Progress Notes (Signed)
Recreation Therapy Notes  Date: 10/19/2018 Time: 10:00 am Location: [address] hall   Group Topic: Anger Triggers and Management  Goal Area(s) Addresses:  Patient will work on worksheet on Anger Triggers and Management. Patient will follow directions on first prompt.  Behavioral Response: Appropriate  Intervention: Worksheet  Activity:  Staff on [address] hall were provided with a worksheet on Anger Triggers and Management. Staff was instructed to give it to the patients and have them work on it in place of Samantha Ashley. Staff was also given 2 coloring sheets and 2 word search and were given the option to give them out.  Education:  Ability to follow Directions, Change of thought processes Discharge Planning, Goal Planning.   Education Outcome: Acknowledges education/In group clarification offered  Clinical Observations/Feedback: . Due to COVID-19, guidelines group was not held. Group members were provided a learning activity worksheet to work on the topic and above-stated goals. LRT is available to answer any questions patient may have regarding the worksheet.  Tomi Likens, LRT/CTRS         Samantha Ashley L Samantha Ashley 10/19/2018 1:12 PM

## 2018-10-19 NOTE — Progress Notes (Signed)
[facility]  10/19/2018 11:22 AM Samantha Ashley  MRN:  [id number] Subjective:   Pt seen- rambles less- more focused- yet requests discharge- No changes in meds today/more coherent and more reality based Parents fear speedy discharge-  No eps no eps Principal Problem: schizoaffective- Diagnosis: Active Problems:   Bipolar depression ([facility])  Total Time spent with patient: 20 minutes  Past Medical History:  Past Medical History:  Diagnosis Date  . Asthma    History reviewed. No pertinent surgical history. Family History: History reviewed. No pertinent family history.  Social History:  Social History   Substance and Sexual Activity  Alcohol Use No     Social History   Substance and Sexual Activity  Drug Use Yes  . Types: Marijuana    Social History   Socioeconomic History  . Marital status: Single    Spouse name: Not on file  . Number of children: Not on file  . Years of education: Not on file  . Highest education level: Not on file  Occupational History  . Not on file  Social Needs  . Financial resource strain: Not on file  . Food insecurity    Worry: Not on file    Inability: Not on file  . Transportation needs    Medical: Not on file    Non-medical: Not on file  Tobacco Use  . Smoking status: Current Every Day Smoker    Types: Cigars  . Smokeless tobacco: Never Used  Substance and Sexual Activity  . Alcohol use: No  . Drug use: Yes    Types: Marijuana  . Sexual activity: Yes  Lifestyle  . Physical activity    Days per week: Not on file    Minutes per session: Not on file  . Stress: Not on file  Relationships  . Social Musicianconnections    Talks on phone: Not on file    Gets together: Not on file    Attends religious service: Not on file    Active member of club or organization: Not on file    Attends meetings of clubs or organizations: Not on file    Relationship status: Not on file  Other Topics Concern  . Not on file  Social History  Narrative  . Not on file   Additional Social History:    Pain Medications: See MAR Prescriptions: See MAR Over the Counter: See MAR History of alcohol / drug use?: Yes Longest period of sobriety (when/how long): Unknown Negative Consequences of Use: (Denies) Withdrawal Symptoms: (Denies) Name of Substance 1: Cannabis per hx 1 - Age of First Use: UTA 1 - Amount (size/oz): UTA 1 - Frequency: UTA 1 - Duration: UTA 1 - Last Use / Amount: UTA per hx                  Sleep: Fair  Appetite:  Fair  Current Medications: Current Facility-Administered Medications  Medication Dose Route Frequency Provider Last Rate Last Dose  . acetaminophen (TYLENOL) tablet 650 mg  650 mg Oral Q6H PRN Oneta RackLewis, Tanika N, NP      . alum & mag hydroxide-simeth (MAALOX/MYLANTA) 200-200-20 MG/5ML suspension 30 mL  30 mL Oral Q4H PRN Oneta RackLewis, Tanika N, NP      . benztropine (COGENTIN) tablet 1 mg  1 mg Oral BID Oneta RackLewis, Tanika N, NP   1 mg at 10/19/18 0737  . clonazePAM (KLONOPIN) tablet 1.5 mg  1.5 mg Oral TID Malvin JohnsFarah, Moneisha Vosler, MD   1.5 mg at  10/19/18 0736  . divalproex (DEPAKOTE ER) 24 hr tablet 500 mg  500 mg Oral BID Derrill Center, NP   500 mg at 10/19/18 0736  . LORazepam (ATIVAN) injection 2 mg  2 mg Intramuscular Q6H PRN Derrill Center, NP   2 mg at 10/18/18 1613  . ziprasidone (GEODON) injection 20 mg  20 mg Intramuscular Q12H PRN Sharma Covert, MD   20 mg at 10/17/18 1428   And  . LORazepam (ATIVAN) tablet 1 mg  1 mg Oral PRN Sharma Covert, MD      . magnesium hydroxide (MILK OF MAGNESIA) suspension 30 mL  30 mL Oral Daily PRN Derrill Center, NP      . omega-3 acid ethyl esters (LOVAZA) capsule 1 g  1 g Oral BID Derrill Center, NP   1 g at 10/19/18 0737  . propranolol (INDERAL) tablet 10 mg  10 mg Oral BID Johnn Hai, MD      . risperiDONE (RISPERDAL) tablet 3 mg  3 mg Oral TID Johnn Hai, MD   3 mg at 10/19/18 0737  . traZODone (DESYREL) tablet 200 mg  200 mg Oral QHS Johnn Hai, MD    200 mg at 10/17/18 2052    Lab Results: No results found for this or any previous visit (from the past 48 hour(s)).  Blood Alcohol level:  Lab Results  Component Value Date   ETH <10 37/90/2409    Metabolic Disorder Labs: Lab Results  Component Value Date   HGBA1C 4.9 10/11/2018   MPG 93.93 10/11/2018   Lab Results  Component Value Date   PROLACTIN 52.5 (H) 10/11/2018   Lab Results  Component Value Date   CHOL 103 10/11/2018   TRIG 77 10/11/2018   HDL 21 (L) 10/11/2018   CHOLHDL 4.9 10/11/2018   VLDL 15 10/11/2018   LDLCALC 67 10/11/2018    Physical Findings: AIMS: Facial and Oral Movements Muscles of Facial Expression: None, normal Lips and Perioral Area: None, normal Jaw: None, normal Tongue: None, normal,Extremity Movements Upper (arms, wrists, hands, fingers): None, normal Lower (legs, knees, ankles, toes): None, normal, Trunk Movements Neck, shoulders, hips: None, normal, Overall Severity Severity of abnormal movements (highest score from questions above): None, normal Incapacitation due to abnormal movements: None, normal Patient's awareness of abnormal movements (rate only patient's report): No Awareness, Dental Status Current problems with teeth and/or dentures?: No Does patient usually wear dentures?: No  CIWA:  CIWA-Ar Total: 0 COWS:  COWS Total Score: 1  Musculoskeletal: Strength & Muscle Tone: within normal limits Gait & Station: normal Patient leans: N/A  Psychiatric Specialty Exam: Physical Exam  ROS  Blood pressure 99/73, pulse (!) 101, temperature 98.2 F (36.8 C), temperature source Oral, resp. rate 16, height 5\' 4"  (1.626 m), weight 109 kg, SpO2 100 %.Body mass index is 41.25 kg/m.  General Appearance: Casual  Eye Contact:  Good  Speech:  Clear and Coherent  Volume:  Normal  Mood:  hypomanic  Affect:  Congruent  Thought Process:  Coherent, Linear and Descriptions of Associations: Tangential  Orientation:  Full (Time, Place, and  Person)  Thought Content:  Logical and Tangential  Suicidal Thoughts:  No  Homicidal Thoughts:  No  Memory:  Immediate;   Fair  Judgement:  Fair  Insight:  Fair  Psychomotor Activity:  Normal  Concentration:  Concentration: Fair  Recall:  [employer] of Knowledge:  Fair  Language:  Fair  Akathisia:  Negative  Handed:  Right  AIMS (if indicated):     Assets:  Communication Skills Desire for Improvement  ADL's:  Intact  Cognition:  WNL  Sleep:  Number of Hours: 4.5     Treatment Plan Summary: Daily contact with patient to assess and evaluate symptoms and progress in treatment and Medication management tinea reality based therapy current antipsychotic therapy again parents resist a too rapid discharge monitor to the weekend and discussed with team  Malvin JohnsFARAH,Akito Boomhower, MD 10/19/2018, 11:22 AM

## 2018-10-20 NOTE — Plan of Care (Signed)
Progress note  D: pt found in bed; compliant with medication administration. Pt denies any physical complaints. Pt continues to be confrontational with staff and other patients. Pt has agitated multiple patients disrupting the milieu. Pt continues to staff split, threaten, and be verbally abusive. Pt states that multiple staff members are her "triggers" and she can "pull it" at any time. Pt continues to be verbally aggressive towards family members. Pt denies si/hi/ah/vh and verbally agrees to approach staff if these become apparent or before harming himself/others while at [place].  A: pt provided support and encouragement. Pt given medication per protocol and standing orders. Q62m safety checks implemented and continued.  R: pt safe on the unit. Will continue to monitor.   Pt progressing in the following metrics  Problem: Safety: Goal: Ability to redirect hostility and anger into socially appropriate behaviors will improve Outcome: Progressing Goal: Ability to remain free from injury will improve Outcome: Progressing   Problem: Self-Care: Goal: Ability to participate in self-care as condition permits will improve Outcome: Progressing   Problem: Self-Concept: Goal: Will verbalize positive feelings about self Outcome: Progressing

## 2018-10-20 NOTE — Progress Notes (Signed)
[facility]  10/20/2018 1:49 PM Samantha Ashley  MRN:  [id number] Subjective:    Patient very focused on discharge family describes good moments and rough moments patient was agitated when she learned she was not to be discharged yesterday.  Is currently alert oriented focused again on discharge but denies thoughts of harming self or others.  We will make minor adjustments probably discharge tomorrow may need long-acting injectable again has not committed to stay cannabis free which is key to her recovery  Principal Problem:  Diagnosis: Active Problems:   Bipolar depression (HCC)  Total Time spent with patient: 20 minutes  Past Medical History:  Past Medical History:  Diagnosis Date  . Asthma    History reviewed. No pertinent surgical history. Family History: History reviewed. No pertinent family history.  Social History:  Social History   Substance and Sexual Activity  Alcohol Use No     Social History   Substance and Sexual Activity  Drug Use Yes  . Types: Marijuana    Social History   Socioeconomic History  . Marital status: Single    Spouse name: Not on file  . Number of children: Not on file  . Years of education: Not on file  . Highest education level: Not on file  Occupational History  . Not on file  Social Needs  . Financial resource strain: Not on file  . Food insecurity    Worry: Not on file    Inability: Not on file  . Transportation needs    Medical: Not on file    Non-medical: Not on file  Tobacco Use  . Smoking status: Current Every Day Smoker    Types: Cigars  . Smokeless tobacco: Never Used  Substance and Sexual Activity  . Alcohol use: No  . Drug use: Yes    Types: Marijuana  . Sexual activity: Yes  Lifestyle  . Physical activity    Days per week: Not on file    Minutes per session: Not on file  . Stress: Not on file  Relationships  . Social Musicianconnections    Talks on phone: Not on file    Gets together: Not on file   Attends religious service: Not on file    Active member of club or organization: Not on file    Attends meetings of clubs or organizations: Not on file    Relationship status: Not on file  Other Topics Concern  . Not on file  Social History Narrative  . Not on file   Additional Social History:    Pain Medications: See MAR Prescriptions: See MAR Over the Counter: See MAR History of alcohol / drug use?: Yes Longest period of sobriety (when/how long): Unknown Negative Consequences of Use: (Denies) Withdrawal Symptoms: (Denies) Name of Substance 1: Cannabis per hx 1 - Age of First Use: UTA 1 - Amount (size/oz): UTA 1 - Frequency: UTA 1 - Duration: UTA 1 - Last Use / Amount: UTA per hx                  Sleep: Good  Appetite:  Good  Current Medications: Current Facility-Administered Medications  Medication Dose Route Frequency Provider Last Rate Last Dose  . acetaminophen (TYLENOL) tablet 650 mg  650 mg Oral Q6H PRN Oneta RackLewis, Tanika N, NP   650 mg at 10/20/18 0754  . alum & mag hydroxide-simeth (MAALOX/MYLANTA) 200-200-20 MG/5ML suspension 30 mL  30 mL Oral Q4H PRN Oneta RackLewis, Tanika N, NP      .  benztropine (COGENTIN) tablet 1 mg  1 mg Oral BID Oneta RackLewis, Tanika N, NP   1 mg at 10/20/18 0754  . divalproex (DEPAKOTE ER) 24 hr tablet 500 mg  500 mg Oral BID Oneta RackLewis, Tanika N, NP   500 mg at 10/20/18 0754  . LORazepam (ATIVAN) injection 2 mg  2 mg Intramuscular Q6H PRN Oneta RackLewis, Tanika N, NP   2 mg at 10/18/18 1613  . ziprasidone (GEODON) injection 20 mg  20 mg Intramuscular Q12H PRN Antonieta Pertlary, Greg Lawson, MD   20 mg at 10/17/18 1428   And  . LORazepam (ATIVAN) tablet 1 mg  1 mg Oral PRN Antonieta Pertlary, Greg Lawson, MD      . magnesium hydroxide (MILK OF MAGNESIA) suspension 30 mL  30 mL Oral Daily PRN Oneta RackLewis, Tanika N, NP      . omega-3 acid ethyl esters (LOVAZA) capsule 1 g  1 g Oral BID Oneta RackLewis, Tanika N, NP   1 g at 10/19/18 1722  . propranolol (INDERAL) tablet 10 mg  10 mg Oral BID Malvin JohnsFarah, Dace Denn, MD    10 mg at 10/20/18 0755  . risperiDONE (RISPERDAL) tablet 3 mg  3 mg Oral TID Malvin JohnsFarah, Alveena Taira, MD   3 mg at 10/20/18 1155  . traZODone (DESYREL) tablet 200 mg  200 mg Oral QHS Malvin JohnsFarah, Leith Hedlund, MD   200 mg at 10/19/18 2040    Lab Results: No results found for this or any previous visit (from the past 48 hour(s)).  Blood Alcohol level:  Lab Results  Component Value Date   ETH <10 10/10/2018    Metabolic Disorder Labs: Lab Results  Component Value Date   HGBA1C 4.9 10/11/2018   MPG 93.93 10/11/2018   Lab Results  Component Value Date   PROLACTIN 52.5 (H) 10/11/2018   Lab Results  Component Value Date   CHOL 103 10/11/2018   TRIG 77 10/11/2018   HDL 21 (L) 10/11/2018   CHOLHDL 4.9 10/11/2018   VLDL 15 10/11/2018   LDLCALC 67 10/11/2018    Physical Findings: AIMS: Facial and Oral Movements Muscles of Facial Expression: None, normal Lips and Perioral Area: None, normal Jaw: None, normal Tongue: None, normal,Extremity Movements Upper (arms, wrists, hands, fingers): None, normal Lower (legs, knees, ankles, toes): None, normal, Trunk Movements Neck, shoulders, hips: None, normal, Overall Severity Severity of abnormal movements (highest score from questions above): None, normal Incapacitation due to abnormal movements: None, normal Patient's awareness of abnormal movements (rate only patient's report): No Awareness, Dental Status Current problems with teeth and/or dentures?: No Does patient usually wear dentures?: No  CIWA:  CIWA-Ar Total: 0 COWS:  COWS Total Score: 1  Musculoskeletal: Strength & Muscle Tone: within normal limits Gait & Station: normal Patient leans: N/A  Psychiatric Specialty Exam: Physical Exam  ROS  Blood pressure (!) 112/93, pulse (!) 129, temperature 98.2 F (36.8 C), temperature source Oral, resp. rate 16, height 5\' 4"  (1.626 m), weight 109 kg, SpO2 100 %.Body mass index is 41.25 kg/m.  General Appearance: Casual  Eye Contact:  Good  Speech:  Clear  and Coherent  Volume:  Normal  Mood:  variable  Affect:  Congruent  Thought Process:  Linear and Descriptions of Associations: Loose  Orientation:  Full (Time, Place, and Person)  Thought Content:  Logical and Tangential  Suicidal Thoughts:  No  Homicidal Thoughts:  No  Memory:  Immediate;   Good  Judgement:  Good  Insight:  Fair  Psychomotor Activity:  Normal  Concentration:  Concentration: Fair  Recall:  Smiley Houseman of Knowledge:  Fair  Language:  Good  Akathisia:  Negative  Handed:  Right  AIMS (if indicated):     Assets:  Communication Skills Desire for Improvement Housing Leisure Time Physical Health Resilience Talents/Skills Transportation  ADL's:  Intact  Cognition:  WNL  Sleep:  Number of Hours: 6.75     Treatment Plan Summary: Daily contact with patient to assess and evaluate symptoms and progress in treatment and Medication management- cont current meds- will discuss discharge further- No change in precautions  Gokul Waybright, MD 10/20/2018, 1:49 PM

## 2018-10-20 NOTE — Progress Notes (Signed)
Recreation Therapy Notes  Date: 10/20/2018 Time: 10:00 am Location: [address]   Group Topic: Triggers  Goal Area(s) Addresses:  Patient will work on worksheet on Triggers. Patient will follow directions on first prompt.  Behavioral Response: Appropriate  Intervention: Worksheet  Activity:  Staff on [address] hall were provided with a worksheet on Triggers. Staff was instructed to give it to the patients and have them work on it in place of [place]. Staff was also given 2 coloring sheets and 2 word search and were given the option to give them out.  Education:  Ability to follow Directions, Change of thought processes Discharge Planning, Goal Planning.   Education Outcome: Acknowledges education/In group clarification offered  Clinical Observations/Feedback: . Due to COVID-19, guidelines group was not held. Group members were provided a learning activity worksheet to work on the topic and above-stated goals. LRT is available to answer any questions patient may have regarding the worksheet.  Samantha Ashley, Samantha Ashley         Samantha Ashley 10/20/2018 1:17 PM

## 2018-10-21 MED ORDER — RISPERIDONE 3 MG PO TABS: ORAL_TABLET | ORAL | 2 refills | Status: DC

## 2018-10-21 MED ORDER — RISPERIDONE MICROSPHERES ER 50 MG IM SRER
50.0000 mg | INTRAMUSCULAR | Status: DC
  Administered 2018-10-21: 50 mg via INTRAMUSCULAR
  Filled 2018-10-21: qty 2

## 2018-10-21 MED ORDER — DIVALPROEX SODIUM ER 500 MG PO TB24
500.0000 mg | ORAL_TABLET | Freq: Every day | ORAL | Status: DC
  Filled 2018-10-21: qty 1

## 2018-10-21 MED ORDER — BENZTROPINE MESYLATE 1 MG PO TABS: 1.0000 mg | ORAL_TABLET | Freq: Two times a day (BID) | ORAL | 2 refills | Status: DC

## 2018-10-21 MED ORDER — LORAZEPAM 1 MG PO TABS
1.5000 mg | ORAL_TABLET | Freq: Once | ORAL | Status: AC
  Administered 2018-10-21: 1.5 mg via ORAL
  Filled 2018-10-21: qty 1

## 2018-10-21 MED ORDER — RISPERIDONE 3 MG PO TABS
6.0000 mg | ORAL_TABLET | Freq: Every day | ORAL | Status: DC
  Filled 2018-10-21: qty 2

## 2018-10-21 MED ORDER — TRAZODONE HCL 100 MG PO TABS: 200.0000 mg | ORAL_TABLET | Freq: Every day | ORAL | 2 refills | Status: DC

## 2018-10-21 MED ORDER — RISPERIDONE MICROSPHERES ER 50 MG IM SRER: 50.0000 mg | INTRAMUSCULAR | 11 refills | Status: DC

## 2018-10-21 MED ORDER — RISPERIDONE 3 MG PO TABS
3.0000 mg | ORAL_TABLET | Freq: Every day | ORAL | Status: DC
  Filled 2018-10-21: qty 1

## 2018-10-21 MED ORDER — DIVALPROEX SODIUM ER 500 MG PO TB24
1000.0000 mg | ORAL_TABLET | Freq: Every day | ORAL | Status: DC
  Filled 2018-10-21: qty 2

## 2018-10-21 NOTE — Progress Notes (Signed)
Patient ID: Samantha Ashley, female   DOB: 09/20/1995, 23 y.o.   MRN: [id number]  D: Pt alert and oriented on the unit.   A: Education, support, and encouragement provided. Discharge summary, medications and follow up appointments reviewed with pt. Suicide prevention resources provided, including "My 3 App." Pt had no belongings or assigned locker.  R: Pt denies SI/HI, A/VH, pain, or any concerns at this time. Pt ambulatory on and off unit. Pt discharged to lobby.

## 2018-10-21 NOTE — Progress Notes (Signed)
  [facility] Adult Case Management Discharge Plan :  Will you be returning to the same living situation after discharge:  Yes,  home At discharge, do you have transportation home?: Yes,  mom will pick up at 1:30pm Do you have the ability to pay for your medications: Yes,  [employer]  Release of information consent forms completed and in the chart.   [facility] Follow up.   Specialty: Behavioral Health Why: The intial appointment will cost $150.00. If you wish to receive services with Patricia[facility] please call the office and speak with the billing department. Contact information: [address] [place] [address] [phone]       Patient to Follow up at: Follow-up Information    [facility] Follow up on 11/02/2018.   Why: Telephonic therapy appointment is Wednesday, 7/8 at 9:00a.  The therapist will contact you.  Contact information: [address] [place] [address] [phone]           Next level of care provider has access to [place] and Suicide Prevention discussed: Yes,  with mother  Have you used any form of tobacco in the last 30 days? (Cigarettes, Smokeless Tobacco, Cigars, and/or Pipes): Yes  Has patient been referred to the Quitline?: Patient refused referral  Patient has been referred for addiction treatment: Yes  Joellen Jersey, [place] 10/21/2018, 11:43 AM
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
## 2018-10-21 NOTE — Tx Team (Signed)
Interdisciplinary Treatment and Diagnostic Plan Update  10/21/2018 Time of Session: 09:40am Samantha Ashley MRN: [id number]  Principal Diagnosis: <principal problem not specified>  Secondary Diagnoses: Active Problems:   Bipolar depression (HCC)   Current Medications:  Current Facility-Administered Medications  Medication Dose Route Frequency Provider Last Rate Last Dose  . acetaminophen (TYLENOL) tablet 650 mg  650 mg Oral Q6H PRN Oneta RackLewis, Tanika N, NP   650 mg at 10/20/18 0754  . alum & mag hydroxide-simeth (MAALOX/MYLANTA) 200-200-20 MG/5ML suspension 30 mL  30 mL Oral Q4H PRN Oneta RackLewis, Tanika N, NP      . benztropine (COGENTIN) tablet 1 mg  1 mg Oral BID Oneta RackLewis, Tanika N, NP   1 mg at 10/21/18 0737  . divalproex (DEPAKOTE ER) 24 hr tablet 500 mg  500 mg Oral BID Oneta RackLewis, Tanika N, NP   500 mg at 10/21/18 0737  . LORazepam (ATIVAN) injection 2 mg  2 mg Intramuscular Q6H PRN Oneta RackLewis, Tanika N, NP   2 mg at 10/18/18 1613  . ziprasidone (GEODON) injection 20 mg  20 mg Intramuscular Q12H PRN Antonieta Pertlary, Greg Lawson, MD   20 mg at 10/17/18 1428   And  . LORazepam (ATIVAN) tablet 1 mg  1 mg Oral PRN Antonieta Pertlary, Greg Lawson, MD      . magnesium hydroxide (MILK OF MAGNESIA) suspension 30 mL  30 mL Oral Daily PRN Oneta RackLewis, Tanika N, NP      . omega-3 acid ethyl esters (LOVAZA) capsule 1 g  1 g Oral BID Oneta RackLewis, Tanika N, NP   1 g at 10/21/18 0737  . propranolol (INDERAL) tablet 10 mg  10 mg Oral BID Malvin JohnsFarah, Brian, MD   10 mg at 10/21/18 0736  . risperiDONE (RISPERDAL) tablet 3 mg  3 mg Oral TID Malvin JohnsFarah, Brian, MD   3 mg at 10/21/18 1332  . risperiDONE microspheres (RISPERDAL CONSTA) injection 50 mg  50 mg Intramuscular Q14 Days Malvin JohnsFarah, Brian, MD   50 mg at 10/21/18 1216  . traZODone (DESYREL) tablet 200 mg  200 mg Oral QHS Malvin JohnsFarah, Brian, MD   200 mg at 10/20/18 2149   PTA Medications: Medications Prior to Admission  Medication Sig Dispense Refill Last Dose  . divalproex (DEPAKOTE ER) 500 MG 24 hr tablet 1 in am 2 at h  s (Patient not taking: Reported on 10/16/2018) 90 tablet 2 Not Taking at Unknown time  . omega-3 acid ethyl esters (LOVAZA) 1 g capsule Take 1 capsule (1 g total) by mouth 2 (two) times daily. (Patient not taking: Reported on 10/16/2018) 60 capsule 2 Not Taking at Unknown time  . Prenatal Vit-Fe Fumarate-FA (PRENATAL MULTIVITAMIN) TABS tablet Take 1 tablet by mouth daily. (Patient not taking: Reported on 10/16/2018) 90 tablet 2 Not Taking at Unknown time  . propranolol (INDERAL) 40 MG tablet Take 1.5 tablets (60 mg total) by mouth 2 (two) times daily. (Patient not taking: Reported on 10/16/2018) 60 tablet 3 Not Taking at Unknown time  . risperiDONE (RISPERDAL) 3 MG tablet 1 in am and 2 at h s x 10 days then 2 at hs then on (Patient not taking: Reported on 10/16/2018) 70 tablet 2 Not Taking at Unknown time  . traZODone (DESYREL) 150 MG tablet Take 1 tablet (150 mg total) by mouth at bedtime as needed for sleep. (Patient not taking: Reported on 10/16/2018) 30 tablet 2 Not Taking at Unknown time  . [DISCONTINUED] benztropine (COGENTIN) 1 MG tablet Take 1 tablet (1 mg total) by mouth 2 (two) times  daily. (Patient not taking: Reported on 10/16/2018) 60 tablet 2 Not Taking at Unknown time    Patient Stressors: Medication change or noncompliance Substance abuse  Patient Strengths: Ability for insight Capable of independent living Supportive family/friends Work skills  Treatment Modalities: Medication Management, Group therapy, Case management,  1 to 1 session with clinician, Psychoeducation, Recreational therapy.   Physician Treatment Plan for Primary Diagnosis: <principal problem not specified> Long Term Goal(s): Improvement in symptoms so as ready for discharge Improvement in symptoms so as ready for discharge   Short Term Goals: Ability to identify changes in lifestyle to reduce recurrence of condition will improve Ability to verbalize feelings will improve Ability to disclose and discuss suicidal  ideas Ability to demonstrate self-control will improve Ability to identify and develop effective coping behaviors will improve Ability to maintain clinical measurements within normal limits will improve Compliance with prescribed medications will improve Ability to identify triggers associated with substance abuse/mental health issues will improve Ability to identify changes in lifestyle to reduce recurrence of condition will improve Ability to verbalize feelings will improve Ability to disclose and discuss suicidal ideas Ability to demonstrate self-control will improve Ability to identify and develop effective coping behaviors will improve Ability to maintain clinical measurements within normal limits will improve Compliance with prescribed medications will improve Ability to identify triggers associated with substance abuse/mental health issues will improve  Medication Management: Evaluate patient's response, side effects, and tolerance of medication regimen.  Therapeutic Interventions: 1 to 1 sessions, Unit Group sessions and Medication administration.  Evaluation of Outcomes: Adequate for Discharge  Physician Treatment Plan for Secondary Diagnosis: Active Problems:   Bipolar depression (HCC)  Long Term Goal(s): Improvement in symptoms so as ready for discharge Improvement in symptoms so as ready for discharge   Short Term Goals: Ability to identify changes in lifestyle to reduce recurrence of condition will improve Ability to verbalize feelings will improve Ability to disclose and discuss suicidal ideas Ability to demonstrate self-control will improve Ability to identify and develop effective coping behaviors will improve Ability to maintain clinical measurements within normal limits will improve Compliance with prescribed medications will improve Ability to identify triggers associated with substance abuse/mental health issues will improve Ability to identify changes in  lifestyle to reduce recurrence of condition will improve Ability to verbalize feelings will improve Ability to disclose and discuss suicidal ideas Ability to demonstrate self-control will improve Ability to identify and develop effective coping behaviors will improve Ability to maintain clinical measurements within normal limits will improve Compliance with prescribed medications will improve Ability to identify triggers associated with substance abuse/mental health issues will improve     Medication Management: Evaluate patient's response, side effects, and tolerance of medication regimen.  Therapeutic Interventions: 1 to 1 sessions, Unit Group sessions and Medication administration.  Evaluation of Outcomes: Adequate for Discharge   RN Treatment Plan for Primary Diagnosis: <principal problem not specified> Long Term Goal(s): Knowledge of disease and therapeutic regimen to maintain health will improve  Short Term Goals: Ability to participate in decision making will improve, Ability to verbalize feelings will improve, Ability to disclose and discuss suicidal ideas, Ability to identify and develop effective coping behaviors will improve and Compliance with prescribed medications will improve  Medication Management: RN will administer medications as ordered by provider, will assess and evaluate patient's response and provide education to patient for prescribed medication. RN will report any adverse and/or side effects to prescribing provider.  Therapeutic Interventions: 1 on 1 counseling sessions, Psychoeducation, Medication administration,  Evaluate responses to treatment, Monitor vital signs and CBGs as ordered, Perform/monitor CIWA, COWS, AIMS and Fall Risk screenings as ordered, Perform wound care treatments as ordered.  Evaluation of Outcomes: Adequate for Discharge   LCSW Treatment Plan for Primary Diagnosis: <principal problem not specified> Long Term Goal(s): Safe transition to  appropriate next level of care at discharge, Engage patient in therapeutic group addressing interpersonal concerns.  Short Term Goals: Engage patient in aftercare planning with referrals and resources and Increase skills for wellness and recovery  Therapeutic Interventions: Assess for all discharge needs, 1 to 1 time with Social worker, Explore available resources and support systems, Assess for adequacy in community support network, Educate family and significant other(s) on suicide prevention, Complete Psychosocial Assessment, Interpersonal group therapy.  Evaluation of Outcomes: Adequate for Discharge   Progress in Treatment: Attending groups: Yes. Participating in groups: Yes. Taking medication as prescribed: Yes. Toleration medication: Yes. Family/Significant other contact made: No, will contact:  will contact if given consent to contact Patient understands diagnosis: No. Discussing patient identified problems/goals with staff: Yes. Medical problems stabilized or resolved: Yes. Denies suicidal/homicidal ideation: Yes. Issues/concerns per patient self-inventory: No. Other:   New problem(s) identified: No, Describe:  None  New Short Term/Long Term Goal(s): Medication stabilization, elimination of SI thoughts, and development of a comprehensive mental wellness plan.   Patient Goals:  "To get me off of weed and go home"  Discharge Plan or Barriers: Patient plans to return home and follow up with [facility] for outpatient medication management and therapy services   Reason for Continuation of Hospitalization:   Estimated Length of Stay: 10/21/2018 Attendees: Patient: 10/21/2018   Physician: Dr. Johnn Hai, MD 10/21/2018   Nursing: Yetta Flock, RN; Elberta Fortis.A, RN 10/21/2018   RN Care Manager: 10/21/2018   Social Worker: Ardelle Anton, LCSW; [place], [place] 10/21/2018   Recreational Therapist:  10/21/2018  Other:  10/21/2018   Other:  10/21/2018   Other: 10/21/2018     Scribe for  Treatment Team: Marylee Floras, LCSWA 10/21/2018 1:40 PM

## 2018-10-21 NOTE — [facility] Suicide Risk Assessment (Signed)
[facility] Discharge Suicide Risk Assessment   Principal Problem: Exacerbation of underlying schizoaffective versus bipolar type condition Discharge Diagnoses: Active Problems:   Bipolar depression ([place])   Total Time spent with patient: 45 minutes  Musculoskeletal: Strength & Muscle Tone: within normal limits Gait & Station: normal Patient leans: N/A  Psychiatric Specialty Exam: ROS  Blood pressure 117/83, pulse 100, temperature 98.2 F (36.8 C), temperature source Oral, resp. rate 16, height 5\' 4"  (1.626 m), weight 109 kg, SpO2 100 %.Body mass index is 41.25 kg/m.  General Appearance: Casual  Eye Contact::  Good  Speech:  nl  Volume:  Normal  Mood:  Euthymic  Affect:  Full Range  Thought Process:  Coherent  Orientation:  Full (Time, Place, and Person)  Thought Content:  Logical  Suicidal Thoughts:  No  Homicidal Thoughts:  No  Memory:  Immediate;   Fair  Judgement:  Intact  Insight:  Good  Psychomotor Activity:  Normal  Concentration:  Good  Recall:  Good  Fund of Knowledge:Good  Language: Good  Akathisia:  Negative  Handed:  Right  AIMS (if indicated):     Assets:  Communication Skills Physical Health Resilience  Sleep:  Number of Hours: 6.25  Cognition: WNL  ADL's:  Intact   Mental Status Per Nursing Assessment::   On Admission:  NA  Demographic Factors:  Gay, lesbian, or bisexual orientation  Loss Factors: Loss of significant relationship  Historical Factors: NA  Risk Reduction Factors:   Sense of responsibility to family and Religious beliefs about death  Continued Clinical Symptoms:  Bipolar Disorder:   Mixed State  Cognitive Features That Contribute To Risk:  None    Suicide Risk:  Minimal: No identifiable suicidal ideation.  Patients presenting with no risk factors but with morbid ruminations; may be classified as minimal risk based on the severity of the depressive symptoms  Follow-up Information    [facility] Follow up on 11/02/2018.   Why:  Telephonic therapy appointment is Wednesday, 7/8 at 9:00a.  The therapist will contact you.  Contact information: [address] [place] [address] [phone]         Informed regarding death of her uncle Ronalee Belts she took it fairly well she is tearful but did not decompensate as far as a psychotic reaction stable for release  Plan Of Care/Follow-up recommendations:  Activity:  full  Imari Reen, MD 10/21/2018, 10:05 AM

## 2018-10-21 NOTE — Progress Notes (Signed)
Spirituality group facilitated by Simone Curia, MDIv, [place].  Group Description:  Group focused on topic of hope.  Patients participated in facilitated discussion around topic, connecting with one another around experiences and definitions for hope.  Group members engaged with visual explorer photos, reflecting on what hope looks like for them today.  Group engaged in discussion around how their definitions of hope are present today in hospital.   Modalities: Psycho-social ed, Adlerian, Narrative, MI Patient Progress: Present for beginning of group.  Actively engaged in group discussion.  Left group room to prepare for discharge.

## 2018-10-21 NOTE — Progress Notes (Signed)
Patient ID: Samantha Ashley, female   DOB: 03/21/1996, 22 y.o.   MRN: [id number]   [place] NOVEL CORONAVIRUS (COVID-19) DAILY CHECK-OFF SYMPTOMS - answer yes or no to each - every day NO YES  Have you had a fever in the past 24 hours?  . Fever (Temp > 37.80C / 100F) X   Have you had any of these symptoms in the past 24 hours? . New Cough .  Sore Throat  .  Shortness of Breath .  Difficulty Breathing .  Unexplained Body Aches   X   Have you had any one of these symptoms in the past 24 hours not related to allergies?   . Runny Nose .  Nasal Congestion .  Sneezing   X   If you have had runny nose, nasal congestion, sneezing in the past 24 hours, has it worsened?  X   EXPOSURES - check yes or no X   Have you traveled outside the state in the past 14 days?  X   Have you been in contact with someone with a confirmed diagnosis of COVID-19 or PUI in the past 14 days without wearing appropriate PPE?  X   Have you been living in the same home as a person with confirmed diagnosis of COVID-19 or a PUI (household contact)?    X   Have you been diagnosed with COVID-19?    X              What to do next: Answered NO to all: Answered YES to anything:   Proceed with unit schedule Follow the [facility] Inpatient Flowsheet.   

## 2018-10-21 NOTE — [facility] Suicide Risk Assessment (Signed)
[place] INPATIENT:  Family/Significant Other Suicide Prevention Education  Suicide Prevention Education:  Education Completed; mother, Amna Welker [phone] has been identified by the patient as the family member/significant other with whom the patient will be residing, and identified as the person(s) who will aid the patient in the event of a mental health crisis (suicidal ideations/suicide attempt).  With written consent from the patient, the family member/significant other has been provided the following suicide prevention education, prior to the and/or following the discharge of the patient.  The suicide prevention education provided includes the following:  Suicide risk factors  Suicide prevention and interventions  National Suicide Hotline telephone number  [facility] assessment telephone number  [facility] Emergency Assistance [place] and/or Residential Mobile Crisis Unit telephone number  Request made of family/significant other to:  Remove weapons (e.g., guns, rifles, knives), all items previously/currently identified as safety concern.    Remove drugs/medications (over-the-counter, prescriptions, illicit drugs), all items previously/currently identified as a safety concern.  The family member/significant other verbalizes understanding of the suicide prevention education information provided.  The family member/significant other agrees to remove the items of safety concern listed above.  Mother had questions and concerns pertaining to medications, CSW explained that patient will have the opportunity to review discharge medications with her nurse prior to discharge.  Mother is agreeable to discharge, SPE was reviewed with mother in detail last week, when patient was discharged. She has no additional questions for CSW at this time.  Joellen Jersey 10/21/2018, 11:40 AM

## 2018-10-21 NOTE — Progress Notes (Signed)
D: Pt denies SI/HI/AV hallucinations. Pt is pleasant and cooperative. Pt has been observed in milieu.  A: Pt was offered support and encouragement. Pt was given scheduled medications. Pt was encourage to attend groups. Q 15 minute checks were done for safety.  R:Pt  interacts well with peers and staff. Pt is taking medication. Pt has no complaints.Pt receptive to treatment and safety maintained on unit.

## 2018-10-21 NOTE — Progress Notes (Signed)
Recreation Therapy Notes  Date: 10/21/2018 Time: 10:00 am Location: [address] hall   Group Topic: Stress Exploration  Goal Area(s) Addresses:  Patient will work on worksheet on Stress Exploration. Patient will follow directions on first prompt.  Behavioral Response: Appropriate  Intervention: Worksheet  Activity:  Staff on [address] hall were provided with a worksheet on Stress Exploration. Staff was instructed to give it to the patients and have them work on it in place of Recreation Therapy Group. Staff was also given 4 coloring sheets and were given the option to give them out.  Education:  Ability to follow Directions, Change of thought processes Discharge Planning, Goal Planning.   Education Outcome: Acknowledges education/In group clarification offered  Clinical Observations/Feedback: . Due to COVID-19, guidelines group was not held. Group members were provided a learning activity worksheet to work on the topic and above-stated goals. LRT is available to answer any questions patient may have regarding the worksheet.  Ruthel Martine L Adylee Leonardo, LRT/CTRS         Rita Prom L Bralee Feldt 10/21/2018 12:38 PM 

## 2018-10-21 NOTE — Plan of Care (Signed)
  Problem: Education: Goal: Knowledge of the prescribed therapeutic regimen will improve Outcome: Progressing   Problem: Coping: Goal: Will verbalize feelings Outcome: Progressing   Problem: Role Relationship: Goal: Ability to interact with others will improve Outcome: Progressing   Problem: Safety: Goal: Ability to remain free from injury will improve Outcome: Progressing   Problem: Education: Goal: Verbalization of understanding the information provided will improve Outcome: Progressing   Problem: Activity: Goal: Sleeping patterns will improve Outcome: Progressing   Problem: Safety: Goal: Periods of time without injury will increase Outcome: Progressing

## 2018-10-25 ENCOUNTER — Other Ambulatory Visit: Payer: Self-pay

## 2018-10-25 ENCOUNTER — Emergency Department (HOSPITAL_COMMUNITY)
Admission: EM | Admit: 2018-10-25 | Discharge: 2018-10-28 | Disposition: A | Discharge status: Other Acute Inpatient Hospital | Payer: 59 | Source: Home / Self Care | Attending: Emergency Medicine | Admitting: Emergency Medicine

## 2018-10-25 ENCOUNTER — Encounter (HOSPITAL_COMMUNITY): Payer: Self-pay | Admitting: Emergency Medicine

## 2018-10-25 DIAGNOSIS — R462 Strange and inexplicable behavior: Secondary | ICD-10-CM

## 2018-10-25 DIAGNOSIS — F1290 Cannabis use, unspecified, uncomplicated: Secondary | ICD-10-CM | POA: Insufficient documentation

## 2018-10-25 DIAGNOSIS — Z20828 Contact with and (suspected) exposure to other viral communicable diseases: Secondary | ICD-10-CM | POA: Insufficient documentation

## 2018-10-25 DIAGNOSIS — Z79899 Other long term (current) drug therapy: Secondary | ICD-10-CM | POA: Insufficient documentation

## 2018-10-25 DIAGNOSIS — F29 Unspecified psychosis not due to a substance or known physiological condition: Secondary | ICD-10-CM | POA: Insufficient documentation

## 2018-10-25 DIAGNOSIS — R4789 Other speech disturbances: Secondary | ICD-10-CM

## 2018-10-25 DIAGNOSIS — J45909 Unspecified asthma, uncomplicated: Secondary | ICD-10-CM | POA: Insufficient documentation

## 2018-10-25 DIAGNOSIS — Z046 Encounter for general psychiatric examination, requested by authority: Secondary | ICD-10-CM

## 2018-10-25 DIAGNOSIS — F250 Schizoaffective disorder, bipolar type: Secondary | ICD-10-CM | POA: Diagnosis not present

## 2018-10-25 DIAGNOSIS — F17210 Nicotine dependence, cigarettes, uncomplicated: Secondary | ICD-10-CM | POA: Insufficient documentation

## 2018-10-25 DIAGNOSIS — R451 Restlessness and agitation: Secondary | ICD-10-CM | POA: Insufficient documentation

## 2018-10-25 LAB — COMPREHENSIVE METABOLIC PANEL
ALT: 21 U/L (ref 0–44)
AST: 24 U/L (ref 15–41)
Albumin: 3.8 g/dL (ref 3.5–5.0)
Alkaline Phosphatase: 49 U/L (ref 38–126)
Anion gap: 11 (ref 5–15)
BUN: <5 mg/dL — ABNORMAL LOW (ref 6–20)
CO2: 20 mmol/L — ABNORMAL LOW (ref 22–32)
Calcium: 9.1 mg/dL (ref 8.9–10.3)
Chloride: 107 mmol/L (ref 98–111)
Creatinine, Ser: 1.06 mg/dL — ABNORMAL HIGH (ref 0.44–1.00)
GFR calc Af Amer: >60 mL/min (ref >60)
GFR calc non Af Amer: >60 mL/min (ref >60)
Glucose, Bld: 135 mg/dL — ABNORMAL HIGH (ref 70–99)
Potassium: 3.3 mmol/L — ABNORMAL LOW (ref 3.5–5.1)
Sodium: 138 mmol/L (ref 135–145)
Total Bilirubin: 0.8 mg/dL (ref 0.3–1.2)
Total Protein: 7.4 g/dL (ref 6.5–8.1)

## 2018-10-25 LAB — CBC WITH DIFFERENTIAL/PLATELET
Abs Immature Granulocytes: 0.02 10*3/uL (ref 0.00–0.07)
Basophils Absolute: 0 10*3/uL (ref 0.0–0.1)
Basophils Relative: 1 %
Eosinophils Absolute: 0.1 10*3/uL (ref 0.0–0.5)
Eosinophils Relative: 1 %
HCT: 37.6 % (ref 36.0–46.0)
Hemoglobin: 12.4 g/dL (ref 12.0–15.0)
Immature Granulocytes: 0 %
Lymphocytes Relative: 28 %
Lymphs Abs: 1.8 10*3/uL (ref 0.7–4.0)
MCH: 29.3 pg (ref 26.0–34.0)
MCHC: 33 g/dL (ref 30.0–36.0)
MCV: 88.9 fL (ref 80.0–100.0)
Monocytes Absolute: 0.4 10*3/uL (ref 0.1–1.0)
Monocytes Relative: 7 %
Neutro Abs: 4 10*3/uL (ref 1.7–7.7)
Neutrophils Relative %: 63 %
Platelets: 241 10*3/uL (ref 150–400)
RBC: 4.23 MIL/uL (ref 3.87–5.11)
RDW: 12.9 % (ref 11.5–15.5)
WBC: 6.2 10*3/uL (ref 4.0–10.5)
nRBC: 0 % (ref 0.0–0.2)

## 2018-10-25 LAB — SALICYLATE LEVEL: Salicylate Lvl: <7 mg/dL (ref 2.8–30.0)

## 2018-10-25 LAB — SARS CORONAVIRUS 2 BY RT PCR (HOSPITAL ORDER, PERFORMED IN ~~LOC~~ HOSPITAL LAB): SARS Coronavirus 2: NEGATIVE

## 2018-10-25 LAB — ACETAMINOPHEN LEVEL: Acetaminophen (Tylenol), Serum: <10 ug/mL — ABNORMAL LOW (ref 10–30)

## 2018-10-25 LAB — RAPID URINE DRUG SCREEN, HOSP PERFORMED
Amphetamines: NOT DETECTED
Barbiturates: NOT DETECTED
Benzodiazepines: NOT DETECTED
Cocaine: NOT DETECTED
Opiates: NOT DETECTED
Tetrahydrocannabinol: POSITIVE — AB

## 2018-10-25 LAB — I-STAT BETA HCG BLOOD, ED (MC, WL, AP ONLY): I-stat hCG, quantitative: <5 m[IU]/mL (ref <5)

## 2018-10-25 LAB — ETHANOL: Alcohol, Ethyl (B): <10 mg/dL (ref <10)

## 2018-10-25 MED ORDER — RISPERIDONE MICROSPHERES ER 50 MG IM SRER: 50.0000 mg | INTRAMUSCULAR | Status: DC

## 2018-10-25 MED ORDER — RISPERIDONE 3 MG PO TABS
3.0000 mg | ORAL_TABLET | Freq: Every day | ORAL | Status: DC
  Administered 2018-10-25 – 2018-10-28 (×4): 3 mg via ORAL
  Filled 2018-10-25 (×4): qty 1

## 2018-10-25 MED ORDER — ALUM & MAG HYDROXIDE-SIMETH 200-200-20 MG/5ML PO SUSP: 30.0000 mL | Freq: Four times a day (QID) | ORAL | Status: DC | PRN

## 2018-10-25 MED ORDER — ONDANSETRON HCL 4 MG PO TABS: 4.0000 mg | ORAL_TABLET | Freq: Three times a day (TID) | ORAL | Status: DC | PRN

## 2018-10-25 MED ORDER — LORAZEPAM 2 MG/ML IJ SOLN
2.0000 mg | Freq: Once | INTRAMUSCULAR | Status: AC
  Administered 2018-10-25: 2 mg via INTRAMUSCULAR
  Filled 2018-10-25: qty 1

## 2018-10-25 MED ORDER — ZOLPIDEM TARTRATE 5 MG PO TABS
5.0000 mg | ORAL_TABLET | Freq: Every evening | ORAL | Status: DC | PRN
  Administered 2018-10-27 – 2018-10-28 (×2): 5 mg via ORAL
  Filled 2018-10-25 (×2): qty 1

## 2018-10-25 MED ORDER — LORAZEPAM 1 MG PO TABS: 1.0000 mg | ORAL_TABLET | Freq: Once | ORAL | Status: DC

## 2018-10-25 MED ORDER — STERILE WATER FOR INJECTION IJ SOLN
INTRAMUSCULAR | Status: AC
  Administered 2018-10-25: 16:00:00 10 mL
  Filled 2018-10-25: qty 10

## 2018-10-25 MED ORDER — ZIPRASIDONE MESYLATE 20 MG IM SOLR
20.0000 mg | Freq: Once | INTRAMUSCULAR | Status: AC
  Administered 2018-10-25: 12:00:00 20 mg via INTRAMUSCULAR
  Filled 2018-10-25: qty 20

## 2018-10-25 MED ORDER — ACETAMINOPHEN 325 MG PO TABS: 650.0000 mg | ORAL_TABLET | ORAL | Status: DC | PRN

## 2018-10-25 MED ORDER — TRAZODONE HCL 100 MG PO TABS
200.0000 mg | ORAL_TABLET | Freq: Every day | ORAL | Status: DC
  Administered 2018-10-25 – 2018-10-27 (×3): 200 mg via ORAL
  Filled 2018-10-25 (×4): qty 2

## 2018-10-25 MED ORDER — BENZTROPINE MESYLATE 1 MG PO TABS
1.0000 mg | ORAL_TABLET | Freq: Two times a day (BID) | ORAL | Status: DC
  Administered 2018-10-25 – 2018-10-28 (×6): 1 mg via ORAL
  Filled 2018-10-25 (×6): qty 1

## 2018-10-25 MED ORDER — OLANZAPINE 10 MG IM SOLR
5.0000 mg | Freq: Once | INTRAMUSCULAR | Status: AC
  Administered 2018-10-25: 16:00:00 5 mg via INTRAMUSCULAR
  Filled 2018-10-25: qty 10

## 2018-10-25 NOTE — ED Triage Notes (Signed)
Pt arrives via [employer] with reports of mother stating pt was trying to set of sparklers in her apt. 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 Pt here 2 weeks ago for same behavior.

## 2018-10-25 NOTE — ED Notes (Signed)
Dinner tray ordered.

## 2018-10-25 NOTE — ED Notes (Signed)
RN faxed IVC paperwork to [facility]

## 2018-10-25 NOTE — ED Notes (Signed)
Pt ambulated to restroom with steady gait. Pt calm and coroperative at this time. Sitting up and eating food. Sitter at bedside.

## 2018-10-25 NOTE — ED Notes (Signed)
Pt resting comfortably in bed at this time

## 2018-10-25 NOTE — ED Notes (Signed)
Pt gave this RN permission to speak to mother about care plan. Mother very concerned and asked to be updated as developments occur.
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
## 2018-10-25 NOTE — ED Notes (Signed)
Called staffing and they said they do not have sitters but they will put her on the list.

## 2018-10-25 NOTE — ED Notes (Signed)
Pt making phone call. Pt made aware of phone privilege. Pt verbalized understanding.

## 2018-10-25 NOTE — ED Provider Notes (Signed)
[facility] EMERGENCY [facility] Provider Note   CSN: [id number] Arrival date & time: 10/25/18  1124    History   Chief Complaint Chief Complaint  Patient presents with  . IVC    Level 5 caveat due to psychiatric disorder  HPI Samantha [facility] is a 23 y.o. female with history of asthma, bipolar depression, schizophrenia [facility].  Per triage note, patient's mother took out IVC paperwork on her because she was trying to light sparklers in her apartment.  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  She was here 2 weeks ago with similar behavior.  The patient exhibits disorganized speech and I am unable to obtain a history from her.     The history is provided by the police and medical records.    Past Medical History:  Diagnosis Date  . Asthma     Patient Active Problem List   Diagnosis Date Noted  . Bipolar depression (HCC) 10/15/2018  . Schizoaffective disorder, bipolar type (HCC) 10/10/2018  . Schizophrenia (HCC) 10/10/2018    History reviewed. No pertinent surgical history.   OB History   No obstetric history on file.      Home Medications    Prior to Admission medications   Medication Sig Start Date End Date Taking? Authorizing Provider  benztropine (COGENTIN) 1 MG tablet Take 1 tablet (1 mg total) by mouth 2 (two) times daily. 10/21/18  Yes Malvin JohnsFarah, Brian, MD  divalproex (DEPAKOTE ER) 500 MG 24 hr tablet 1 in am 2 at h s Patient taking differently: Take 500 mg by mouth 2 (two) times a day. 1 in the morning and 2 at bedtime 10/13/18  Yes Malvin JohnsFarah, Brian, MD  omega-3 acid ethyl esters (LOVAZA) 1 g capsule Take 1 capsule (1 g total) by mouth 2 (two) times daily. 10/13/18  Yes Malvin JohnsFarah, Brian, MD  propranolol (INDERAL) 40 MG tablet Take 1.5 tablets (60 mg total) by mouth 2 (two) times daily. 10/13/18  Yes Malvin JohnsFarah, Brian, MD  risperiDONE (RISPERDAL) 3 MG tablet 1 in am 2 at hs 10/21/18  Yes  Malvin JohnsFarah, Brian, MD  risperiDONE microspheres (RISPERDAL CONSTA) 50 MG injection Inject 2 mLs (50 mg total) into the muscle every 14 (fourteen) days. Due 7/16 10/21/18  Yes Malvin JohnsFarah, Brian, MD  traZODone (DESYREL) 100 MG tablet Take 2 tablets (200 mg total) by mouth at bedtime. 10/21/18  Yes Malvin JohnsFarah, Brian, MD  Prenatal Vit-Fe Fumarate-FA (PRENATAL MULTIVITAMIN) TABS tablet Take 1 tablet by mouth daily. Patient not taking: Reported on 10/16/2018 10/14/18   Malvin JohnsFarah, Brian, MD    Family History No family history on file.  Social History Social History   Tobacco Use  . Smoking status: Current Every Day Smoker    Types: Cigars  . Smokeless tobacco: Never Used  Substance Use Topics  . Alcohol use: No  . Drug use: Yes    Types: Marijuana     Allergies   Coconut oil   Review of Systems Review of Systems  Unable to perform ROS: Psychiatric disorder     Physical Exam Updated Vital Signs BP (!) 110/55 (BP Location: Right Arm)   Pulse 60   Temp 98.3 F (36.8 C)   Resp 15   Ht 5\' 4"  (1.626 m)   Wt 108.9 kg   SpO2 100%   BMI 41.20 kg/m   Physical Exam Vitals signs and nursing note reviewed.  Constitutional:      General: She  is not in acute distress.    Appearance: She is well-developed.  HENT:     Head: Normocephalic and atraumatic.  Eyes:     General:        Right eye: No discharge.        Left eye: No discharge.     Conjunctiva/sclera: Conjunctivae normal.  Neck:     Vascular: No JVD.     Trachea: No tracheal deviation.  Cardiovascular:     Rate and Rhythm: Tachycardia present.  Pulmonary:     Effort: Pulmonary effort is normal.  Abdominal:     General: There is no distension.  Skin:    General: Skin is warm and dry.     Findings: No erythema.  Neurological:     Mental Status: She is alert.  Psychiatric:        Attention and Perception: She is inattentive.        Mood and Affect: Affect is labile.        Speech: Speech is rapid and pressured.        Behavior:  Behavior is agitated. Behavior is cooperative.        Thought Content: Thought content is paranoid.        Judgment: Judgment is impulsive and inappropriate.      ED Treatments / Results  Labs (all labs ordered are listed, but only abnormal results are displayed) Labs Reviewed  COMPREHENSIVE METABOLIC PANEL - Abnormal; Notable for the following components:      Result Value   Potassium 3.3 (*)    CO2 20 (*)    Glucose, Bld 135 (*)    BUN <5 (*)    Creatinine, Ser 1.06 (*)    All other components within normal limits  RAPID URINE DRUG SCREEN, HOSP PERFORMED - Abnormal; Notable for the following components:   Tetrahydrocannabinol POSITIVE (*)    All other components within normal limits  ACETAMINOPHEN LEVEL - Abnormal; Notable for the following components:   Acetaminophen (Tylenol), Serum <10 (*)    All other components within normal limits  SARS CORONAVIRUS 2 ([facility] ORDER, [facility])  ETHANOL  CBC WITH DIFFERENTIAL/PLATELET  SALICYLATE LEVEL  I-STAT BETA HCG BLOOD, ED ([facility], [facility], [facility] ONLY)    EKG   Radiology No results found.  Procedures Procedures (including critical care time)  Medications Ordered in ED Medications  acetaminophen (TYLENOL) tablet 650 mg (has no administration in time range)  zolpidem (AMBIEN) tablet 5 mg (has no administration in time range)  ondansetron (ZOFRAN) tablet 4 mg (has no administration in time range)  alum & mag hydroxide-simeth (MAALOX/MYLANTA) 200-200-20 MG/5ML suspension 30 mL (has no administration in time range)  benztropine (COGENTIN) tablet 1 mg (has no administration in time range)  risperiDONE (RISPERDAL) tablet 3 mg (3 mg Oral Given 10/25/18 1624)  traZODone (DESYREL) tablet 200 mg (has no administration in time range)  LORazepam (ATIVAN) tablet 1 mg (has no administration in time range)  ziprasidone (GEODON) injection 20 mg (20 mg Intramuscular Given 10/25/18 1134)  OLANZapine (ZYPREXA) injection 5  mg (5 mg Intramuscular Given 10/25/18 1624)  sterile water (preservative free) injection (10 mLs  Given 10/25/18 1624)     Initial Impression / Assessment and Plan / ED Course  I have reviewed the triage vital signs and the nursing notes.  Pertinent labs & imaging results that were available during my care of the patient were reviewed by me and considered in my medical decision making (see chart  for details).  [facility] evaluation of erratic behavior.  She is afebrile, initially tachycardic with improvement on reevaluation.  She presents appearing agitated able to follow commands but speech is disorganized and nonsensical.  She required IM Geodon due to agitation.  Screening labs reviewed by me shows no leukocytosis, no anemia, no metabolic derangements.  UDS is positive for THC.  She is resting comfortably on reassessment.  She is medically cleared for TTS evaluation at this time.  Final Clinical Impressions(s) / ED Diagnoses   Final diagnoses:  Involuntary commitment  Bizarre behavior  Rapid rate of speech    ED Discharge Orders    None       Bennye AlmFawze, Vertis Scheib A, PA-C 10/25/18 1751    Donnetta Hutchingook, Brian, MD 11/01/18 [phone]

## 2018-10-25 NOTE — ED Notes (Signed)
Pt sleeping during 3p snack pass

## 2018-10-25 NOTE — ED Notes (Signed)
Pt very loud, speaking is fast and not making sense. Pt pulling down clothes and exposing herself to other female pts.

## 2018-10-25 NOTE — ED Notes (Signed)
TTS at bedside. 
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
## 2018-10-25 NOTE — ED Notes (Signed)
Patient has been wanded by security.  

## 2018-10-26 ENCOUNTER — Other Ambulatory Visit: Payer: Self-pay

## 2018-10-26 MED ORDER — HALOPERIDOL 5 MG PO TABS
5.0000 mg | ORAL_TABLET | Freq: Four times a day (QID) | ORAL | Status: DC | PRN
  Administered 2018-10-27 (×3): 5 mg via ORAL
  Filled 2018-10-26 (×2): qty 1

## 2018-10-26 MED ORDER — LORAZEPAM 1 MG PO TABS
1.0000 mg | ORAL_TABLET | Freq: Four times a day (QID) | ORAL | Status: DC | PRN
  Administered 2018-10-26 – 2018-10-27 (×5): 1 mg via ORAL
  Filled 2018-10-26 (×5): qty 1

## 2018-10-26 MED ORDER — STERILE WATER FOR INJECTION IJ SOLN
INTRAMUSCULAR | Status: AC
  Filled 2018-10-26: qty 10

## 2018-10-26 MED ORDER — LORAZEPAM 2 MG/ML IJ SOLN
1.0000 mg | Freq: Once | INTRAMUSCULAR | Status: AC
  Administered 2018-10-26: 1 mg via INTRAMUSCULAR
  Filled 2018-10-26: qty 1

## 2018-10-26 MED ORDER — HALOPERIDOL LACTATE 5 MG/ML IJ SOLN
5.0000 mg | Freq: Four times a day (QID) | INTRAMUSCULAR | Status: DC | PRN
  Administered 2018-10-26: 5 mg via INTRAMUSCULAR
  Filled 2018-10-26: qty 1

## 2018-10-26 MED ORDER — HALOPERIDOL 5 MG PO TABS
5.0000 mg | ORAL_TABLET | Freq: Once | ORAL | Status: DC
  Filled 2018-10-26: qty 1

## 2018-10-26 MED ORDER — ZIPRASIDONE MESYLATE 20 MG IM SOLR
20.0000 mg | Freq: Once | INTRAMUSCULAR | Status: AC
  Administered 2018-10-26: 20 mg via INTRAMUSCULAR
  Filled 2018-10-26: qty 20

## 2018-10-26 MED ORDER — LORAZEPAM 2 MG/ML IJ SOLN: 1.0000 mg | Freq: Four times a day (QID) | INTRAMUSCULAR | Status: DC | PRN

## 2018-10-26 NOTE — ED Notes (Signed)
Patient talk to mother this morning, and made her 2nd phone call, but left message on a answer machine.

## 2018-10-26 NOTE — ED Provider Notes (Signed)
Patient has become very agitated and unable to keep her controlled.  She had previously been given ziprasidone and lorazepam for sedation with last dose of ziprasidone at 11:30 AM.  She required sedation with ziprasidone for safety of the patient and for staff.  Following this, she was noted to be resting quietly.  CRITICAL CARE Performed by: Delora Fuel Total critical care time: 35 minutes Critical care time was exclusive of separately billable procedures and treating other patients. Critical care was necessary to treat or prevent imminent or life-threatening deterioration. Critical care was time spent personally by me on the following activities: development of treatment plan with patient and/or surrogate as well as nursing, discussions with consultants, evaluation of patient's response to treatment, examination of patient, obtaining history from patient or surrogate, ordering and performing treatments and interventions, ordering and review of laboratory studies, ordering and review of radiographic studies, pulse oximetry and re-evaluation of patient's condition.    Delora Fuel, MD 91/63/84 [phone]

## 2018-10-26 NOTE — ED Notes (Signed)
Pt attempting to leave room, being escorted back to room by security

## 2018-10-26 NOTE — ED Notes (Signed)
Patient is having disorganized thoughts, flight of ideas, and loose association thought process currently. Patient is very agitated yelling and cursing at staff. PA Lattie Haw to order Haldol/Ativan IM for patient. Security at bedside.

## 2018-10-26 NOTE — ED Notes (Signed)
Pt standing at the door speaking about other pt to [facility] officer. Pt then walking to the bathroom sat herself on the ground and got up immediately.

## 2018-10-26 NOTE — ED Notes (Signed)
Pt woke up, yelling, refusing to stay in the room.  She was offered oral medication however she declined.  Pt began threatening staff, security bedside.  Provider ordered Geodon which was given w/ the standby assistance of security and [employer].  Pt continues to yell and threaten staff.

## 2018-10-26 NOTE — ED Notes (Signed)
RN spoke to [facility], confirmed that assessment had been completed and placement was being sought.

## 2018-10-26 NOTE — Progress Notes (Signed)
Pt. meets criteria for inpatient treatment per  Lindon Romp, NP.  No appropriate beds available at [facility]. Referred out to the following hospitals: [facility]    [facility]      [facility]       [facility]   Disposition CSW will continue to follow for placement.  Areatha Keas. Judi Cong, MSW, [place] Disposition Clinical Social Work [phone] (cell) [phone] (office)

## 2018-10-26 NOTE — ED Notes (Signed)
Pt sleeping after pressured speech and flight of ideas since RN arrived at 1900. RN will hold off on waking pt up to take meds.

## 2018-10-26 NOTE — ED Notes (Signed)
Patient given phone call to call her father. Patient has flight of ideas.

## 2018-10-26 NOTE — ED Notes (Signed)
Pt's speech continues to be pressured, tangenital, with flight of ideas. Speaking with sitter with appropriate volume at this time.

## 2018-10-26 NOTE — ED Notes (Addendum)
Pt wanting to call mother again, phone policy reiterated to pt. Pt angry, angrily praying aloud and quoting Scripture. Pt offered Bible, said "give it to me!" when informed it was full Bible

## 2018-10-26 NOTE — ED Notes (Signed)
Diet was ordered for Lunch. 

## 2018-10-26 NOTE — ED Provider Notes (Signed)
Pt seen by me at request of RN.  Pt anxious and agitated.  Pt states she needs her phone and to get out of here.  Pt states she needs real therapy and real medication  PE  Vitals stable.  Pt rocking,  Pt talking about pain score, pointing out numbers.  Pt states her anxiety if a 10.   PRn orders given for geodon and ativan.    Fransico Meadow, PA-C 10/26/18 [place], [place], MD 10/27/18 [phone]

## 2018-10-26 NOTE — ED Notes (Signed)
Pt talking to mother on phone, flight of ideas and incoherent

## 2018-10-26 NOTE — ED Notes (Signed)
Patient refused a snack, but was given 2 cups of  Water.

## 2018-10-26 NOTE — ED Notes (Signed)
Patient given lunch tray.
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
## 2018-10-26 NOTE — ED Notes (Addendum)
Pt continues to yell from room having woken up the entire unit.  She is pacing the room but at this point staying in the room whiling yelling threats and vulgarities at staff.  RN attempting to contact [facility].

## 2018-10-27 ENCOUNTER — Other Ambulatory Visit: Payer: Self-pay

## 2018-10-27 NOTE — ED Notes (Signed)
Pt out to desk stating that she needs to go home because she has a paper due by midnight and that she still hasn't seen a doctor here. Told pt that I am reviewing her chart and would come see her in a few minutes.

## 2018-10-27 NOTE — ED Notes (Signed)
Pt now awake - pt ambulating to desk attempting to talk w/nurse. Pt returns to room as asked. Encouraging pt to talk w/Sitter. Advised pt staff will talk w/her in her room. Encouraging pt to remain in room.

## 2018-10-27 NOTE — ED Notes (Signed)
Patient was given a cup of water. 

## 2018-10-27 NOTE — ED Notes (Signed)
Pt standing at door speaking to [facility] officer in pleasant tone. Talking about how she is mostly mad at her mom for bringing her here. Pt began raising voice. RN provided ativan PO before escalation.

## 2018-10-27 NOTE — ED Provider Notes (Signed)
Patient evaluated on morning rounds.  She seems very agitated and wants to leave.  She is easily redirected.  She is perseverating on the fact that she can cancel her appointment at [facility] and that she is a grown woman.  She reports she can leave if she wants.  I made her aware that she cannot due to her IVC placement.  She then stated she did not want to talk to me.  Her speech is rapid and repetitive.  She seems to be delusional at times.  Nursing reports this has been her baseline and she is redirected and does have PRN medications for agitation.  CSW seeks inpatient placement.   Frederica Kuster, PA-C 10/27/18 1145    Lacretia Leigh, MD 10/30/18 [phone]

## 2018-10-27 NOTE — ED Notes (Signed)
Pt refusing to talk w/PA.

## 2018-10-27 NOTE — ED Notes (Signed)
Received a call from [facility]. Bed available. Pt has just been medicated and they would rather her come in the am if possible. Will continue to monitor. Pt aware of transfer and agreeable to this. States that she will be cooperative.

## 2018-10-27 NOTE — BH Assessment (Signed)
Patient accepted for involuntary admission to [facility], MD Jake Samples, bed 506-1. Please call report to [phone].

## 2018-10-27 NOTE — ED Notes (Addendum)
Pt has taken paper scrubs top and wrapped her hair in it. Pt on phone at nurses' desk leaving message for her father. Pt then called her mother. Voiced understanding of 2-phone call limit. - States "today is October 27, 2018 and this is your daughter" then states her full name and states "the one you named". Pt noted to be delusional.

## 2018-10-27 NOTE — ED Notes (Signed)
Becoming more irritable- requesting to go home, requesting to talk with mother == and have nurse talk with mother- given meds and crayons and word search papers.

## 2018-10-27 NOTE — ED Notes (Signed)
Pt noted to be ambulating back and forth from room to nurses' desk - asking for multiple items.

## 2018-10-27 NOTE — ED Notes (Signed)
Pt noted to be manic and restless this am. Continuously ambulating to nurses' desk w/multiple requests. Pt showered w/o difficulty.

## 2018-10-27 NOTE — ED Notes (Signed)
Dinner tray ordered.

## 2018-10-27 NOTE — ED Notes (Signed)
bfast ordered 

## 2018-10-27 NOTE — ED Notes (Signed)
Pt removed shirt from hair. Pt took meds w/o difficulty. Pt talking w/Sitter - delusional, rapid, repetitive, tangential speech noted.

## 2018-10-27 NOTE — ED Notes (Signed)
Pt noted w/tangential speech - asking for Ativan - states "I feel like I need to rest some more". Pt took med w/o difficulty.

## 2018-10-27 NOTE — ED Notes (Signed)
Father, Kiyomi Pallo, called [phone] - inquiring about pt. Advised continuing to wait for placement.

## 2018-10-28 ENCOUNTER — Other Ambulatory Visit: Payer: Self-pay

## 2018-10-28 ENCOUNTER — Inpatient Hospital Stay (HOSPITAL_COMMUNITY)
Admission: AD | Admit: 2018-10-28 | Discharge: 2018-11-03 | DRG: 885 | Disposition: A | Discharge status: Home / Self Care | Payer: 59 | Source: Intra-hospital | Attending: Psychiatry | Admitting: Psychiatry

## 2018-10-28 ENCOUNTER — Encounter (HOSPITAL_COMMUNITY): Payer: Self-pay | Admitting: Emergency Medicine

## 2018-10-28 DIAGNOSIS — Z9114 Patient's other noncompliance with medication regimen: Secondary | ICD-10-CM

## 2018-10-28 DIAGNOSIS — F419 Anxiety disorder, unspecified: Secondary | ICD-10-CM | POA: Diagnosis present

## 2018-10-28 DIAGNOSIS — F250 Schizoaffective disorder, bipolar type: Secondary | ICD-10-CM | POA: Diagnosis present

## 2018-10-28 DIAGNOSIS — F17290 Nicotine dependence, other tobacco product, uncomplicated: Secondary | ICD-10-CM | POA: Diagnosis present

## 2018-10-28 DIAGNOSIS — Z62810 Personal history of physical and sexual abuse in childhood: Secondary | ICD-10-CM | POA: Diagnosis present

## 2018-10-28 DIAGNOSIS — Z79899 Other long term (current) drug therapy: Secondary | ICD-10-CM | POA: Diagnosis not present

## 2018-10-28 DIAGNOSIS — F1220 Cannabis dependence, uncomplicated: Secondary | ICD-10-CM | POA: Diagnosis present

## 2018-10-28 DIAGNOSIS — Z20828 Contact with and (suspected) exposure to other viral communicable diseases: Secondary | ICD-10-CM | POA: Diagnosis present

## 2018-10-28 DIAGNOSIS — G4700 Insomnia, unspecified: Secondary | ICD-10-CM | POA: Diagnosis present

## 2018-10-28 DIAGNOSIS — F319 Bipolar disorder, unspecified: Secondary | ICD-10-CM | POA: Diagnosis present

## 2018-10-28 MED ORDER — ACETAMINOPHEN 325 MG PO TABS
650.0000 mg | ORAL_TABLET | Freq: Four times a day (QID) | ORAL | Status: DC | PRN
  Administered 2018-10-31: 650 mg via ORAL

## 2018-10-28 MED ORDER — DIVALPROEX SODIUM 500 MG PO DR TAB
1000.0000 mg | DELAYED_RELEASE_TABLET | Freq: Every day | ORAL | Status: DC
  Administered 2018-10-28 – 2018-11-02 (×6): 1000 mg via ORAL
  Filled 2018-10-28 (×11): qty 2

## 2018-10-28 MED ORDER — BENZTROPINE MESYLATE 1 MG PO TABS
1.0000 mg | ORAL_TABLET | Freq: Two times a day (BID) | ORAL | Status: DC
  Administered 2018-10-28 – 2018-11-03 (×12): 1 mg via ORAL
  Filled 2018-10-28 (×5): qty 1
  Filled 2018-10-28 (×2): qty 14
  Filled 2018-10-28 (×4): qty 1
  Filled 2018-10-28 (×2): qty 14
  Filled 2018-10-28 (×6): qty 1

## 2018-10-28 MED ORDER — RISPERIDONE 3 MG PO TABS
3.0000 mg | ORAL_TABLET | Freq: Three times a day (TID) | ORAL | Status: DC
  Administered 2018-10-28 – 2018-10-31 (×9): 3 mg via ORAL
  Filled 2018-10-28 (×12): qty 1

## 2018-10-28 MED ORDER — MAGNESIUM HYDROXIDE 400 MG/5ML PO SUSP: 30.0000 mL | Freq: Every day | ORAL | Status: DC | PRN

## 2018-10-28 MED ORDER — ALUM & MAG HYDROXIDE-SIMETH 200-200-20 MG/5ML PO SUSP: 30.0000 mL | ORAL | Status: DC | PRN

## 2018-10-28 MED ORDER — RISPERIDONE 3 MG PO TABS
3.0000 mg | ORAL_TABLET | Freq: Every day | ORAL | Status: DC
  Filled 2018-10-28 (×3): qty 1

## 2018-10-28 MED ORDER — DIVALPROEX SODIUM 500 MG PO DR TAB
500.0000 mg | DELAYED_RELEASE_TABLET | Freq: Every day | ORAL | Status: DC
  Administered 2018-10-28 – 2018-11-02 (×6): 500 mg via ORAL
  Filled 2018-10-28 (×8): qty 1

## 2018-10-28 MED ORDER — LORAZEPAM 1 MG PO TABS
1.0000 mg | ORAL_TABLET | Freq: Four times a day (QID) | ORAL | Status: DC | PRN
  Administered 2018-10-28 – 2018-11-01 (×4): 1 mg via ORAL
  Filled 2018-10-28 (×3): qty 1

## 2018-10-28 MED ORDER — TRAZODONE HCL 100 MG PO TABS
200.0000 mg | ORAL_TABLET | Freq: Every day | ORAL | Status: DC
  Administered 2018-10-28 – 2018-11-02 (×6): 200 mg via ORAL
  Filled 2018-10-28 (×3): qty 2
  Filled 2018-10-28: qty 14
  Filled 2018-10-28 (×4): qty 2
  Filled 2018-10-28: qty 14

## 2018-10-28 NOTE — ED Notes (Signed)
Breakfast tray ordered 

## 2018-10-28 NOTE — Progress Notes (Signed)
Recreation Therapy Notes  Patient admitted to unit 7.3.20. Due to admission within last year, no new assessment conducted at this time. Last assessment conducted 6.16.20. Patient reports no changes in stressors from previous admission.    Patient reports reason for admission was her mother.  Patient expressed that she is going to live her life the way she wants to and she doesn't like the disrespect from her mother.  Patient denies SI, HI, AVH at this time. Patient reports goal of getting out.  Information found below from assessment conducted 6.16.20   Coping Skills:  Isolation, Write, TV, Sports, Arguments, Aggression, Music, Meditate, Deep Breathing, Impulsivity, Talk, Art, Prayer, Avoidance, Read, Dance, Hot Bath/Shower  Leisure Interests:  TV, Social Media, Family  Patient Strengths:  Voice; Ability to feel pain correctly  Areas of Improvement:  Talk to parents, Talk to wife, Talk to world different     Ivy Meriwether Ria Comment, LRT/CTRS    Victorino Sparrow A 10/28/2018 12:20 PM

## 2018-10-28 NOTE — Plan of Care (Signed)
mother interested in possible group home and/or some type of rehab due to the intense and chronic cannabis dependency leading to psychosis

## 2018-10-28 NOTE — Tx Team (Signed)
Initial Treatment Plan 10/28/2018 3:17 PM Samantha Ashley MVE:[id number]    PATIENT STRESSORS: Marital or family conflict Medication change or noncompliance Substance abuse   PATIENT STRENGTHS: Capable of independent living General fund of knowledge Supportive family/friends   PATIENT IDENTIFIED PROBLEMS: Non medication compliance  Substance abuse  Psychosis                 DISCHARGE CRITERIA:  Improved stabilization in mood, thinking, and/or behavior Verbal commitment to aftercare and medication compliance  PRELIMINARY DISCHARGE PLAN: Outpatient therapy Return to previous living arrangement  PATIENT/FAMILY INVOLVEMENT: This treatment plan has been presented to and reviewed with the patient, Samantha Ashley, and/or family member.  The patient and family have been given the opportunity to ask questions and make suggestions.  Harriet Masson, RN 10/28/2018, 3:17 PM

## 2018-10-28 NOTE — ED Notes (Signed)
Pt awake and requesting something to help her sleep. Pt is calm and cooperative.

## 2018-10-28 NOTE — ED Notes (Signed)
[employer] at bedside to transport patient to [facility]. Patient valuables collected from security and locker #5 and given to [facility] officer.

## 2018-10-28 NOTE — Progress Notes (Signed)
Nursing Progress Note: 7p-7a D: Pt currently presents with a labile/preoccupied/verbally aggressive/sarcastic affect and behavior. Interacting intrusively with the milieu. Pt reports fair sleep during the previous night with current medication regimen.  A: Pt provided with medications per providers orders. Pt's labs and vitals were monitored throughout the night. Pt supported emotionally and encouraged to express concerns and questions. Pt educated on medications.  R: Pt's safety ensured with 15 minute and environmental checks. Pt currently denies SI, HI, and AVH. Pt verbally contracts to seek staff if SI,HI, or AVH occurs and to consult with staff before acting on any harmful thoughts. Will continue to monitor.   [place] NOVEL CORONAVIRUS (COVID-19) DAILY CHECK-OFF SYMPTOMS - answer yes or no to each - every day NO YES  Have you had a fever in the past 24 hours?  . Fever (Temp > 37.80C / 100F) X   Have you had any of these symptoms in the past 24 hours? . New Cough .  Sore Throat  .  Shortness of Breath .  Difficulty Breathing .  Unexplained Body Aches   X   Have you had any one of these symptoms in the past 24 hours not related to allergies?   . Runny Nose .  Nasal Congestion .  Sneezing   X   If you have had runny nose, nasal congestion, sneezing in the past 24 hours, has it worsened?  X   EXPOSURES - check yes or no X   Have you traveled outside the state in the past 14 days?  X   Have you been in contact with someone with a confirmed diagnosis of COVID-19 or PUI in the past 14 days without wearing appropriate PPE?  X   Have you been living in the same home as a person with confirmed diagnosis of COVID-19 or a PUI (household contact)?    X   Have you been diagnosed with COVID-19?    X              What to do next: Answered NO to all: Answered YES to anything:   Proceed with unit schedule Follow the [facility] Inpatient Flowsheet.
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
## 2018-10-28 NOTE — ED Provider Notes (Signed)
Emergency Medicine Observation Re-evaluation Note  Samantha Ashley is a 23 y.o. female, seen on rounds today.  Pt initially presented to the ED for complaints of IVC Currently, the patient is walking with sitter, awake, alert, pleasant.  Patient is confused why she has not seen a mental health professional in person beyond her initial telemetry assessment.  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  Patient reports that he mental health because she is interested in her own disease process as well as helping others.  Patient has a list of phone numbers in her shirt pocket, unclear why she has these numbers written out however puts them in her patient belonging bag and states this is where she needs to put things that are important and need to be kept safe.  Patient also has her patient ID bracelet as well as a receipt.  Physical Exam  BP 126/70 (BP Location: Left Arm)   Pulse 98   Temp 98.3 F (36.8 C) (Oral)   Resp 18   Ht 5\' 4"  (1.626 m)   Wt 108.9 kg   SpO2 100%   BMI 41.20 kg/m  Physical Exam Awake, alert, respirations even and unlabored, steady gait ED Course / MDM  EKG:EKG Interpretation  Date/Time:  Wednesday October 26 2018 10:33:48 EDT Ventricular Rate:  83 PR Interval:  150 QRS Duration: 86 QT Interval:  404 QTC Calculation: 474 R Axis:   145 Text Interpretation:  indeterminate rhythm Right axis deviation Abnormal ECG If clinical findings warrant, serial tracings suggested Confirmed by Daleen Bo [phone]) on 10/27/2018 12:04:19 PM  Clinical Course as of Oct 28 934  Tue Oct 25, 2018  1235 ED EKG [MF]  1757 I was contacted by RN regarding patient's increased agitation. Reportedly pulling pants down, throwing water everyone. Home meds ordered. Zyprexa and ativan IM ordered. Will monitor before escalating any more meds.    [CG]    Clinical Course User Index [CG] Kinnie Feil, PA-C [MF] Rodell Perna A, PA-C   I  have reviewed the labs performed to date as well as medications administered while in observation.  Recent changes in the last 24 hours include intermittent anxiety, requests ativan. Plan  Current plan is for in patient treatment, awaiting placement. Patient is under full IVC at this time.   Tacy Learn, PA-C 10/28/18 [place], [place], DO 10/28/18 1252

## 2018-10-28 NOTE — Progress Notes (Signed)
Patient ID: Samantha Ashley, female   DOB: 09/20/1995, 22 y.o.   MRN: [id number]   [place] NOVEL CORONAVIRUS (COVID-19) DAILY CHECK-OFF SYMPTOMS - answer yes or no to each - every day NO YES  Have you had a fever in the past 24 hours?  . Fever (Temp > 37.80C / 100F) X   Have you had any of these symptoms in the past 24 hours? . New Cough .  Sore Throat  .  Shortness of Breath .  Difficulty Breathing .  Unexplained Body Aches   X   Have you had any one of these symptoms in the past 24 hours not related to allergies?   . Runny Nose .  Nasal Congestion .  Sneezing   X   If you have had runny nose, nasal congestion, sneezing in the past 24 hours, has it worsened?  X   EXPOSURES - check yes or no X   Have you traveled outside the state in the past 14 days?  X   Have you been in contact with someone with a confirmed diagnosis of COVID-19 or PUI in the past 14 days without wearing appropriate PPE?  X   Have you been living in the same home as a person with confirmed diagnosis of COVID-19 or a PUI (household contact)?    X   Have you been diagnosed with COVID-19?    X              What to do next: Answered NO to all: Answered YES to anything:   Proceed with unit schedule Follow the [facility] Inpatient Flowsheet.   

## 2018-10-28 NOTE — ED Notes (Signed)
Patient was given a snack and drink. 

## 2018-10-28 NOTE — [facility] Suicide Risk Assessment (Signed)
[facility] Admission Suicide Risk Assessment    Total Time spent with patient: 45 minutes Principal Problem: <principal problem not specified> Diagnosis:  Active Problems:   Schizoaffective disorder, bipolar type (HCC)   Bipolar 1 disorder, depressed (HCC)  Subjective Data: relapse- MJ dep   Continued Clinical Symptoms:  Alcohol Use Disorder Identification Test Final Score (AUDIT): 3 The "Alcohol Use Disorders Identification Test", Guidelines for Use in Primary Care, Second Edition.  World Pharmacologist [facility]). Score between 0-7:  no or low risk or alcohol related problems. Score between 8-15:  moderate risk of alcohol related problems. Score between 16-19:  high risk of alcohol related problems. Score 20 or above:  warrants further diagnostic evaluation for alcohol dependence and treatment.   CLINICAL FACTORS:   Bipolar Disorder:   [place] Personality Disorders:   Cluster B   Musculoskeletal: Strength & Muscle Tone: within normal limits Gait & Station: normal Patient leans: N/A  Psychiatric Specialty Exam: Physical Exam  Nursing note and vitals reviewed. Constitutional: She appears well-developed and well-nourished.    Review of Systems  Constitutional: Negative.   HENT: Negative.   Eyes: Negative.   Respiratory: Negative.   Cardiovascular: Negative.   Genitourinary: Negative.   Musculoskeletal: Negative.   Skin: Negative.   Neurological: Negative.   Endo/Heme/Allergies: Negative.     Blood pressure 121/82, pulse 82, temperature 98.2 F (36.8 C), temperature source Oral, resp. rate 16, height 5\' 4"  (1.626 m), weight 108 kg, SpO2 100 %.Body mass index is 40.87 kg/m.  General Appearance: Casual  Eye Contact:  Fair  Speech:  Pressured  Volume:  Increased  Mood:  hypomanic  Affect:  Congruent and Full Range  Thought Process:  Linear and Descriptions of Associations: Loose  Orientation:  Full (Time, Place, and Person)  Thought Content:  Illogical and Delusions   Suicidal Thoughts:  No  Homicidal Thoughts:  No  Memory:  Immediate;   Poor  Judgement:  Impaired  Insight:  Lacking  Psychomotor Activity:  Normal  Concentration:  Attention Span: Fair  Recall:  [place] of Knowledge:  Good  Language:  Good  Akathisia:  Negative  Handed:  Right  AIMS (if indicated):     Assets:  Communication Skills Desire for Improvement Financial Resources/Insurance Housing Leisure Time Physical Health Resilience  ADL's:  Intact  Cognition:  WNL  Sleep:         COGNITIVE FEATURES THAT CONTRIBUTE TO RISK:  Loss of executive function    SUICIDE RISK:   Minimal: No identifiable suicidal ideation.  Patients presenting with no risk factors but with morbid ruminations; may be classified as minimal risk based on the severity of the depressive symptoms  PLAN OF CARE: med restarted  I certify that inpatient services furnished can reasonably be expected to improve the patient's condition.   Johnn Hai, MD 10/28/2018, 12:24 PM

## 2018-10-28 NOTE — Progress Notes (Signed)
Patient ID: [name], female   DOB: 09-Aug-1995, 23 y.o.   MRN: [id number]  D: Pt alert and oriented during [facility] admission process. Pt denies SI/HI, A/VH, and any pain. Pt is cooperative.  A: Education, support, reassurance, and encouragement provided, q15 minute safety checks initiated. Pt's belongings in locker # 65.    "[name] is an 23 y.o. female presenting under IVC due to altered mental status. Per mother patient was "ranting" at [employer], then came home and lit sparklers in her home. Mother reported patient grabbed Electrical engineer and awkwardly holding it towards mothers direction. Patient was having unusual conversation with her cat and then kicked cat in the face. Patient was walking around nude during conversation with mother. Patient exhibited flight of ideas during assessment and disorganized speech at times. Patient reported no SI and no HI.  Patient recently discharged from [facility] on 10/10/2018 and 10/15/2018."  R: Pt denies any concerns at this time, and verbally contracts for safety. Pt ambulating on the unit with no issues. Pt remains safe on the unit.

## 2018-10-29 LAB — TSH: TSH: 1.563 u[IU]/mL (ref 0.350–4.500)

## 2018-10-29 LAB — LIPID PANEL
Cholesterol: 93 mg/dL (ref 0–200)
HDL: 24 mg/dL — ABNORMAL LOW (ref >40)
LDL Cholesterol: 54 mg/dL (ref 0–99)
Total CHOL/HDL Ratio: 3.9 RATIO
Triglycerides: 75 mg/dL (ref <150)
VLDL: 15 mg/dL (ref 0–40)

## 2018-10-29 LAB — VALPROIC ACID LEVEL: Valproic Acid Lvl: 79 ug/mL (ref 50.0–100.0)
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
## 2018-10-29 NOTE — Progress Notes (Signed)
Patient ID: Leza N Meenan, female   DOB: 12/07/1995, 22 y.o.   MRN: [id number]   [place] NOVEL CORONAVIRUS (COVID-19) DAILY CHECK-OFF SYMPTOMS - answer yes or no to each - every day NO YES  Have you had a fever in the past 24 hours?  . Fever (Temp > 37.80C / 100F) X   Have you had any of these symptoms in the past 24 hours? . New Cough .  Sore Throat  .  Shortness of Breath .  Difficulty Breathing .  Unexplained Body Aches   X   Have you had any one of these symptoms in the past 24 hours not related to allergies?   . Runny Nose .  Nasal Congestion .  Sneezing   X   If you have had runny nose, nasal congestion, sneezing in the past 24 hours, has it worsened?  X   EXPOSURES - check yes or no X   Have you traveled outside the state in the past 14 days?  X   Have you been in contact with someone with a confirmed diagnosis of COVID-19 or PUI in the past 14 days without wearing appropriate PPE?  X   Have you been living in the same home as a person with confirmed diagnosis of COVID-19 or a PUI (household contact)?    X   Have you been diagnosed with COVID-19?    X              What to do next: Answered NO to all: Answered YES to anything:   Proceed with unit schedule Follow the [facility] Inpatient Flowsheet.   
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
## 2018-10-29 NOTE — Plan of Care (Signed)
D: Pt alert and oriented on the unit. Pt engaging with RN staff and other pts. Pt denies SI/HI, A/VH. Pt's affect was flat Pt became upset with another pt after being mocked. Pt also yelled, cursed, and slammed her room door after becoming upset while talking to her parents on the phone.  A: Education, support and encouragement provided, q15 minute safety checks remain in effect. Medications administered per MD orders. R: No reactions/side effects to medicine noted. Pt denies any concerns at this time, and verbally contracts for safety. Pt ambulating on the unit with no issues. Pt remains safe on and off the unit.   Problem: Coping: Goal: Ability to verbalize frustrations and anger appropriately will improve Outcome: Not Progressing Goal: Ability to demonstrate self-control will improve Outcome: Not Progressing

## 2018-10-29 NOTE — [facility] Group Notes (Signed)
Group was deferred for outdoor time today.  Voris Tigert Grossman-Orr, LCSW 10/29/2018, 12:03 PM   

## 2018-10-30 MED ORDER — FLUTICASONE PROPIONATE 50 MCG/ACT NA SUSP
2.0000 | Freq: Every day | NASAL | Status: DC
  Administered 2018-10-30 – 2018-11-03 (×5): 2 via NASAL
  Filled 2018-10-30 (×2): qty 16

## 2018-10-30 NOTE — Progress Notes (Signed)
D:  Patient's self inventory sheet, patient sleeps good, no sleep medication.  Good appetite, hyper energy level, good concentration.  Denied depression zero.  Rated hopeless and anxiety 10.    Denied withdrawals.  Withdrawals checked, sedation, chilling, agitation, nausea, runny nose, irritability, stuffed.  Denied SI.  Physical problems, lightheaded, pain, dizzy, headaches, blurred vision.  Physical pain, worst pain #10, feet.  No pain medicine.  Goal is speak for others.  Plans to speak for Ariel.  "Thank you for being you."  Does have discharge plans. A:  Medications administered per MD orders.  Emotional support and encouragement given patient. R:  Patient denied SI and HI, contracts for safety.  Denied A/V hallucinations.  Safety maintained with 15 minute checks. Marland Kitchen

## 2018-10-30 NOTE — Progress Notes (Signed)
Patient would like nasal spray for congestion.
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
## 2018-10-30 NOTE — Progress Notes (Signed)
DAR NOTE: Patient presents with calm affect and flat mood. Pt stated she feels better today, pt stated she got upset with her dad early in the day but feels remorse about the situation. Denies pain, auditory and visual hallucinations. Maintained on routine safety checks.  Medications given as prescribed.  Support and encouragement offered as needed.   Will continue to monitor.

## 2018-10-30 NOTE — [facility] Group Notes (Signed)
[facility] LCSW Group Therapy Note  Date/Time:  10/30/2018  11:00AM-12:00PM  Type of Therapy and Topic:  Group Therapy:  Music and Mood  Participation Level:  Active   Description of Group: In this process group, members listened to a variety of genres of music and identified that different types of music evoke different responses.  Patients were encouraged to identify music that was soothing for them and music that was energizing for them.  Patients discussed how this knowledge can help with wellness and recovery in various ways including managing depression and anxiety as well as encouraging healthy sleep habits.    Therapeutic Goals: 1. Patients will explore the impact of different varieties of music on mood 2. Patients will verbalize the thoughts they have when listening to different types of music 3. Patients will identify music that is soothing to them as well as music that is energizing to them 4. Patients will discuss how to use this knowledge to assist in maintaining wellness and recovery 5. Patients will explore the use of music as a coping skill  Summary of Patient Progress:  At the beginning of group, patient expressed that she felt empowered, with anxiety at a 6-7 on a 1-10 scale.  At the end of group she said her anxiety was at a 0.  Therapeutic Modalities: Solution Focused Brief Therapy Activity   Selmer Dominion, LCSW

## 2018-10-30 NOTE — Progress Notes (Signed)
D    Pt in bed most of this evening   No distress noted no complaints voiced A   Q15 min checks  Verbal support and medications offered R   Pt remains safe at this time  [place] NOVEL CORONAVIRUS (COVID-19) DAILY CHECK-OFF SYMPTOMS - answer yes or no to each - every day NO YES  Have you had a fever in the past 24 hours?  . Fever (Temp > 37.80C / 100F) X   Have you had any of these symptoms in the past 24 hours? . New Cough .  Sore Throat  .  Shortness of Breath .  Difficulty Breathing .  Unexplained Body Aches   X   Have you had any one of these symptoms in the past 24 hours not related to allergies?   . Runny Nose .  Nasal Congestion .  Sneezing   X   If you have had runny nose, nasal congestion, sneezing in the past 24 hours, has it worsened?  X   EXPOSURES - check yes or no X   Have you traveled outside the state in the past 14 days?  X   Have you been in contact with someone with a confirmed diagnosis of COVID-19 or PUI in the past 14 days without wearing appropriate PPE?  X   Have you been living in the same home as a person with confirmed diagnosis of COVID-19 or a PUI (household contact)?    X   Have you been diagnosed with COVID-19?    X              What to do next: Answered NO to all: Answered YES to anything:   Proceed with unit schedule Follow the [facility] Inpatient Flowsheet.

## 2018-10-30 NOTE — Plan of Care (Signed)
Nurse discussed anxiety, depression and coping skills. 

## 2018-10-31 LAB — PROLACTIN: Prolactin: 98.3 ng/mL — ABNORMAL HIGH (ref 4.8–23.3)

## 2018-10-31 MED ORDER — HALOPERIDOL 5 MG PO TABS
5.0000 mg | ORAL_TABLET | Freq: Three times a day (TID) | ORAL | Status: DC
  Administered 2018-10-31 – 2018-11-01 (×3): 5 mg via ORAL
  Filled 2018-10-31 (×6): qty 1

## 2018-10-31 MED ORDER — CLONAZEPAM 0.5 MG PO TABS
1.5000 mg | ORAL_TABLET | Freq: Once | ORAL | Status: AC
  Administered 2018-10-31: 1.5 mg via ORAL
  Filled 2018-10-31: qty 3

## 2018-10-31 NOTE — Progress Notes (Signed)
[facility] MD Progress Note  10/31/2018 10:18 AM Samantha Ashley  MRN:  [id number] Subjective:   Remains intrusive pressured hypomanic and singularly focused on discharge yet making disjointed statements and not fully appreciating or having good insight into her condition.  But denies thoughts of harming self or others.  No EPS or TD  Principal Problem: Noncompliance/cannabis abuse and dependency relapse regarding bipolar/schizoaffective symptomatology Diagnosis: Active Problems:   Schizoaffective disorder, bipolar type (HCC)   Bipolar 1 disorder, depressed ([place])  Total Time spent with patient: 20 minutes  Past Psychiatric History: see eval  Past Medical History:  Past Medical History:  Diagnosis Date  . Asthma    History reviewed. No pertinent surgical history. Family History: History reviewed. No pertinent family history. Family Psychiatric  History: see eval Social History:  Social History   Substance and Sexual Activity  Alcohol Use No     Social History   Substance and Sexual Activity  Drug Use Yes  . Types: Marijuana    Social History   Socioeconomic History  . Marital status: Single    Spouse name: Not on file  . Number of children: Not on file  . Years of education: Not on file  . Highest education level: Not on file  Occupational History  . Not on file  Social Needs  . Financial resource strain: Not on file  . Food insecurity    Worry: Not on file    Inability: Not on file  . Transportation needs    Medical: Not on file    Non-medical: Not on file  Tobacco Use  . Smoking status: Current Every Day Smoker    Types: Cigars  . Smokeless tobacco: Never Used  Substance and Sexual Activity  . Alcohol use: No  . Drug use: Yes    Types: Marijuana  . Sexual activity: Yes  Lifestyle  . Physical activity    Days per week: Not on file    Minutes per session: Not on file  . Stress: Not on file  Relationships  . Social Herbalist on phone: Not on file     Gets together: Not on file    Attends religious service: Not on file    Active member of club or organization: Not on file    Attends meetings of clubs or organizations: Not on file    Relationship status: Not on file  Other Topics Concern  . Not on file  Social History Narrative  . Not on file   Additional Social History:                         Sleep: Good  Appetite:  Good  Current Medications: Current Facility-Administered Medications  Medication Dose Route Frequency Provider Last Rate Last Dose  . acetaminophen (TYLENOL) tablet 650 mg  650 mg Oral Q6H PRN Dixon, Rashaun M, NP      . alum & mag hydroxide-simeth (MAALOX/MYLANTA) 200-200-20 MG/5ML suspension 30 mL  30 mL Oral Q4H PRN Dixon, Rashaun M, NP      . benztropine (COGENTIN) tablet 1 mg  1 mg Oral BID Deloria Lair, NP   1 mg at 10/31/18 0816  . divalproex (DEPAKOTE) DR tablet 1,000 mg  1,000 mg Oral QHS Johnn Hai, MD   1,000 mg at 10/30/18 2333  . divalproex (DEPAKOTE) DR tablet 500 mg  500 mg Oral Q1200 Johnn Hai, MD   500 mg at 10/30/18 1258  .  fluticasone (FLONASE) 50 MCG/ACT nasal spray 2 spray  2 spray Each Nare Daily Antonieta Pertlary, Greg Lawson, MD   2 spray at 10/31/18 [phone]  . LORazepam (ATIVAN) tablet 1 mg  1 mg Oral Q6H PRN Jearld Leschixon, Rashaun M, NP   1 mg at 10/29/18 1815  . magnesium hydroxide (MILK OF MAGNESIA) suspension 30 mL  30 mL Oral Daily PRN Jearld Leschixon, Rashaun M, NP      . risperiDONE (RISPERDAL) tablet 3 mg  3 mg Oral TID Malvin JohnsFarah, Tay Whitwell, MD   3 mg at 10/31/18 0816  . traZODone (DESYREL) tablet 200 mg  200 mg Oral QHS Dixon, Rashaun M, NP   200 mg at 10/30/18 2334    Lab Results: No results found for this or any previous visit (from the past 48 hour(s)).  Blood Alcohol level:  Lab Results  Component Value Date   ETH <10 10/25/2018   ETH <10 10/10/2018    Metabolic Disorder Labs: Lab Results  Component Value Date   HGBA1C 4.9 10/11/2018   MPG 93.93 10/11/2018   Lab Results  Component  Value Date   PROLACTIN 98.3 (H) 10/29/2018   PROLACTIN 52.5 (H) 10/11/2018   Lab Results  Component Value Date   CHOL 93 10/29/2018   TRIG 75 10/29/2018   HDL 24 (L) 10/29/2018   CHOLHDL 3.9 10/29/2018   VLDL 15 10/29/2018   LDLCALC 54 10/29/2018   LDLCALC 67 10/11/2018    Physical Findings: AIMS: Facial and Oral Movements Muscles of Facial Expression: None, normal Lips and Perioral Area: None, normal Jaw: None, normal Tongue: None, normal,Extremity Movements Upper (arms, wrists, hands, fingers): None, normal Lower (legs, knees, ankles, toes): None, normal, Trunk Movements Neck, shoulders, hips: None, normal, Overall Severity Severity of abnormal movements (highest score from questions above): None, normal Incapacitation due to abnormal movements: None, normal Patient's awareness of abnormal movements (rate only patient's report): No Awareness, Dental Status Current problems with teeth and/or dentures?: No Does patient usually wear dentures?: No  CIWA:  CIWA-Ar Total: 1 COWS:  COWS Total Score: 2  Musculoskeletal: Strength & Muscle Tone: nl Gait & Station: normal Patient leans: N/A  Psychiatric Specialty Exam: Physical Exam  ROS  Blood pressure 118/89, pulse 96, temperature (!) 97.5 F (36.4 C), resp. rate 17, height 5\' 4"  (1.626 m), weight 108 kg, SpO2 100 %.Body mass index is 40.87 kg/m.  General Appearance: Casual  Eye Contact:  Fair  Speech:  Pressured  Volume:  Increased  Mood:  hypomanic  Affect:  Constricted  Thought Process:  Irrelevant and Descriptions of Associations: Loose  Orientation:  Full (Time, Place, and Person)  Thought Content:  Illogical and Delusions  Suicidal Thoughts:  No  Homicidal Thoughts:  No  Memory:  Immediate;   Fair  Judgement:  Fair  Insight:  Lacking and Shallow  Psychomotor Activity:  Normal  Concentration:  Concentration: Poor  Recall:  Poor  Fund of Knowledge:  Poor  Language:  Good  Akathisia:  Negative  Handed:   Right  AIMS (if indicated):     Assets:  Physical Health Resilience Social Support  ADL's:  Intact  Cognition:  WNL  Sleep:  Number of Hours: 5.75     Treatment Plan Summary: Daily contact with patient to assess and evaluate symptoms and progress in treatment Failing to respond to risperidone we will try haloperidol to speed response see orders  Malvin JohnsFARAH,Dequavius Kuhner, MD 10/31/2018, 10:18 AM

## 2018-10-31 NOTE — Tx Team (Signed)
Interdisciplinary Treatment and Diagnostic Plan Update  10/31/2018 Time of Session: 11:12am Samantha Ashley MRN: [id number]  Principal Diagnosis: <principal problem not specified>  Secondary Diagnoses: Active Problems:   Schizoaffective disorder, bipolar type (HCC)   Bipolar 1 disorder, depressed (HCC)   Current Medications:  Current Facility-Administered Medications  Medication Dose Route Frequency Provider Last Rate Last Dose  . acetaminophen (TYLENOL) tablet 650 mg  650 mg Oral Q6H PRN Dixon, Rashaun M, NP      . alum & mag hydroxide-simeth (MAALOX/MYLANTA) 200-200-20 MG/5ML suspension 30 mL  30 mL Oral Q4H PRN Dixon, Rashaun M, NP      . benztropine (COGENTIN) tablet 1 mg  1 mg Oral BID Jearld Leschixon, Rashaun M, NP   1 mg at 10/31/18 0816  . divalproex (DEPAKOTE) DR tablet 1,000 mg  1,000 mg Oral QHS Malvin JohnsFarah, Brian, MD   1,000 mg at 10/30/18 2333  . divalproex (DEPAKOTE) DR tablet 500 mg  500 mg Oral Q1200 Malvin JohnsFarah, Brian, MD   500 mg at 10/31/18 1209  . fluticasone (FLONASE) 50 MCG/ACT nasal spray 2 spray  2 spray Each Nare Daily Antonieta Pertlary, Greg Lawson, MD   2 spray at 10/31/18 0816  . haloperidol (HALDOL) tablet 5 mg  5 mg Oral TID Malvin JohnsFarah, Brian, MD   5 mg at 10/31/18 1209  . LORazepam (ATIVAN) tablet 1 mg  1 mg Oral Q6H PRN Jearld Leschixon, Rashaun M, NP   1 mg at 10/29/18 1815  . magnesium hydroxide (MILK OF MAGNESIA) suspension 30 mL  30 mL Oral Daily PRN Jearld Leschixon, Rashaun M, NP      . traZODone (DESYREL) tablet 200 mg  200 mg Oral QHS Dixon, Rashaun M, NP   200 mg at 10/30/18 2334   PTA Medications: Medications Prior to Admission  Medication Sig Dispense Refill Last Dose  . benztropine (COGENTIN) 1 MG tablet Take 1 tablet (1 mg total) by mouth 2 (two) times daily. 60 tablet 2   . divalproex (DEPAKOTE ER) 500 MG 24 hr tablet 1 in am 2 at h s (Patient taking differently: Take 500 mg by mouth 2 (two) times a day. 1 in the morning and 2 at bedtime) 90 tablet 2   . omega-3 acid ethyl esters (LOVAZA) 1 g capsule  Take 1 capsule (1 g total) by mouth 2 (two) times daily. 60 capsule 2   . Prenatal Vit-Fe Fumarate-FA (PRENATAL MULTIVITAMIN) TABS tablet Take 1 tablet by mouth daily. (Patient not taking: Reported on 10/16/2018) 90 tablet 2   . propranolol (INDERAL) 40 MG tablet Take 1.5 tablets (60 mg total) by mouth 2 (two) times daily. 60 tablet 3   . risperiDONE (RISPERDAL) 3 MG tablet 1 in am 2 at hs 90 tablet 2   . risperiDONE microspheres (RISPERDAL CONSTA) 50 MG injection Inject 2 mLs (50 mg total) into the muscle every 14 (fourteen) days. Due 7/16 1 each 11   . traZODone (DESYREL) 100 MG tablet Take 2 tablets (200 mg total) by mouth at bedtime. 60 tablet 2     Patient Stressors: Marital or family conflict Medication change or noncompliance Substance abuse  Patient Strengths: Capable of independent living General fund of knowledge Supportive family/friends  Treatment Modalities: Medication Management, Group therapy, Case management,  1 to 1 session with clinician, Psychoeducation, Recreational therapy.   Physician Treatment Plan for Primary Diagnosis: <principal problem not specified> Long Term Goal(s): Improvement in symptoms so as ready for discharge Improvement in symptoms so as ready for discharge   Short Term  Goals: Ability to disclose and discuss suicidal ideas Ability to demonstrate self-control will improve Ability to identify and develop effective coping behaviors will improve Ability to identify and develop effective coping behaviors will improve Ability to maintain clinical measurements within normal limits will improve Compliance with prescribed medications will improve  Medication Management: Evaluate patient's response, side effects, and tolerance of medication regimen.  Therapeutic Interventions: 1 to 1 sessions, Unit Group sessions and Medication administration.  Evaluation of Outcomes: Progressing  Physician Treatment Plan for Secondary Diagnosis: Active Problems:    Schizoaffective disorder, bipolar type (HCC)   Bipolar 1 disorder, depressed (HCC)  Long Term Goal(s): Improvement in symptoms so as ready for discharge Improvement in symptoms so as ready for discharge   Short Term Goals: Ability to disclose and discuss suicidal ideas Ability to demonstrate self-control will improve Ability to identify and develop effective coping behaviors will improve Ability to identify and develop effective coping behaviors will improve Ability to maintain clinical measurements within normal limits will improve Compliance with prescribed medications will improve     Medication Management: Evaluate patient's response, side effects, and tolerance of medication regimen.  Therapeutic Interventions: 1 to 1 sessions, Unit Group sessions and Medication administration.  Evaluation of Outcomes: Progressing   RN Treatment Plan for Primary Diagnosis: <principal problem not specified> Long Term Goal(s): Knowledge of disease and therapeutic regimen to maintain health will improve  Short Term Goals: Ability to participate in decision making will improve, Ability to verbalize feelings will improve, Ability to disclose and discuss suicidal ideas, Ability to identify and develop effective coping behaviors will improve and Compliance with prescribed medications will improve  Medication Management: RN will administer medications as ordered by provider, will assess and evaluate patient's response and provide education to patient for prescribed medication. RN will report any adverse and/or side effects to prescribing provider.  Therapeutic Interventions: 1 on 1 counseling sessions, Psychoeducation, Medication administration, Evaluate responses to treatment, Monitor vital signs and CBGs as ordered, Perform/monitor CIWA, COWS, AIMS and Fall Risk screenings as ordered, Perform wound care treatments as ordered.  Evaluation of Outcomes: Progressing   LCSW Treatment Plan for Primary  Diagnosis: <principal problem not specified> Long Term Goal(s): Safe transition to appropriate next level of care at discharge, Engage patient in therapeutic group addressing interpersonal concerns.  Short Term Goals: Engage patient in aftercare planning with referrals and resources and Increase skills for wellness and recovery  Therapeutic Interventions: Assess for all discharge needs, 1 to 1 time with Social worker, Explore available resources and support systems, Assess for adequacy in community support network, Educate family and significant other(s) on suicide prevention, Complete Psychosocial Assessment, Interpersonal group therapy.  Evaluation of Outcomes: Progressing   Progress in Treatment: Attending groups: Yes. Participating in groups: Yes. Taking medication as prescribed: Yes. Toleration medication: Yes. Family/Significant other contact made: No, will contact:  pt's father Patient understands diagnosis: Yes. Discussing patient identified problems/goals with staff: Yes. Medical problems stabilized or resolved: Yes. Denies suicidal/homicidal ideation: Yes. Issues/concerns per patient self-inventory: No. Other:   New problem(s) identified: No, Describe:  None  New Short Term/Long Term Goal(s): Medication stabilization, elimination of SI thoughts, and development of a comprehensive mental wellness plan.   Patient Goals:    Discharge Plan or Barriers: CSW will continue to follow up for appropriate referrals and possible discharge planning  Reason for Continuation of Hospitalization: Aggression Anxiety Medication stabilization  Estimated Length of Stay: 2-3 days  Attendees: Patient: 10/31/2018  Physician: Dr. Malvin JohnsBrian Farah, MD 10/31/2018  Nursing: Vallery Ridge 10/31/2018   RN Care Manager: 10/31/2018   Social Worker: Ardelle Anton, [place] 10/31/2018   Recreational Therapist:  10/31/2018   Other:  10/31/2018   Other:  10/31/2018   Other: 10/31/2018      Scribe for Treatment  Team: [name], LCSW 10/31/2018 1:24 PM

## 2018-11-01 MED ORDER — HALOPERIDOL 5 MG PO TABS
5.0000 mg | ORAL_TABLET | Freq: Two times a day (BID) | ORAL | Status: DC
  Administered 2018-11-01 – 2018-11-03 (×4): 5 mg via ORAL
  Filled 2018-11-01 (×8): qty 1

## 2018-11-01 MED ORDER — HALOPERIDOL 5 MG PO TABS
10.0000 mg | ORAL_TABLET | Freq: Every day | ORAL | Status: DC
  Administered 2018-11-02 (×2): 10 mg via ORAL
  Filled 2018-11-01 (×4): qty 2

## 2018-11-01 NOTE — Plan of Care (Signed)
D: Patient is alert, oriented, pleasant, and cooperative. Denies SI, HI, AVH, and verbally contracts for safety.    A: Medications administered per MD order. Support provided. Patient educated on safety on the unit and medications. Routine safety checks every 15 minutes. Patient stated understanding to tell nurse about any new physical symptoms. Patient understands to tell staff of any needs.     R: No adverse drug reactions noted. Patient verbally contracts for safety. Patient remains safe at this time and will continue to monitor.   Problem: Safety: Goal: Periods of time without injury will increase Outcome: Progressing   Patient remains safe and will continue to monitor.  [place] NOVEL CORONAVIRUS (COVID-19) DAILY CHECK-OFF SYMPTOMS - answer yes or no to each - every day NO YES  Have you had a fever in the past 24 hours?  Fever (Temp > 37.80C / 100F) X   Have you had any of these symptoms in the past 24 hours? New Cough  Sore Throat   Shortness of Breath  Difficulty Breathing  Unexplained Body Aches   X   Have you had any one of these symptoms in the past 24 hours not related to allergies?   Runny Nose  Nasal Congestion  Sneezing   X   If you have had runny nose, nasal congestion, sneezing in the past 24 hours, has it worsened?  X   EXPOSURES - check yes or no X   Have you traveled outside the state in the past 14 days?  X   Have you been in contact with someone with a confirmed diagnosis of COVID-19 or PUI in the past 14 days without wearing appropriate PPE?  X   Have you been living in the same home as a person with confirmed diagnosis of COVID-19 or a PUI (household contact)?    X   Have you been diagnosed with COVID-19?    X              What to do next: Answered NO to all: Answered YES to anything:   Proceed with unit schedule Follow the [facility] Inpatient Flowsheet.

## 2018-11-01 NOTE — Progress Notes (Signed)
DAR NOTE: Patient presents with anxious affect and irritable mood.  Denies suicidal thoughts, auditory and visual hallucinations.  Patient walked out of group and became agitated due to her parent not understanding her explanation of PCP program.  Staff attempted to explain how program works but became more agitated.  Requested and received Ativan 1 mg with good effect.  Rates depression at 0, hopelessness at 0 , and anxiety at 5.  Maintained on routine safety checks.  Medications given as prescribed.  Support and encouragement offered as needed.  Attended group and participated.  States goal for today is "mind my business."  Patient observed socializing with peers in the dayroom.  Patient is safe on and off the unit.

## 2018-11-01 NOTE — Progress Notes (Signed)
Pt currently asleep in bed. Respiration are even and unlabored. Pt in no sign of distress. Will continue to monitor.    [place] NOVEL CORONAVIRUS (COVID-19) DAILY CHECK-OFF SYMPTOMS - answer yes or no to each - every day NO YES  Have you had a fever in the past 24 hours?  . Fever (Temp > 37.80C / 100F) X   Have you had any of these symptoms in the past 24 hours? . New Cough .  Sore Throat  .  Shortness of Breath .  Difficulty Breathing .  Unexplained Body Aches   X   Have you had any one of these symptoms in the past 24 hours not related to allergies?   . Runny Nose .  Nasal Congestion .  Sneezing   X   If you have had runny nose, nasal congestion, sneezing in the past 24 hours, has it worsened?  X   EXPOSURES - check yes or no X   Have you traveled outside the state in the past 14 days?  X   Have you been in contact with someone with a confirmed diagnosis of COVID-19 or PUI in the past 14 days without wearing appropriate PPE?  X   Have you been living in the same home as a person with confirmed diagnosis of COVID-19 or a PUI (household contact)?    X   Have you been diagnosed with COVID-19?    X              What to do next: Answered NO to all: Answered YES to anything:   Proceed with unit schedule Follow the [facility] Inpatient Flowsheet.    

## 2018-11-01 NOTE — [facility] Suicide Risk Assessment (Signed)
Samantha Ashley INPATIENT:  Family/Significant Other Suicide Prevention Education  Suicide Prevention Education:  Education Completed; pt's father, Samantha Ashley, has been identified by the patient as the family member/significant other with whom the patient will be residing, and identified as the person(s) who will aid the patient in the event of a mental health crisis (suicidal ideations/suicide attempt).  With written consent from the patient, the family member/significant other has been provided the following suicide prevention education, prior to the and/or following the discharge of the patient.  The suicide prevention education provided includes the following:  Suicide risk factors  Suicide prevention and interventions  National Suicide Hotline telephone number  [facility] assessment telephone number  [facility] Emergency Assistance [place] and/or Residential Mobile Crisis Unit telephone number  Request made of family/significant other to:  Remove weapons (e.g., guns, rifles, knives), all items previously/currently identified as safety concern.    Remove drugs/medications (over-the-counter, prescriptions, illicit drugs), all items previously/currently identified as a safety concern.  The family member/significant other verbalizes understanding of the suicide prevention education information provided.  The family member/significant other agrees to remove the items of safety concern listed above.  CSW contacted pt's father, Samantha Ashley. Pt's father stated that he talked to the doctor earlier and that the doctor is putting the patient on a different/stronger medication that will handle her mania. Pt's father stated that the pt was concerned about her sleep but apparently was confused about her sleeping patterning. CSW informed pt's father of a PHP program through [facility] that CSW will talk to pt about. Pt's father stated that will be the best for the pt  and her anxiety. Pt's father stated that she is missing classes with her school right now and will need a letter and possibly some information from the school.    Samantha Ashley 11/01/2018, 12:54 PM

## 2018-11-01 NOTE — Progress Notes (Signed)
[facility]  11/01/2018 10:55 AM Samantha Ashley  MRN:  [id number] Subjective:    Patient generally more contained in mood and behavior still rambles and has what would be considered a manic thought process No eps  No td Discussed cannabis role in pathology  Principal Problem: psychosis- cannabis dep Diagnosis: Active Problems:   Schizoaffective disorder, bipolar type (HCC)   Bipolar 1 disorder, depressed (HCC)  Total Time spent with patient: 20 minutes  Past Psychiatric History: 3rd admit  Past Medical History:  Past Medical History:  Diagnosis Date  . Asthma    History reviewed. No pertinent surgical history. Family History: History reviewed. No pertinent family history.  Social History:  Social History   Substance and Sexual Activity  Alcohol Use No     Social History   Substance and Sexual Activity  Drug Use Yes  . Types: Marijuana    Social History   Socioeconomic History  . Marital status: Single    Spouse name: Not on file  . Number of children: Not on file  . Years of education: Not on file  . Highest education level: Not on file  Occupational History  . Not on file  Social Needs  . Financial resource strain: Not on file  . Food insecurity    Worry: Not on file    Inability: Not on file  . Transportation needs    Medical: Not on file    Non-medical: Not on file  Tobacco Use  . Smoking status: Current Every Day Smoker    Types: Cigars  . Smokeless tobacco: Never Used  Substance and Sexual Activity  . Alcohol use: No  . Drug use: Yes    Types: Marijuana  . Sexual activity: Yes  Lifestyle  . Physical activity    Days per week: Not on file    Minutes per session: Not on file  . Stress: Not on file  Relationships  . Social Musicianconnections    Talks on phone: Not on file    Gets together: Not on file    Attends religious service: Not on file    Active member of club or organization: Not on file    Attends meetings of clubs or  organizations: Not on file    Relationship status: Not on file  Other Topics Concern  . Not on file  Social History Narrative  . Not on file   Additional Social History:                         Sleep: Good  Appetite:  Good  Current Medications: Current Facility-Administered Medications  Medication Dose Route Frequency Provider Last Rate Last Dose  . acetaminophen (TYLENOL) tablet 650 mg  650 mg Oral Q6H PRN Jearld Leschixon, Rashaun M, NP   650 mg at 10/31/18 1819  . alum & mag hydroxide-simeth (MAALOX/MYLANTA) 200-200-20 MG/5ML suspension 30 mL  30 mL Oral Q4H PRN Dixon, Rashaun M, NP      . benztropine (COGENTIN) tablet 1 mg  1 mg Oral BID Jearld Leschixon, Rashaun M, NP   1 mg at 11/01/18 0823  . divalproex (DEPAKOTE) DR tablet 1,000 mg  1,000 mg Oral QHS Malvin JohnsFarah, Swanson Farnell, MD   1,000 mg at 10/31/18 2123  . divalproex (DEPAKOTE) DR tablet 500 mg  500 mg Oral Q1200 Malvin JohnsFarah, Lawrie Tunks, MD   500 mg at 10/31/18 1209  . fluticasone (FLONASE) 50 MCG/ACT nasal spray 2 spray  2 spray Each Nare Daily  Sharma Covert, MD   2 spray at 11/01/18 [phone]  . haloperidol (HALDOL) tablet 10 mg  10 mg Oral QHS Johnn Hai, MD      . haloperidol (HALDOL) tablet 5 mg  5 mg Oral BID Johnn Hai, MD      . LORazepam (ATIVAN) tablet 1 mg  1 mg Oral Q6H PRN Deloria Lair, NP   1 mg at 10/29/18 1815  . magnesium hydroxide (MILK OF MAGNESIA) suspension 30 mL  30 mL Oral Daily PRN Deloria Lair, NP      . traZODone (DESYREL) tablet 200 mg  200 mg Oral QHS Dixon, Rashaun M, NP   200 mg at 10/31/18 2124    Lab Results: No results found for this or any previous visit (from the past 48 hour(s)).  Blood Alcohol level:  Lab Results  Component Value Date   ETH <10 10/25/2018   ETH <10 24/58/0998    Metabolic Disorder Labs: Lab Results  Component Value Date   HGBA1C 4.9 10/11/2018   MPG 93.93 10/11/2018   Lab Results  Component Value Date   PROLACTIN 98.3 (H) 10/29/2018   PROLACTIN 52.5 (H) 10/11/2018   Lab  Results  Component Value Date   CHOL 93 10/29/2018   TRIG 75 10/29/2018   HDL 24 (L) 10/29/2018   CHOLHDL 3.9 10/29/2018   VLDL 15 10/29/2018   LDLCALC 54 10/29/2018   LDLCALC 67 10/11/2018    Physical Findings: AIMS: Facial and Oral Movements Muscles of Facial Expression: None, normal Lips and Perioral Area: None, normal Jaw: None, normal Tongue: None, normal,Extremity Movements Upper (arms, wrists, hands, fingers): None, normal Lower (legs, knees, ankles, toes): None, normal, Trunk Movements Neck, shoulders, hips: None, normal, Overall Severity Severity of abnormal movements (highest score from questions above): None, normal Incapacitation due to abnormal movements: None, normal Patient's awareness of abnormal movements (rate only patient's report): No Awareness, Dental Status Current problems with teeth and/or dentures?: No Does patient usually wear dentures?: No  CIWA:  CIWA-Ar Total: 1 COWS:  COWS Total Score: 2  Musculoskeletal: Strength & Muscle Tone: within normal limits Gait & Station: normal Patient leans: N/A  Psychiatric Specialty Exam: Physical Exam  ROS  Blood pressure (!) 111/57, pulse (!) 118, temperature (!) 97.5 F (36.4 C), resp. rate 20, height 5\' 4"  (1.626 m), weight 108 kg, SpO2 100 %.Body mass index is 40.87 kg/m.  General Appearance: Casual  Eye Contact:  Good  Speech:  Clear and Coherent  Volume:  Normal  Mood:  hypomanic  Affect:  Congruent and Full Range  Thought Process:  Irrelevant and Descriptions of Associations: Loose  Orientation:  Full (Time, Place, and Person)  Thought Content:  Illogical  Suicidal Thoughts:  No  Homicidal Thoughts:  No  Memory:  Immediate;   Poor  Judgement:  Impaired  Insight:  Shallow  Psychomotor Activity:  Normal  Concentration:  Concentration: Fair  Recall:  [employer] of Knowledge:  Fair  Language:  Fair  Akathisia:  Negative  Handed:  Right  AIMS (if indicated):     Assets:  Physical  Health Resilience  ADL's:  Intact  Cognition:  WNL  Sleep:  Number of Hours: 5.75     Treatment Plan Summary: Daily contact with patient to assess and evaluate symptoms and progress in treatment and Medication management- escalate haldol- cont reality based therapy- speak w parents w permission- Discussed rehabJohnn Hai, MD 11/01/2018, 10:55 AM

## 2018-11-02 NOTE — Progress Notes (Signed)
[facility] MD Progress Note  11/02/2018 11:58 AM Samantha Ashley  MRN:  [id number] Subjective:   Patient showing some improvement as far as containment in mood and behavior without thoughts of self-harm and can contract focused on discharge so minimizing symptoms but genuinely does seem improved at this point in time.  No change in meds however spoke with family and they fear that prolonging hospital stay certainly could make her worse but we will shoot for tomorrow as her discharge date.  Principal Problem: mania/cannabis dep Diagnosis: Active Problems:   Schizoaffective disorder, bipolar type (HCC)   Bipolar 1 disorder, depressed ([place])  Total Time spent with patient: 20 minutes  Past Medical History:  Past Medical History:  Diagnosis Date  . Asthma    History reviewed. No pertinent surgical history. Family History: History reviewed. No pertinent family history. Family Psychiatric  History: no new Social History:  Social History   Substance and Sexual Activity  Alcohol Use No     Social History   Substance and Sexual Activity  Drug Use Yes  . Types: Marijuana    Social History   Socioeconomic History  . Marital status: Single    Spouse name: Not on file  . Number of children: Not on file  . Years of education: Not on file  . Highest education level: Not on file  Occupational History  . Not on file  Social Needs  . Financial resource strain: Not on file  . Food insecurity    Worry: Not on file    Inability: Not on file  . Transportation needs    Medical: Not on file    Non-medical: Not on file  Tobacco Use  . Smoking status: Current Every Day Smoker    Types: Cigars  . Smokeless tobacco: Never Used  Substance and Sexual Activity  . Alcohol use: No  . Drug use: Yes    Types: Marijuana  . Sexual activity: Yes  Lifestyle  . Physical activity    Days per week: Not on file    Minutes per session: Not on file  . Stress: Not on file  Relationships  . Social  Herbalist on phone: Not on file    Gets together: Not on file    Attends religious service: Not on file    Active member of club or organization: Not on file    Attends meetings of clubs or organizations: Not on file    Relationship status: Not on file  Other Topics Concern  . Not on file  Social History Narrative  . Not on file   Additional Social History:                         Sleep: Good  Appetite:  Good  Current Medications: Current Facility-Administered Medications  Medication Dose Route Frequency Provider Last Rate Last Dose  . acetaminophen (TYLENOL) tablet 650 mg  650 mg Oral Q6H PRN Deloria Lair, NP   650 mg at 10/31/18 1819  . alum & mag hydroxide-simeth (MAALOX/MYLANTA) 200-200-20 MG/5ML suspension 30 mL  30 mL Oral Q4H PRN Dixon, Rashaun M, NP      . benztropine (COGENTIN) tablet 1 mg  1 mg Oral BID Deloria Lair, NP   1 mg at 11/02/18 0749  . divalproex (DEPAKOTE) DR tablet 1,000 mg  1,000 mg Oral QHS Johnn Hai, MD   1,000 mg at 11/02/18 0038  . divalproex (DEPAKOTE) DR tablet  500 mg  500 mg Oral Q1200 Malvin JohnsFarah, Laaibah Wartman, MD   500 mg at 11/01/18 1225  . fluticasone (FLONASE) 50 MCG/ACT nasal spray 2 spray  2 spray Each Nare Daily Antonieta Pertlary, Greg Lawson, MD   2 spray at 11/02/18 0749  . haloperidol (HALDOL) tablet 10 mg  10 mg Oral QHS Malvin JohnsFarah, Wille Aubuchon, MD   10 mg at 11/02/18 0037  . haloperidol (HALDOL) tablet 5 mg  5 mg Oral BID Malvin JohnsFarah, Jadan Rouillard, MD   5 mg at 11/02/18 0749  . LORazepam (ATIVAN) tablet 1 mg  1 mg Oral Q6H PRN Jearld Leschixon, Rashaun M, NP   1 mg at 11/01/18 1408  . magnesium hydroxide (MILK OF MAGNESIA) suspension 30 mL  30 mL Oral Daily PRN Dixon, Rashaun M, NP      . traZODone (DESYREL) tablet 200 mg  200 mg Oral QHS Dixon, Rashaun M, NP   200 mg at 11/02/18 0037    Lab Results: No results found for this or any previous visit (from the past 48 hour(s)).  Blood Alcohol level:  Lab Results  Component Value Date   ETH <10 10/25/2018   ETH  <10 10/10/2018    Metabolic Disorder Labs: Lab Results  Component Value Date   HGBA1C 4.9 10/11/2018   MPG 93.93 10/11/2018   Lab Results  Component Value Date   PROLACTIN 98.3 (H) 10/29/2018   PROLACTIN 52.5 (H) 10/11/2018   Lab Results  Component Value Date   CHOL 93 10/29/2018   TRIG 75 10/29/2018   HDL 24 (L) 10/29/2018   CHOLHDL 3.9 10/29/2018   VLDL 15 10/29/2018   LDLCALC 54 10/29/2018   LDLCALC 67 10/11/2018    Physical Findings: AIMS: Facial and Oral Movements Muscles of Facial Expression: None, normal Lips and Perioral Area: None, normal Jaw: None, normal Tongue: None, normal,Extremity Movements Upper (arms, wrists, hands, fingers): None, normal Lower (legs, knees, ankles, toes): None, normal, Trunk Movements Neck, shoulders, hips: None, normal, Overall Severity Severity of abnormal movements (highest score from questions above): None, normal Incapacitation due to abnormal movements: None, normal Patient's awareness of abnormal movements (rate only patient's report): No Awareness, Dental Status Current problems with teeth and/or dentures?: No Does patient usually wear dentures?: No  CIWA:  CIWA-Ar Total: 1 COWS:  COWS Total Score: 2  Musculoskeletal: Strength & Muscle Tone: within normal limits Gait & Station: normal Patient leans: N/A  Psychiatric Specialty Exam: Physical Exam  ROS  Blood pressure 105/79, pulse (!) 108, temperature 98.3 F (36.8 C), temperature source Oral, resp. rate 20, height 5\' 4"  (1.626 m), weight 108 kg, SpO2 100 %.Body mass index is 40.87 kg/m.  General Appearance: Casual  Eye Contact:  Good  Speech:  Clear and Coherent  Volume:  Normal  Mood:  Euthymic  Affect:  Congruent  Thought Process:  Coherent and Descriptions of Associations: Circumstantial  Orientation:  Full (Time, Place, and Person)  Thought Content:  Logical and Tangential  Suicidal Thoughts:  No  Homicidal Thoughts:  No  Memory:  Immediate;   Good   Judgement:  Good  Insight:  Good  Psychomotor Activity:  Normal  Concentration:  Concentration: Good  Recall:  Good  Fund of Knowledge:  Good  Language:  Good  Akathisia:  NA-neg  Handed:  Right  AIMS (if indicated):     Assets:  Desire for Improvement Financial Resources/Insurance Housing Leisure Time Physical Health Resilience  ADL's:  Intact  Cognition:  WNL  Sleep:  Number of Hours: 4.75  Treatment Plan Summary: Daily contact with patient to assess and evaluate symptoms and progress in treatment and Medication management no change in meds today continue cognitive and reality based therapy probable discharge tomorrow to IOP program  Malvin JohnsFARAH,Zuleika Gallus, MD 11/02/2018, 11:58 AM

## 2018-11-02 NOTE — Plan of Care (Signed)
D: Patient is alert, pleasant, and cooperative. Denies SI, HI, AVH, and verbally contracts for safety. Patient denies physical symptoms/pain.    A: Medications administered per MD order. Support provided. Patient educated on safety on the unit and medications. Routine safety checks every 15 minutes. Patient stated understanding to tell nurse about any new physical symptoms. Patient understands to tell staff of any needs.     R: No adverse drug reactions noted. Patient verbally contracts for safety. Patient remains safe at this time and will continue to monitor.   Problem: Safety: Goal: Periods of time without injury will increase Outcome: Progressing   Patient remains safe and will continue to monitor.   [place] NOVEL CORONAVIRUS (COVID-19) DAILY CHECK-OFF SYMPTOMS - answer yes or no to each - every day NO YES  Have you had a fever in the past 24 hours?  Fever (Temp > 37.80C / 100F) X   Have you had any of these symptoms in the past 24 hours? New Cough  Sore Throat   Shortness of Breath  Difficulty Breathing  Unexplained Body Aches   X   Have you had any one of these symptoms in the past 24 hours not related to allergies?   Runny Nose  Nasal Congestion  Sneezing   X   If you have had runny nose, nasal congestion, sneezing in the past 24 hours, has it worsened?  X   EXPOSURES - check yes or no X   Have you traveled outside the state in the past 14 days?  X   Have you been in contact with someone with a confirmed diagnosis of COVID-19 or PUI in the past 14 days without wearing appropriate PPE?  X   Have you been living in the same home as a person with confirmed diagnosis of COVID-19 or a PUI (household contact)?    X   Have you been diagnosed with COVID-19?    X              What to do next: Answered NO to all: Answered YES to anything:   Proceed with unit schedule Follow the [facility] Inpatient Flowsheet.

## 2018-11-02 NOTE — Care Management (Signed)
Patient has been accepted to [facility]. Assessment is Wednesday, 7/15 at 9:00a.   CMA has notified CSW, [place].       Ayshia Gramlich Care Management Assistant  Email:[name] Office: [phone]

## 2018-11-02 NOTE — Care Management (Signed)
Patient sent referral to [facility]. Patient has been declined due to cannabis use. CMA was recommended to refer patient to [facility] (PHP).   CMA sent referral to [facility] and will follow up.      Katalea Ucci Care Management Assistant  Email:[email or web address] Office: [phone]

## 2018-11-02 NOTE — Progress Notes (Signed)
Patient ID: Samantha Ashley, female   DOB: [birth year], 23 y.o.   MRN: [id number]   CSW contacted pt's parents to update them on the pt's progress and to update them about aftercare. CSW let them know that the pt was denied to the [facility] PHP program but that another referral was put in for [facility] PHP program in [place] with the understanding that it will be online/over the phone.

## 2018-11-03 MED ORDER — DIVALPROEX SODIUM ER 500 MG PO TB24: 1000.0000 mg | ORAL_TABLET | Freq: Every day | ORAL | Status: DC

## 2018-11-03 MED ORDER — HALOPERIDOL 5 MG PO TABS: ORAL_TABLET | ORAL | 2 refills | Status: DC

## 2018-11-03 MED ORDER — DIVALPROEX SODIUM ER 500 MG PO TB24: 500.0000 mg | ORAL_TABLET | Freq: Every day | ORAL | Status: DC

## 2018-11-03 MED ORDER — HALOPERIDOL 5 MG PO TABS
5.0000 mg | ORAL_TABLET | Freq: Every day | ORAL | Status: DC
  Filled 2018-11-03: qty 1

## 2018-11-03 MED ORDER — HALOPERIDOL 5 MG PO TABS: 15.0000 mg | ORAL_TABLET | Freq: Every day | ORAL | Status: DC
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
## 2018-11-03 NOTE — Progress Notes (Signed)
  [facility] Adult Case Management Discharge Plan :  Will you be returning to the same living situation after discharge:  Yes,  with her fiance at her apartment At discharge, do you have transportation home?: Yes,  pt's parents Do you have the ability to pay for your medications: Yes,  private insurance   Release of information consent forms completed and in the chart;  Patient's signature needed at discharge.  Patient to Follow up at: Follow-up Information    [facility] Follow up on 11/09/2018.   Specialty: Behavioral Health Why: Please attend your assessment for partial hospitalization program on Wednesday, 7/15 at 9:00a.  Be sure to bring your photo ID, insurance card, and SSN.  After your assessment appointment, the program will be held virtually over zoom.  Contact information: [place] [address] [phone]           Next level of care provider has access to [place] and Suicide Prevention discussed: Yes,  pt's father  Have you used any form of tobacco in the last 30 days? (Cigarettes, Smokeless Tobacco, Cigars, and/or Pipes): Yes  Has patient been referred to the Quitline?: Patient refused referral  Patient has been referred for addiction treatment: Yes  [name], LCSW 11/03/2018, 10:27 AM
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
## 2018-11-03 NOTE — Progress Notes (Signed)
Patient ID: Samantha Ashley, female   DOB: 1995-07-21, 23 y.o.   MRN: [id number] Patient discharged to home/self care in the presence of her family.  Patient denies is, hi and avh upon discharge.  Patient stated understanding of all discharge instructions and receipt of personal belongings. Patient was bright and eager to discharge.

## 2018-11-03 NOTE — Progress Notes (Signed)
The focus of this group is to help patients establish daily goals to achieve during treatment and discuss how the patient can incorporate goal setting into their daily lives to aide in recovery. 

## 2019-02-23 ENCOUNTER — Ambulatory Visit (HOSPITAL_COMMUNITY)
Admission: RE | Admit: 2019-02-23 | Discharge: 2019-02-23 | Disposition: A | Discharge status: Home / Self Care | Payer: 59 | Attending: Psychiatry | Admitting: Psychiatry

## 2019-02-23 DIAGNOSIS — F322 Major depressive disorder, single episode, severe without psychotic features: Secondary | ICD-10-CM | POA: Insufficient documentation

## 2019-02-23 DIAGNOSIS — R45851 Suicidal ideations: Secondary | ICD-10-CM | POA: Diagnosis not present

## 2019-02-23 NOTE — BH Assessment (Signed)
Assessment Note  Samantha Ashley is an 23 y.o. female Patient presents voluntarily for walk-in assessment. Patient seen outpatient today at [facility], psychiatrist recommends assessment. Patient endorses passive suicidal ideations "for the last three months." Patient denies plan or intent, states "I am not going to kill myself, I am afraid of dying." Patient denies history of suicide attempts. Patient denies homicidal ideations, denies hallucinations. Patient has outpatient therapist, [name], also followed by [facility]. Patient lives with girlfriend, plans to start new job with [employer] on November 15. Patient's girlfriend denies concern for patient safety. Patient and girlfriend deny weapons in home.  Patient endorses use of marijuana daily and alcohol, rarely.    Diagnosis:  F32.2  Major depressive disorder, Single episode, Severe   Past Medical History:  Past Medical History:  Diagnosis Date  . Asthma     No past surgical history on file.  Family History: No family history on file.  Social History:  reports that she has been smoking cigars. She has never used smokeless tobacco. She reports current drug use. Drug: Marijuana. She reports that she does not drink alcohol.  Additional Social History:  Alcohol / Drug Use Pain Medications: see MAR Prescriptions: see MAR Over the Counter: see MAR History of alcohol / drug use?: No history of alcohol / drug abuse(patient denied)  CIWA: CIWA-Ar BP: 110/90 Pulse Rate: (!) 104 COWS:    Allergies:  Allergies  Allergen Reactions  . Coconut Oil Itching    Home Medications: (Not in a hospital admission)   OB/GYN Status:  No LMP recorded.  General Assessment Data Location of Assessment: [facility](walker-in) TTS Assessment: In system Is this a Tele or Face-to-Face Assessment?: Face-to-Face Is this an Initial Assessment or a Re-assessment for this encounter?: Initial Assessment Patient Accompanied by::  Other(partner (girlfriend) ) Language Other than English: No Living Arrangements: Other (Comment)(partner (girlfriend) ) What gender do you identify as?: Female Marital status: Single Maiden name: n/a Pregnancy Status: No Living Arrangements: Spouse/significant other Can pt return to current living arrangement?: No Admission Status: Voluntary Is patient capable of signing voluntary admission?: Yes Referral Source: Self/Family/Friend Insurance type: Medicaid      Crisis Care Plan Living Arrangements: Spouse/significant other Name of Psychiatrist: Transport planner  Name of Therapist: [name] (therapist)   Education Status Is patient currently in school?: No  Risk to self with the past 6 months Suicidal Ideation: No Has patient been a risk to self within the past 6 months prior to admission? : No Suicidal Intent: No Has patient had any suicidal intent within the past 6 months prior to admission? : No Is patient at risk for suicide?: No Suicidal Plan?: No Has patient had any suicidal plan within the past 6 months prior to admission? : No Access to Means: No What has been your use of drugs/alcohol within the last 12 months?: n/a Previous Attempts/Gestures: No How many times?: 0 Other Self Harm Risks: none known Triggers for Past Attempts: None known Intentional Self Injurious Behavior: None Family Suicide History: No Recent stressful life event(s): Other (Comment)(none report ) Persecutory voices/beliefs?: No Depression: Yes Depression Symptoms: (oversleeping ) Substance abuse history and/or treatment for substance abuse?: No Suicide prevention information given to non-admitted patients: Not applicable  Risk to Others within the past 6 months Homicidal Ideation: No Does patient have any lifetime risk of violence toward others beyond the six months prior to admission? : No Thoughts of Harm to Others: No Current Homicidal Intent: No Current Homicidal Plan: No Access  to  Homicidal Means: No Identified Victim: n/a History of harm to others?: No Assessment of Violence: None Noted Violent Behavior Description: None Noted  Does patient have access to weapons?: No Criminal Charges Pending?: No Does patient have a court date: No Is patient on probation?: No  Psychosis Hallucinations: None noted Delusions: None noted  Mental Status Report Appearance/Hygiene: Other (Comment)(dressed appropriate ) Eye Contact: Good Motor Activity: Freedom of movement Speech: Logical/coherent Level of Consciousness: Alert Mood: Preoccupied Affect: Preoccupied Anxiety Level: None Thought Processes: Coherent, Relevant Judgement: Unimpaired Orientation: Person, Place, Time, Situation Obsessive Compulsive Thoughts/Behaviors: None  Cognitive Functioning Concentration: Good Memory: Recent Intact, Remote Intact Is patient IDD: No Insight: Good Impulse Control: Good Appetite: Fair Have you had any weight changes? : No Change Sleep: Increased Total Hours of Sleep: 15 Vegetative Symptoms: None  ADLScreening [facility] Assessment Services) Patient's cognitive ability adequate to safely complete daily activities?: Yes Patient able to express need for assistance with ADLs?: Yes Independently performs ADLs?: No  Prior Inpatient Therapy Prior Inpatient Therapy: No  Prior Outpatient Therapy Prior Outpatient Therapy: Yes Prior Therapy Facilty/Provider(s): [facility]  Reason for Treatment: Mental Health  Does patient have an ACCT team?: No Does patient have Intensive In-House Services?  : No Does patient have [facility] services? : No Does patient have P4CC services?: No  ADL Screening (condition at time of admission) Patient's cognitive ability adequate to safely complete daily activities?: Yes Is the patient deaf or have difficulty hearing?: No Does the patient have difficulty seeing, even when wearing glasses/contacts?: No Does the patient have difficulty concentrating,  remembering, or making decisions?: No Patient able to express need for assistance with ADLs?: Yes Does the patient have difficulty dressing or bathing?: No Independently performs ADLs?: No Does the patient have difficulty walking or climbing stairs?: No       Abuse/Neglect Assessment (Assessment to be complete while patient is alone) Abuse/Neglect Assessment Can Be Completed: Yes Physical Abuse: Denies Verbal Abuse: Denies Sexual Abuse: Denies Exploitation of patient/patient's resources: Denies Self-Neglect: Denies     Regulatory affairs officer (For Healthcare) Does Patient Have a Medical Advance Directive?: No Would patient like information on creating a medical advance directive?: No - Patient declined          Disposition:  Disposition Initial Assessment Completed for this Encounter: Lenard Simmer, NP, pt does not meet criteria for inpt) Disposition of Patient: Discharge(Tina Hall Busing, NP, pt does not meet inpt criteria )  On Site Evaluation by:   Reviewed with Physician:    Despina Hidden 02/23/2019 7:05 PM

## 2019-02-23 NOTE — H&P (Signed)
Behavioral Health Medical Screening Exam  Samantha Ashley is an 23 y.o. female. Patient presents voluntarily for walk-in assessment. Patient seen outpatient today at [facility], psychiatrist recommends assessment. Patient endorses passive suicidal ideations "for the last three months." Patient denies plan or intent, states "I am not going to kill myself, I am afraid of dying." Patient denies history of suicide attempts. Patient denies homicidal ideations, denies hallucinations. Patient has outpatient therapist, Renne Crigler, also followed by [facility]. Patient lives with girlfriend, plans to start new job with [employer] on November 15. Patient's girlfriend denies concern for patient safety. Patient and girlfriend deny weapons in home.  Patient endorses use of marijuana daily and alcohol, rarely.    Total Time spent with patient: 30 minutes  Psychiatric Specialty Exam: Physical Exam  Nursing note and vitals reviewed. Constitutional: She is oriented to person, place, and time. She appears well-developed.  HENT:  Head: Normocephalic.  Cardiovascular: Normal rate.  Respiratory: Effort normal.  Neurological: She is alert and oriented to person, place, and time.  Psychiatric: She has a normal mood and affect. Her behavior is normal. Judgment and thought content normal.    Review of Systems  Constitutional: Negative.   HENT: Negative.   Eyes: Negative.   Respiratory: Negative.   Cardiovascular: Negative.   Gastrointestinal: Negative.   Genitourinary: Negative.   Musculoskeletal: Negative.   Skin: Negative.   Neurological: Negative.   Endo/Heme/Allergies: Negative.   Psychiatric/Behavioral: Negative.     Blood pressure 110/90, pulse (!) 104, temperature 98.1 F (36.7 C), temperature source Oral, resp. rate 18, SpO2 100 %.There is no height or weight on file to calculate BMI.  General Appearance: Casual  Eye Contact:  Good  Speech:  Clear and Coherent and Normal Rate  Volume:  Normal   Mood:  Euthymic  Affect:  Appropriate and Congruent  Thought Process:  Coherent, Goal Directed and Descriptions of Associations: Intact  Orientation:  Full (Time, Place, and Person)  Thought Content:  WDL and Logical  Suicidal Thoughts:  No  Homicidal Thoughts:  No  Memory:  Immediate;   Good Recent;   Good Remote;   Good  Judgement:  Good  Insight:  Good  Psychomotor Activity:  Normal  Concentration: Concentration: Good and Attention Span: Good  Recall:  Good  Fund of Knowledge:Good  Language: Good  Akathisia:  No  Handed:  Right  AIMS (if indicated):     Assets:  Communication Skills Desire for Improvement Financial Resources/Insurance Housing Social Support  Sleep:       Musculoskeletal: Strength & Muscle Tone: within normal limits Gait & Station: normal Patient leans: N/A  Blood pressure 110/90, pulse (!) 104, temperature 98.1 F (36.7 C), temperature source Oral, resp. rate 18, SpO2 100 %.  Recommendations:  Based on my evaluation the patient does not appear to have an emergency medical condition.  Recommend discharge, follow-up with outpatient.  Emmaline Kluver, FNP 02/23/2019, 6:48 PM

## 2019-03-12 ENCOUNTER — Emergency Department (HOSPITAL_COMMUNITY)
Admission: EM | Admit: 2019-03-12 | Discharge: 2019-03-12 | Disposition: A | Discharge status: Home / Self Care | Payer: 59 | Attending: Emergency Medicine | Admitting: Emergency Medicine

## 2019-03-12 ENCOUNTER — Other Ambulatory Visit: Payer: Self-pay

## 2019-03-12 DIAGNOSIS — Z79899 Other long term (current) drug therapy: Secondary | ICD-10-CM | POA: Diagnosis not present

## 2019-03-12 DIAGNOSIS — R109 Unspecified abdominal pain: Secondary | ICD-10-CM | POA: Diagnosis present

## 2019-03-12 DIAGNOSIS — Z20828 Contact with and (suspected) exposure to other viral communicable diseases: Secondary | ICD-10-CM | POA: Diagnosis not present

## 2019-03-12 DIAGNOSIS — R112 Nausea with vomiting, unspecified: Secondary | ICD-10-CM | POA: Diagnosis not present

## 2019-03-12 DIAGNOSIS — Z20822 Contact with and (suspected) exposure to covid-19: Secondary | ICD-10-CM

## 2019-03-12 DIAGNOSIS — J45909 Unspecified asthma, uncomplicated: Secondary | ICD-10-CM | POA: Diagnosis not present

## 2019-03-12 DIAGNOSIS — F17290 Nicotine dependence, other tobacco product, uncomplicated: Secondary | ICD-10-CM | POA: Diagnosis not present

## 2019-03-12 MED ORDER — ONDANSETRON HCL 4 MG PO TABS: 4.0000 mg | ORAL_TABLET | Freq: Four times a day (QID) | ORAL | 0 refills | Status: DC

## 2019-03-12 NOTE — ED Notes (Signed)
Patient verbalizes understanding of discharge instructions. Opportunity for questioning and answers were provided. Armband removed by staff, pt discharged from ED.  

## 2019-03-12 NOTE — Discharge Instructions (Signed)
You were seen today for suspected COVID-19.  Your vital signs are normal and they are reassuring.  Unfortunately there is no specific treatment for COVID-19.  However, since you are young and otherwise healthy you should be feeling better after 10 days.  If you do have worsening symptoms or you feel like you were having difficulty breathing especially with walking short distances then you should be reevaluated.  Continue to take your albuterol inhaler if you need it.  I have sent a medication to the pharmacy for nausea that he can use if you need it.

## 2019-03-12 NOTE — ED Triage Notes (Signed)
Pt reports n/v/d and abdominal pain since Thursday.  Also reports body aches.  Denies any fever.  Pt was tested for covid on Friday and awaiting results

## 2019-03-12 NOTE — ED Provider Notes (Signed)
[facility] EMERGENCY DEPARTMENT Provider Note   CSN: [id number] Arrival date & time: 03/12/19  1100     History   Chief Complaint Chief Complaint  Patient presents with   Abdominal Pain   Emesis   Nausea    HPI Samantha Ashley is a 23 y.o. female.     Patient is a 23 year old female with past medical history of asthma, schizoaffective disorder, bipolar disorder presenting to the emergency department for COVID-19 symptoms.  Reports that she lives with her girlfriend and both her and her girlfriend began to have symptoms last Thursday.  She reports that she has had some nausea with some nonbloody vomiting as well as nonbloody diarrhea.  Reports that her girlfriend began to have cough, shortness of breath and loss of taste and smell.  They denied any known contacts.  Denies any fever, chills, abdominal pain, dysuria, shortness of breath.  She reports that she had a Covid test on Friday but has not had the results yet.  She reports that she came in today because she felt like her symptoms were worsening.  She has not had to use her inhaler at home.     Past Medical History:  Diagnosis Date   Asthma     Patient Active Problem List   Diagnosis Date Noted   Bipolar 1 disorder, depressed ([place]) 10/28/2018   Bipolar depression ([place]) 10/15/2018   Schizoaffective disorder, bipolar type ([place]) 10/10/2018   Schizophrenia ([place]) 10/10/2018    No past surgical history on file.   OB History   No obstetric history on file.      Home Medications    Prior to Admission medications   Medication Sig Start Date End Date Taking? Authorizing Provider  benztropine (COGENTIN) 1 MG tablet Take 1 tablet (1 mg total) by mouth 2 (two) times daily. 10/21/18   Johnn Hai, MD  divalproex (DEPAKOTE ER) 500 MG 24 hr tablet 1 in am 2 at h s Patient taking differently: Take 500 mg by mouth 2 (two) times a day. 1 in the morning and 2 at bedtime 10/13/18   Johnn Hai, MD    haloperidol (HALDOL) 5 MG tablet 1 in am 3 at hs 11/03/18   Johnn Hai, MD  omega-3 acid ethyl esters (LOVAZA) 1 g capsule Take 1 capsule (1 g total) by mouth 2 (two) times daily. 10/13/18   Johnn Hai, MD  ondansetron (ZOFRAN) 4 MG tablet Take 1 tablet (4 mg total) by mouth every 6 (six) hours. 03/12/19   Alveria Apley, PA-C  Prenatal Vit-Fe Fumarate-FA (PRENATAL MULTIVITAMIN) TABS tablet Take 1 tablet by mouth daily. Patient not taking: Reported on 10/16/2018 10/14/18   Johnn Hai, MD  propranolol (INDERAL) 40 MG tablet Take 1.5 tablets (60 mg total) by mouth 2 (two) times daily. 10/13/18   Johnn Hai, MD  risperiDONE microspheres (RISPERDAL CONSTA) 50 MG injection Inject 2 mLs (50 mg total) into the muscle every 14 (fourteen) days. Due 7/16 10/21/18   Johnn Hai, MD  traZODone (DESYREL) 100 MG tablet Take 2 tablets (200 mg total) by mouth at bedtime. 10/21/18   Johnn Hai, MD    Family History No family history on file.  Social History Social History   Tobacco Use   Smoking status: Current Every Day Smoker    Types: Cigars   Smokeless tobacco: Never Used  Substance Use Topics   Alcohol use: No   Drug use: Yes    Types: Marijuana  Allergies   Coconut oil   Review of Systems Review of Systems  Constitutional: Positive for chills. Negative for activity change, appetite change, fatigue and fever.  HENT: Negative for congestion, rhinorrhea and sore throat.   Respiratory: Negative for cough, shortness of breath and wheezing.   Cardiovascular: Negative for chest pain.  Gastrointestinal: Positive for diarrhea, nausea and vomiting. Negative for abdominal distention, abdominal pain, anal bleeding, blood in stool, constipation and rectal pain.  Genitourinary: Negative for dysuria.  Musculoskeletal: Negative for back pain.  Skin: Negative for rash.  Neurological: Negative for dizziness, light-headedness and headaches.     Physical Exam Updated Vital Signs BP  113/74 (BP Location: Right Arm)    Pulse 69    Temp 98.1 F (36.7 C) (Oral)    Resp 16    SpO2 100%   Physical Exam Constitutional:      General: She is not in acute distress.    Appearance: She is well-developed and normal weight. She is not ill-appearing, toxic-appearing or diaphoretic.  HENT:     Head: Normocephalic.     Mouth/Throat:     Mouth: Mucous membranes are moist.  Cardiovascular:     Rate and Rhythm: Normal rate and regular rhythm.  Pulmonary:     Effort: Pulmonary effort is normal.     Breath sounds: Normal breath sounds. No wheezing.  Abdominal:     General: Abdomen is flat. Bowel sounds are normal.     Palpations: Abdomen is soft.     Tenderness: There is no abdominal tenderness.  Neurological:     Mental Status: She is alert.  Psychiatric:        Mood and Affect: Mood normal.      ED Treatments / Results  Labs (all labs ordered are listed, but only abnormal results are displayed) Labs Reviewed - No data to display  EKG None  Radiology No results found.  Procedures Procedures (including critical care time)  Medications Ordered in ED Medications - No data to display   Initial Impression / Assessment and Plan / ED Course  I have reviewed the triage vital signs and the nursing notes.  Pertinent labs & imaging results that were available during my care of the patient were reviewed by me and considered in my medical decision making (see chart for details).  Clinical Course as of Mar 11 1132  Wynelle LinkSun Mar 12, 2019  1126 Patient is being evaluated for covid 19 symptoms today.  She reports her and her girlfriend at home have had symptoms and had a test this past Friday but do not know the results.  The patient reports that her girlfriend came to the hospital to be evaluated because her shortness of breath is worsening.  The patient also wanted to be evaluated to see if there are any "antibodies" she could take for her infection.  The patient appears very well  with a normal physical exam and unremarkable vital signs.  She is not hypoxic and does not complain of shortness of breath.  She does not meet admission criteria at this time.  I am going to send her home with a prescription for Zofran as she had a couple episodes of nausea and vomiting.  She was advised on strict return precautions.  She was also advised on home quarantine restrictions and when she is allowed to return to work.   [KM]    Clinical Course User Index [KM] Arlyn DunningMcLean, Lynisha Osuch A, PA-C       Based on  review of vitals, medical screening exam, lab work and/or imaging, there does not appear to be an acute, emergent etiology for the patient's symptoms. Counseled pt on good return precautions and encouraged both PCP and ED follow-up as needed.  Prior to discharge, I also discussed incidental imaging findings with patient in detail and advised appropriate, recommended follow-up in detail.  Clinical Impression: 1. Suspected COVID-19 virus infection     Disposition: Discharge  Prior to providing a prescription for a controlled substance, I independently reviewed the patient's recent prescription history on the [place] Controlled Substance Reporting System. The patient had no recent or regular prescriptions and was deemed appropriate for a brief, less than 3 day prescription of narcotic for acute analgesia.  This note was prepared with assistance of Conservation officer, historic buildings. Occasional wrong-word or sound-a-like substitutions may have occurred due to the inherent limitations of voice recognition software.   Final Clinical Impressions(s) / ED Diagnoses   Final diagnoses:  Suspected COVID-19 virus infection    ED Discharge Orders         Ordered    ondansetron (ZOFRAN) 4 MG tablet  Every 6 hours     03/12/19 1122           Jeral Pinch 03/12/19 1134    Rolan Bucco, MD 03/12/19 1157

## 2019-04-01 ENCOUNTER — Ambulatory Visit (HOSPITAL_COMMUNITY)
Admission: EM | Admit: 2019-04-01 | Discharge: 2019-04-01 | Disposition: A | Discharge status: Home / Self Care | Payer: 59 | Attending: Family Medicine | Admitting: Family Medicine

## 2019-04-01 ENCOUNTER — Encounter (HOSPITAL_COMMUNITY): Payer: Self-pay

## 2019-04-01 ENCOUNTER — Other Ambulatory Visit: Payer: Self-pay

## 2019-04-01 DIAGNOSIS — H1032 Unspecified acute conjunctivitis, left eye: Secondary | ICD-10-CM

## 2019-04-01 MED ORDER — ERYTHROMYCIN 5 MG/GM OP OINT: 1.0000 "application " | TOPICAL_OINTMENT | Freq: Three times a day (TID) | OPHTHALMIC | 0 refills | Status: AC

## 2019-04-01 NOTE — ED Provider Notes (Signed)
[facility]    CSN: [id number] Arrival date & time: 04/01/19  1028      History   Chief Complaint Chief Complaint  Patient presents with  . Conjunctivitis    HPI Samantha Ashley is a 23 y.o. female.   She is presenting with a left red eye.  This started this morning.  She did take her contacts out last night.  She denies any penetrating trauma or debris in her eye.  She has had a history of pinkeye and this feels similar to that.  She is having some photophobia.  Is wearing her glasses currently.  HPI  Past Medical History:  Diagnosis Date  . Asthma     Patient Active Problem List   Diagnosis Date Noted  . Bipolar 1 disorder, depressed ([place]) 10/28/2018  . Bipolar depression ([place]) 10/15/2018  . Schizoaffective disorder, bipolar type ([place]) 10/10/2018  . Schizophrenia ([place]) 10/10/2018    History reviewed. No pertinent surgical history.  OB History   No obstetric history on file.      Home Medications    Prior to Admission medications   Medication Sig Start Date End Date Taking? Authorizing Provider  benztropine (COGENTIN) 1 MG tablet Take 1 tablet (1 mg total) by mouth 2 (two) times daily. 10/21/18   Johnn Hai, MD  divalproex (DEPAKOTE ER) 500 MG 24 hr tablet 1 in am 2 at h s Patient taking differently: Take 500 mg by mouth 2 (two) times a day. 1 in the morning and 2 at bedtime 10/13/18   Johnn Hai, MD  erythromycin ophthalmic ointment Place 1 application into the left eye 3 (three) times daily for 5 days. Apply 1 inch ribbon to affected eye TID for 5 days. 04/01/19 04/06/19  Rosemarie Ax, MD  haloperidol (HALDOL) 5 MG tablet 1 in am 3 at hs 11/03/18   Johnn Hai, MD  omega-3 acid ethyl esters (LOVAZA) 1 g capsule Take 1 capsule (1 g total) by mouth 2 (two) times daily. 10/13/18   Johnn Hai, MD  ondansetron (ZOFRAN) 4 MG tablet Take 1 tablet (4 mg total) by mouth every 6 (six) hours. 03/12/19   Alveria Apley, PA-C  Prenatal Vit-Fe Fumarate-FA  (PRENATAL MULTIVITAMIN) TABS tablet Take 1 tablet by mouth daily. Patient not taking: Reported on 10/16/2018 10/14/18   Johnn Hai, MD  propranolol (INDERAL) 40 MG tablet Take 1.5 tablets (60 mg total) by mouth 2 (two) times daily. 10/13/18   Johnn Hai, MD  risperiDONE microspheres (RISPERDAL CONSTA) 50 MG injection Inject 2 mLs (50 mg total) into the muscle every 14 (fourteen) days. Due 7/16 10/21/18   Johnn Hai, MD  traZODone (DESYREL) 100 MG tablet Take 2 tablets (200 mg total) by mouth at bedtime. 10/21/18   Johnn Hai, MD    Family History Family History  Problem Relation Age of Onset  . Healthy Mother   . Healthy Father     Social History Social History   Tobacco Use  . Smoking status: Current Every Day Smoker    Types: Cigars  . Smokeless tobacco: Never Used  Substance Use Topics  . Alcohol use: No  . Drug use: Yes    Types: Marijuana     Allergies   Coconut oil   Review of Systems Review of Systems  Constitutional: Negative for fever.  HENT: Negative for congestion.   Eyes: Positive for photophobia and redness.  Respiratory: Negative for cough.   Cardiovascular: Negative for chest pain.  Gastrointestinal: Negative  for abdominal pain.  Musculoskeletal: Negative for back pain.  Skin: Negative for color change.  Neurological: Negative for weakness.  Hematological: Negative for adenopathy.     Physical Exam Triage Vital Signs ED Triage Vitals  Enc Vitals Group     BP 04/01/19 1120 (!) 131/95     Pulse Rate 04/01/19 1120 85     Resp 04/01/19 1120 18     Temp 04/01/19 1120 98.2 F (36.8 C)     Temp Source 04/01/19 1120 Oral     SpO2 04/01/19 1120 100 %     Weight 04/01/19 1118 249 lb (112.9 kg)     Height --      Head Circumference --      Peak Flow --      Pain Score 04/01/19 1120 7     Pain Loc --      Pain Edu? --      Excl. in GC? --    No data found.  Updated Vital Signs BP (!) 131/95 (BP Location: Right Arm)   Pulse 85   Temp 98.2  F (36.8 C) (Oral)   Resp 18   Wt 112.9 kg   LMP 03/28/2019   SpO2 100%   BMI 41.44 kg/m   Visual Acuity Right Eye Distance:   Left Eye Distance:   Bilateral Distance:    Right Eye Near:   Left Eye Near:    Bilateral Near:     Physical Exam Gen: NAD, alert, cooperative with exam, well-appearing ENT: normal lips, normal nasal mucosa,  Eye: normal EOM, normal conjunctive and lid on right, injected conjunctive on left CV:  no edema, +2 pedal pulses   Resp: no accessory muscle use, non-labored,  Skin: no rashes, no areas of induration  Neuro: normal tone, normal sensation to touch Psych:  normal insight, alert and oriented MSK: normal gait, normal strength      UC Treatments / Results  Labs (all labs ordered are listed, but only abnormal results are displayed) Labs Reviewed - No data to display  EKG   Radiology No results found.  Procedures Procedures (including critical care time)  Medications Ordered in UC Medications - No data to display  Initial Impression / Assessment and Plan / UC Course  I have reviewed the triage vital signs and the nursing notes.  Pertinent labs & imaging results that were available during my care of the patient were reviewed by me and considered in my medical decision making (see chart for details).     [name] is a 23 year old female that is presenting with conjunctivitis.  Likely viral in nature.  Denies any recent exposure anyone with similar symptoms.  Having some photophobia.  Advised to stop wearing her contacts until her symptoms are improved.  Provided with erythromycin ointment.  Given indications to follow-up.  Counseled supportive care.  Final Clinical Impressions(s) / UC Diagnoses   Final diagnoses:  Acute conjunctivitis of left eye, unspecified acute conjunctivitis type     Discharge Instructions     Please practice good hand hygiene  Please avoid wearing contacts  You can try the antibiotic drops if no  improvement  Please follow up if your symptoms fail to improve.     ED Prescriptions    Medication Sig Dispense Auth. Provider   erythromycin ophthalmic ointment Place 1 application into the left eye 3 (three) times daily for 5 days. Apply 1 inch ribbon to affected eye TID for 5 days. 3.5 g Myra Rude,  MD     PDMP not reviewed this encounter.   Myra RudeSchmitz,  E, MD 04/01/19 1230

## 2019-04-01 NOTE — Discharge Instructions (Signed)
Please practice good hand hygiene  Please avoid wearing contacts  You can try the antibiotic drops if no improvement  Please follow up if your symptoms fail to improve.

## 2019-04-01 NOTE — ED Triage Notes (Signed)
Pt states her left eye has been draining and swollen. Pt states the light hurts her eye. This started this morning.

## 2019-05-03 ENCOUNTER — Other Ambulatory Visit: Payer: Self-pay

## 2019-05-03 ENCOUNTER — Encounter (HOSPITAL_COMMUNITY): Payer: Self-pay | Admitting: Emergency Medicine

## 2019-05-03 ENCOUNTER — Ambulatory Visit (HOSPITAL_COMMUNITY)
Admission: EM | Admit: 2019-05-03 | Discharge: 2019-05-03 | Disposition: A | Discharge status: Home / Self Care | Payer: 59 | Attending: Family Medicine | Admitting: Family Medicine

## 2019-05-03 DIAGNOSIS — Z3202 Encounter for pregnancy test, result negative: Secondary | ICD-10-CM

## 2019-05-03 DIAGNOSIS — Z91018 Allergy to other foods: Secondary | ICD-10-CM | POA: Diagnosis not present

## 2019-05-03 DIAGNOSIS — Z20822 Contact with and (suspected) exposure to covid-19: Secondary | ICD-10-CM | POA: Diagnosis not present

## 2019-05-03 DIAGNOSIS — J45909 Unspecified asthma, uncomplicated: Secondary | ICD-10-CM | POA: Insufficient documentation

## 2019-05-03 DIAGNOSIS — Z7984 Long term (current) use of oral hypoglycemic drugs: Secondary | ICD-10-CM | POA: Insufficient documentation

## 2019-05-03 DIAGNOSIS — F250 Schizoaffective disorder, bipolar type: Secondary | ICD-10-CM | POA: Insufficient documentation

## 2019-05-03 DIAGNOSIS — F17290 Nicotine dependence, other tobacco product, uncomplicated: Secondary | ICD-10-CM | POA: Diagnosis not present

## 2019-05-03 DIAGNOSIS — F3130 Bipolar disorder, current episode depressed, mild or moderate severity, unspecified: Secondary | ICD-10-CM | POA: Insufficient documentation

## 2019-05-03 DIAGNOSIS — Z79899 Other long term (current) drug therapy: Secondary | ICD-10-CM | POA: Insufficient documentation

## 2019-05-03 DIAGNOSIS — K529 Noninfective gastroenteritis and colitis, unspecified: Secondary | ICD-10-CM | POA: Diagnosis not present

## 2019-05-03 LAB — POC URINE PREG, ED: Preg Test, Ur: NEGATIVE

## 2019-05-03 LAB — POCT PREGNANCY, URINE: Preg Test, Ur: NEGATIVE

## 2019-05-03 MED ORDER — ONDANSETRON HCL 4 MG PO TABS: 4.0000 mg | ORAL_TABLET | Freq: Four times a day (QID) | ORAL | 0 refills | Status: DC

## 2019-05-03 NOTE — ED Provider Notes (Signed)
[name]    CSN: [id number] Arrival date & time: 05/03/19  1102      History   Chief Complaint Chief Complaint  Patient presents with  . Diarrhea  . Nausea  . Vomiting    HPI Samantha Ashley is a 24 y.o. female.   Samantha Ashley presents with complaints of symptoms which started 1 hour after eating hibachi yesterday. Vomited x3 yesterday. 1 episode of diarrhea this morning. Non bloody. No fevers. No body aches. No vomiting today, still with nausea. Has been drinking fluids, hasn't eaten at all. Stomach feels uneasy but without pain. No other URI. No known ill contacts. Works at a [employer]. Has had similar with food poisoning. Has taken bismuth which hasn't helped. Normal urination. Without contributing medical history.     ROS per HPI, negative if not otherwise mentioned.      Past Medical History:  Diagnosis Date  . Asthma     Patient Active Problem List   Diagnosis Date Noted  . Bipolar 1 disorder, depressed ([place]) 10/28/2018  . Bipolar depression ([place]) 10/15/2018  . Schizoaffective disorder, bipolar type ([place]) 10/10/2018  . Schizophrenia ([place]) 10/10/2018    History reviewed. No pertinent surgical history.  OB History   No obstetric history on file.      Home Medications    Prior to Admission medications   Medication Sig Start Date End Date Taking? Authorizing Provider  ARIPiprazole (ABILIFY) 5 MG tablet Take 5 mg by mouth daily.   Yes [provider]  lamoTRIgine (LAMICTAL) 150 MG tablet Take 150 mg by mouth daily.   Yes [provider]  metFORMIN (GLUCOPHAGE) 500 MG tablet Take by mouth 2 (two) times daily with a meal.   Yes [provider]  UNKNOWN TO PATIENT Mood stabilizer   Yes [provider]  benztropine (COGENTIN) 1 MG tablet Take 1 tablet (1 mg total) by mouth 2 (two) times daily. 10/21/18   Johnn Hai, MD  divalproex (DEPAKOTE ER) 500 MG 24 hr tablet 1 in am 2 at h s Patient taking  differently: Take 500 mg by mouth 2 (two) times a day. 1 in the morning and 2 at bedtime 10/13/18   Johnn Hai, MD  haloperidol (HALDOL) 5 MG tablet 1 in am 3 at hs 11/03/18   Johnn Hai, MD  omega-3 acid ethyl esters (LOVAZA) 1 g capsule Take 1 capsule (1 g total) by mouth 2 (two) times daily. 10/13/18   Johnn Hai, MD  ondansetron (ZOFRAN) 4 MG tablet Take 1 tablet (4 mg total) by mouth every 6 (six) hours. 05/03/19   Zigmund Gottron, NP  Prenatal Vit-Fe Fumarate-FA (PRENATAL MULTIVITAMIN) TABS tablet Take 1 tablet by mouth daily. Patient not taking: Reported on 10/16/2018 10/14/18   Johnn Hai, MD  propranolol (INDERAL) 40 MG tablet Take 1.5 tablets (60 mg total) by mouth 2 (two) times daily. 10/13/18   Johnn Hai, MD  risperiDONE microspheres (RISPERDAL CONSTA) 50 MG injection Inject 2 mLs (50 mg total) into the muscle every 14 (fourteen) days. Due 7/16 10/21/18   Johnn Hai, MD  traZODone (DESYREL) 100 MG tablet Take 2 tablets (200 mg total) by mouth at bedtime. 10/21/18   Johnn Hai, MD    Family History Family History  Problem Relation Age of Onset  . Healthy Mother   . Healthy Father     Social History Social History   Tobacco Use  . Smoking status: Current Every Day Smoker    Types:  Cigars  . Smokeless tobacco: Never Used  Substance Use Topics  . Alcohol use: No  . Drug use: Yes    Types: Marijuana     Allergies   Coconut oil   Review of Systems Review of Systems   Physical Exam Triage Vital Signs ED Triage Vitals  Enc Vitals Group     BP 05/03/19 1237 133/88     Pulse Rate 05/03/19 1237 (!) 59     Resp 05/03/19 1237 16     Temp 05/03/19 1237 99 F (37.2 C)     Temp Source 05/03/19 1237 Oral     SpO2 05/03/19 1237 98 %     Weight --      Height --      Head Circumference --      Peak Flow --      Pain Score 05/03/19 1305 2     Pain Loc --      Pain Edu? --      Excl. in GC? --    No data found.  Updated Vital Signs BP 133/88 (BP Location:  Left Arm)   Pulse (!) 59   Temp 99 F (37.2 C) (Oral)   Resp 16   LMP 04/24/2019   SpO2 98%   Visual Acuity Right Eye Distance:   Left Eye Distance:   Bilateral Distance:    Right Eye Near:   Left Eye Near:    Bilateral Near:     Physical Exam Constitutional:      General: She is not in acute distress.    Appearance: She is well-developed.  Cardiovascular:     Rate and Rhythm: Normal rate.  Pulmonary:     Effort: Pulmonary effort is normal.  Abdominal:     Tenderness: There is no abdominal tenderness.  Skin:    General: Skin is warm and dry.  Neurological:     Mental Status: She is alert and oriented to person, place, and time.      UC Treatments / Results  Labs (all labs ordered are listed, but only abnormal results are displayed) Labs Reviewed  POC URINE PREG, ED  POCT PREGNANCY, URINE    EKG   Radiology No results found.  Procedures Procedures (including critical care time)  Medications Ordered in UC Medications - No data to display  Initial Impression / Assessment and Plan / UC Course  I have reviewed the triage vital signs and the nursing notes.  Pertinent labs & imaging results that were available during my care of the patient were reviewed by me and considered in my medical decision making (see chart for details).     Non toxic. Benign physical exam.  No acute abdominal findings on exam. Symptoms likely related to intake yesterday, does work at a physical call center, however, therefore covid-19 testing collected and pending. Isolation encouraged until it results. Supportive cares recommended. Return precautions provided. Patient verbalized understanding and agreeable to plan.   Final Clinical Impressions(s) / UC Diagnoses   Final diagnoses:  Gastroenteritis     Discharge Instructions     Self isolate until covid results are back and negative.  Will notify you by phone of any positive findings. Your negative results will be sent through  your MyChart.     Small frequent sips of fluids- Pedialyte, Gatorade, water, broth- to maintain hydration.   Zofran every 8 hours as needed for nausea or vomiting.   Bland diet as tolerated.  Please return or go to the ER  for any worsenign of symptoms- dehydration, abdominal pain, high fevers, or otherwise concerned.     ED Prescriptions    Medication Sig Dispense Auth. Provider   ondansetron (ZOFRAN) 4 MG tablet Take 1 tablet (4 mg total) by mouth every 6 (six) hours. 12 tablet Georgetta Haber, NP     PDMP not reviewed this encounter.   Georgetta Haber, NP 05/03/19 1333

## 2019-05-03 NOTE — ED Triage Notes (Signed)
PT reports nausea, vomiting, diarrhea that started 1-2 hours after eating hibachi.   PT suspects food poisoning. Denies cough, loss of taste or smell.   3 episodes vomiting in last 24 hours. 1-2 episodes of diarrhea in 24 hours.

## 2019-05-03 NOTE — Discharge Instructions (Signed)
Self isolate until covid results are back and negative.  Will notify you by phone of any positive findings. Your negative results will be sent through your MyChart.     Small frequent sips of fluids- Pedialyte, Gatorade, water, broth- to maintain hydration.   Zofran every 8 hours as needed for nausea or vomiting.   Bland diet as tolerated.  Please return or go to the ER for any worsenign of symptoms- dehydration, abdominal pain, high fevers, or otherwise concerned.

## 2019-05-05 LAB — NOVEL CORONAVIRUS, NAA (HOSP ORDER, SEND-OUT TO REF LAB; TAT 18-24 HRS): SARS-CoV-2, NAA: NOT DETECTED
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
## 2020-08-18 ENCOUNTER — Ambulatory Visit (HOSPITAL_COMMUNITY)
Admission: RE | Admit: 2020-08-18 | Discharge: 2020-08-18 | Disposition: A | Discharge status: Home / Self Care | Payer: 59 | Attending: Psychiatry | Admitting: Psychiatry

## 2020-08-18 ENCOUNTER — Emergency Department (EMERGENCY_DEPARTMENT_HOSPITAL)
Admission: EM | Admit: 2020-08-18 | Discharge: 2020-08-19 | Discharge status: Against Medical Advice | Payer: 59 | Source: Home / Self Care | Attending: Emergency Medicine | Admitting: Emergency Medicine

## 2020-08-18 ENCOUNTER — Other Ambulatory Visit: Payer: Self-pay

## 2020-08-18 DIAGNOSIS — F17290 Nicotine dependence, other tobacco product, uncomplicated: Secondary | ICD-10-CM | POA: Insufficient documentation

## 2020-08-18 DIAGNOSIS — F250 Schizoaffective disorder, bipolar type: Secondary | ICD-10-CM | POA: Diagnosis present

## 2020-08-18 DIAGNOSIS — J45909 Unspecified asthma, uncomplicated: Secondary | ICD-10-CM | POA: Insufficient documentation

## 2020-08-18 DIAGNOSIS — F1290 Cannabis use, unspecified, uncomplicated: Secondary | ICD-10-CM | POA: Insufficient documentation

## 2020-08-18 DIAGNOSIS — R451 Restlessness and agitation: Secondary | ICD-10-CM | POA: Diagnosis not present

## 2020-08-18 DIAGNOSIS — R000 Tachycardia, unspecified: Secondary | ICD-10-CM | POA: Insufficient documentation

## 2020-08-18 DIAGNOSIS — F29 Unspecified psychosis not due to a substance or known physiological condition: Secondary | ICD-10-CM

## 2020-08-18 DIAGNOSIS — Z20822 Contact with and (suspected) exposure to covid-19: Secondary | ICD-10-CM | POA: Insufficient documentation

## 2020-08-18 LAB — CBC WITH DIFFERENTIAL/PLATELET
Abs Immature Granulocytes: 0.04 10*3/uL (ref 0.00–0.07)
Basophils Absolute: 0 10*3/uL (ref 0.0–0.1)
Basophils Relative: 0 %
Eosinophils Absolute: 0 10*3/uL (ref 0.0–0.5)
Eosinophils Relative: 0 %
HCT: 37.2 % (ref 36.0–46.0)
Hemoglobin: 12.1 g/dL (ref 12.0–15.0)
Immature Granulocytes: 0 %
Lymphocytes Relative: 19 %
Lymphs Abs: 2.4 10*3/uL (ref 0.7–4.0)
MCH: 26.4 pg (ref 26.0–34.0)
MCHC: 32.5 g/dL (ref 30.0–36.0)
MCV: 81 fL (ref 80.0–100.0)
Monocytes Absolute: 0.8 10*3/uL (ref 0.1–1.0)
Monocytes Relative: 6 %
Neutro Abs: 9.2 10*3/uL — ABNORMAL HIGH (ref 1.7–7.7)
Neutrophils Relative %: 75 %
Platelets: 270 10*3/uL (ref 150–400)
RBC: 4.59 MIL/uL (ref 3.87–5.11)
RDW: 16.1 % — ABNORMAL HIGH (ref 11.5–15.5)
WBC: 12.5 10*3/uL — ABNORMAL HIGH (ref 4.0–10.5)
nRBC: 0 % (ref 0.0–0.2)

## 2020-08-18 LAB — COMPREHENSIVE METABOLIC PANEL
ALT: 27 U/L (ref 0–44)
AST: 35 U/L (ref 15–41)
Albumin: 4.3 g/dL (ref 3.5–5.0)
Alkaline Phosphatase: 56 U/L (ref 38–126)
Anion gap: 18 — ABNORMAL HIGH (ref 5–15)
BUN: 7 mg/dL (ref 6–20)
CO2: 15 mmol/L — ABNORMAL LOW (ref 22–32)
Calcium: 9.5 mg/dL (ref 8.9–10.3)
Chloride: 100 mmol/L (ref 98–111)
Creatinine, Ser: 1.22 mg/dL — ABNORMAL HIGH (ref 0.44–1.00)
GFR, Estimated: >60 mL/min (ref >60)
Glucose, Bld: 92 mg/dL (ref 70–99)
Potassium: 3.1 mmol/L — ABNORMAL LOW (ref 3.5–5.1)
Sodium: 133 mmol/L — ABNORMAL LOW (ref 135–145)
Total Bilirubin: 0.6 mg/dL (ref 0.3–1.2)
Total Protein: 8.6 g/dL — ABNORMAL HIGH (ref 6.5–8.1)

## 2020-08-18 LAB — I-STAT BETA HCG BLOOD, ED (MC, WL, AP ONLY): I-stat hCG, quantitative: <5 m[IU]/mL (ref <5)

## 2020-08-18 LAB — RESP PANEL BY RT-PCR (FLU A&B, COVID) ARPGX2
Influenza A by PCR: NEGATIVE
Influenza B by PCR: NEGATIVE
SARS Coronavirus 2 by RT PCR: NEGATIVE

## 2020-08-18 LAB — RAPID URINE DRUG SCREEN, HOSP PERFORMED
Amphetamines: NOT DETECTED
Barbiturates: NOT DETECTED
Benzodiazepines: NOT DETECTED
Cocaine: NOT DETECTED
Opiates: NOT DETECTED
Tetrahydrocannabinol: POSITIVE — AB

## 2020-08-18 MED ORDER — METFORMIN HCL 500 MG PO TABS
500.0000 mg | ORAL_TABLET | Freq: Two times a day (BID) | ORAL | Status: DC
  Administered 2020-08-19: 500 mg via ORAL
  Filled 2020-08-18: qty 1

## 2020-08-18 MED ORDER — NORETHINDRONE ACET-ETHINYL EST 1.5-30 MG-MCG PO TABS: 1.0000 | ORAL_TABLET | Freq: Every day | ORAL | Status: DC

## 2020-08-18 MED ORDER — ZIPRASIDONE MESYLATE 20 MG IM SOLR
20.0000 mg | Freq: Once | INTRAMUSCULAR | Status: AC
  Administered 2020-08-18: 20 mg via INTRAMUSCULAR
  Filled 2020-08-18: qty 20

## 2020-08-18 MED ORDER — LORAZEPAM 2 MG/ML IJ SOLN
2.0000 mg | Freq: Once | INTRAMUSCULAR | Status: DC
  Filled 2020-08-18: qty 1

## 2020-08-18 MED ORDER — LORAZEPAM 2 MG/ML IJ SOLN
2.0000 mg | Freq: Once | INTRAMUSCULAR | Status: AC
  Administered 2020-08-18: 2 mg via INTRAMUSCULAR
  Filled 2020-08-18: qty 1

## 2020-08-18 MED ORDER — ALBUTEROL SULFATE HFA 108 (90 BASE) MCG/ACT IN AERS: 1.0000 | INHALATION_SPRAY | Freq: Four times a day (QID) | RESPIRATORY_TRACT | Status: DC | PRN
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
## 2020-08-18 NOTE — H&P (Signed)
Behavioral Health Medical Screening Exam  Samantha Ashley is an 25 y.o. female who presents to [facility] as walk-in with her mother Archie Patten. Patient presents agitated, aggressive, labile, and acutely psychotic. Mom reports IVC paperwork concurrently being completed at magistrate's office by patient's girlfriend.   Patient banging on walls, hitting emergency button, then began to run throughout hallways banging on doors and ripping posters off the wall. Patient yelling tangentially making verbal threats and posturing towards staff. Show of support and verbal redirection utilized via hospital staff to promote patient safety. [employer] called; awaiting IVC paperwork for intervention/transfer.   Unable to complete full assessment due to patient presenting psychotic state. Patient currently presents as a threat to herself and others; actively psychotic. IVC paperwork received; Patient transferred to [facility] ED for further stabilization and treatment via [employer]. Provider report called [phone] to covering EDP Tegeler, MD.   Total Time spent with patient: 45 minutes  Psychiatric Specialty Exam: Physical Exam Psychiatric:        Mood and Affect: Affect is labile, angry and inappropriate.        Speech: Speech is rapid and pressured and tangential.        Behavior: Behavior is agitated, aggressive, hyperactive and combative.        Thought Content: Thought content is paranoid and delusional.        Cognition and Memory: Cognition is impaired. Memory is impaired. She exhibits impaired recent memory and impaired remote memory.        Judgment: Judgment is impulsive and inappropriate.    Review of Systems  Psychiatric/Behavioral: Positive for agitation, behavioral problems, decreased concentration and hallucinations.  All other systems reviewed and are negative.  There were no vitals taken for this visit.There is no height or weight on file to calculate BMI. General Appearance: Bizarre and Disheveled Eye Contact:   Minimal Speech:  Pressured Volume:  Increased Mood:  Angry and Irritable Affect:  Labile Thought Process:  Disorganized, Irrelevant and Descriptions of Associations: Loose Orientation:  Other:  unable to assess due to current state.  Thought Content:  Illogical, Delusions, Paranoid Ideation and Tangential Suicidal Thoughts:  unable to assess due to current state.  Homicidal Thoughts:  unable to assess due to current state.  Memory:  unable to assess due to current state.  Judgement:  Impaired Insight:  none Psychomotor Activity:  Increased and Restlessness Concentration: Concentration: Poor and Attention Span: Poor Recall:  Poor Fund of Knowledge:Poor Language: Poor Akathisia:  NA Handed:   AIMS (if indicated):    Assets:  Physical Health Resilience Social Support Sleep:     Musculoskeletal: Strength & Muscle Tone: increased Gait & Station: normal Patient leans: N/A  There were no vitals taken for this visit.  Recommendations: Based on my evaluation the patient appears to have an emergency medical condition for which I recommend the patient be transferred to the emergency department for further evaluation. Patient transferred to [facility] via [employer] for further stabilization and treatment. IVC paperwork received and given upon transfer to [facility] officers.   Loletta Parish, NP 08/18/2020, 6:42 PM

## 2020-08-18 NOTE — ED Notes (Signed)
Pt removed purewick and refuses to wear purewick. Linen changed due to pt urinating in the bed
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
## 2020-08-18 NOTE — ED Notes (Signed)
Pt calm and resting. Restraints removed.

## 2020-08-18 NOTE — ED Notes (Signed)
Pt sleeping at present

## 2020-08-18 NOTE — ED Notes (Signed)
Pt speaking with TTS 
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
## 2020-08-18 NOTE — ED Notes (Signed)
Pt awake, out of bed, refusing to stay at hospital. Police are being called to come back and sit with pt. MD notified

## 2020-08-18 NOTE — ED Notes (Signed)
Pt ambulatory to bathroom for urine sample.

## 2020-08-18 NOTE — ED Notes (Signed)
Pt placed in violent restraints
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
## 2020-08-18 NOTE — ED Notes (Signed)
Due to pt mental status writer unable to get a lot of information for triage.

## 2020-08-18 NOTE — Progress Notes (Signed)
Samantha Ashley was accompanied by her mother as a walk-in assessment at [facility]. She was brought to the assessment for vitals. Her mother came back to the assessment room to give collateral information, as Samantha Ashley is a poor historian. Samantha Ashley became extremely agitated and unredirectable. Samantha Ashley yelled and screamed at staff. She attempted to elope through every door in the hallway. She also pushed every STARR button. She ripped all the banners off the hallway between the units. Police were called to escort the patient to [facility] where her girlfriend was taking out IVC paperwork. Mom was asked to leave as it seemed Samantha Ashley was more upset with her presence. Mom stayed in the lobby as 3 police officers subdued Samantha Ashley and placed her in handcuffs. All the torn banners are in the [facility] office. It should be noted that only one police officer was present while we had to wait on the IVC paperwork. This AC, along with the counselor and nurse practitioner (as well as most of our staff) remained with this patient for 45 minutes.

## 2020-08-18 NOTE — ED Provider Notes (Signed)
[facility]-EMERGENCY DEPT Provider Note   CSN: [id number] Arrival date & time: 08/18/20  1847     History No chief complaint on file.   Samantha Ashley is a 25 y.o. female.  The history is provided by the patient and medical records.   Samantha Ashley is a 25 y.o. female who presents to the Emergency Department complaining of psychiatric evaluation. She presents the emergency department by police for psychiatric evaluation. She was brought in by her mother to behavioral health as a walk-in evaluation. She was shouting and destructive and she was brought in to the emergency department by police for further evaluation. Patient is shouting in the room, uncooperative stating she wants peace.     Past Medical History:  Diagnosis Date  . Asthma     Patient Active Problem List   Diagnosis Date Noted  . Bipolar 1 disorder, depressed (HCC) 10/28/2018  . Bipolar depression (HCC) 10/15/2018  . Schizoaffective disorder, bipolar type (HCC) 10/10/2018  . Schizophrenia (HCC) 10/10/2018    No past surgical history on file.   OB History   No obstetric history on file.     Family History  Problem Relation Age of Onset  . Healthy Mother   . Healthy Father     Social History   Tobacco Use  . Smoking status: Current Every Day Smoker    Types: Cigars  . Smokeless tobacco: Never Used  Substance Use Topics  . Alcohol use: No  . Drug use: Yes    Types: Marijuana    Home Medications Prior to Admission medications   Medication Sig Start Date End Date Taking? Authorizing Provider  albuterol (VENTOLIN HFA) 108 (90 Base) MCG/ACT inhaler Inhale 1 puff into the lungs every 6 (six) hours as needed for wheezing or shortness of breath.   Yes [provider]  metFORMIN (GLUCOPHAGE) 500 MG tablet Take 500 mg by mouth 2 (two) times daily with a meal.   Yes [provider]  Norethindrone Acetate-Ethinyl Estradiol (LOESTRIN) 1.5-30 MG-MCG tablet Take 1  tablet by mouth daily.   Yes [provider]  tetrahydrozoline-zinc (VISINE-AC) 0.05-0.25 % ophthalmic solution Place 1 drop into both eyes 3 (three) times daily as needed (dry eyes).   Yes [provider]  ARIPiprazole (ABILIFY) 5 MG tablet Take 5 mg by mouth daily. Patient not taking: No sig reported    [provider]  benztropine (COGENTIN) 1 MG tablet Take 1 tablet (1 mg total) by mouth 2 (two) times daily. Patient not taking: No sig reported 10/21/18   Malvin Johns, MD  divalproex (DEPAKOTE ER) 500 MG 24 hr tablet 1 in am 2 at h s Patient not taking: No sig reported 10/13/18   Malvin Johns, MD  haloperidol (HALDOL) 5 MG tablet 1 in am 3 at hs Patient not taking: No sig reported 11/03/18   Malvin Johns, MD  omega-3 acid ethyl esters (LOVAZA) 1 g capsule Take 1 capsule (1 g total) by mouth 2 (two) times daily. Patient not taking: No sig reported 10/13/18   Malvin Johns, MD  ondansetron (ZOFRAN) 4 MG tablet Take 1 tablet (4 mg total) by mouth every 6 (six) hours. Patient not taking: No sig reported 05/03/19   Georgetta Haber, NP  Prenatal Vit-Fe Fumarate-FA (PRENATAL MULTIVITAMIN) TABS tablet Take 1 tablet by mouth daily. Patient not taking: No sig reported 10/14/18   Malvin Johns, MD  propranolol (INDERAL) 40 MG tablet Take 1.5 tablets (60 mg total) by mouth  2 (two) times daily. Patient not taking: No sig reported 10/13/18   Malvin Johns, MD  risperiDONE microspheres (RISPERDAL CONSTA) 50 MG injection Inject 2 mLs (50 mg total) into the muscle every 14 (fourteen) days. Due 7/16 Patient not taking: No sig reported 10/21/18   Malvin Johns, MD  traZODone (DESYREL) 100 MG tablet Take 2 tablets (200 mg total) by mouth at bedtime. Patient not taking: No sig reported 10/21/18   Malvin Johns, MD    Allergies    Coconut oil  Review of Systems   Review of Systems  All other systems reviewed and are negative.   Physical Exam Updated Vital Signs BP 135/78   Pulse 88    Temp 98.3 F (36.8 C)   Resp 16   Ht 5\' 5"  (1.651 m)   Wt 113 kg   SpO2 100%   BMI 41.46 kg/m   Physical Exam Vitals and nursing note reviewed.  Constitutional:      Appearance: She is well-developed.  HENT:     Head: Normocephalic and atraumatic.  Cardiovascular:     Rate and Rhythm: Regular rhythm. Tachycardia present.  Pulmonary:     Effort: Pulmonary effort is normal. No respiratory distress.  Musculoskeletal:        General: No tenderness.  Skin:    General: Skin is warm and dry.  Neurological:     Mental Status: She is alert and oriented to person, place, and time.  Psychiatric:     Comments: Shouting, agitated, difficult to redirect     ED Results / Procedures / Treatments   Labs (all labs ordered are listed, but only abnormal results are displayed) Labs Reviewed  COMPREHENSIVE METABOLIC PANEL - Abnormal; Notable for the following components:      Result Value   Sodium 133 (*)    Potassium 3.1 (*)    CO2 15 (*)    Creatinine, Ser 1.22 (*)    Total Protein 8.6 (*)    Anion gap 18 (*)    All other components within normal limits  CBC WITH DIFFERENTIAL/PLATELET - Abnormal; Notable for the following components:   WBC 12.5 (*)    RDW 16.1 (*)    Neutro Abs 9.2 (*)    All other components within normal limits  RAPID URINE DRUG SCREEN, HOSP PERFORMED - Abnormal; Notable for the following components:   Tetrahydrocannabinol POSITIVE (*)    All other components within normal limits  RESP PANEL BY RT-PCR (FLU A&B, COVID) ARPGX2  ETHANOL  I-STAT BETA HCG BLOOD, ED ([facility], [facility], [facility] ONLY)    EKG None  Radiology No results found.  Procedures Procedures  CRITICAL CARE Performed by:   Total critical care time: 35 minutes  Critical care time was exclusive of separately billable procedures and treating other patients.  Critical care was necessary to treat or prevent imminent or life-threatening deterioration.  Critical care was time spent  personally by me on the following activities: development of treatment plan with patient and/or surrogate as well as nursing, discussions with consultants, evaluation of patient's response to treatment, examination of patient, obtaining history from patient or surrogate, ordering and performing treatments and interventions, ordering and review of laboratory studies, ordering and review of radiographic studies, pulse oximetry and re-evaluation of patient's condition.  Medications Ordered in ED Medications  LORazepam (ATIVAN) injection 2 mg (has no administration in time range)  ziprasidone (GEODON) injection 20 mg (20 mg Intramuscular Given 08/18/20 1902)  LORazepam (ATIVAN) injection 2 mg (2  mg Intramuscular Given 08/18/20 1946)    ED Course  I have reviewed the triage vital signs and the nursing notes.  Pertinent labs & imaging results that were available during my care of the patient were reviewed by me and considered in my medical decision making (see chart for details).    MDM Rules/Calculators/A&P                         patient brought from behavioral health for agitated and aggressive behavior. Patient agitated and shouting on ED presentation, required Geodon for her agitation. Patient had persistent agitation and required multiple repeat doses of Ativan as well. She has been medically cleared for psychiatric evaluation and treatment.  Final Clinical Impression(s) / ED Diagnoses Final diagnoses:  None    Rx / DC Orders ED Discharge Orders    None       Tilden Fossa, MD 08/18/20 2351

## 2020-08-19 ENCOUNTER — Inpatient Hospital Stay (HOSPITAL_COMMUNITY)
Admission: AD | Admit: 2020-08-19 | Discharge: 2020-08-26 | DRG: 885 | Disposition: A | Discharge status: Home / Self Care | Payer: 59 | Source: Intra-hospital | Attending: Psychiatry | Admitting: Psychiatry

## 2020-08-19 DIAGNOSIS — F250 Schizoaffective disorder, bipolar type: Principal | ICD-10-CM | POA: Diagnosis present

## 2020-08-19 DIAGNOSIS — G4700 Insomnia, unspecified: Secondary | ICD-10-CM | POA: Diagnosis present

## 2020-08-19 DIAGNOSIS — R000 Tachycardia, unspecified: Secondary | ICD-10-CM | POA: Diagnosis present

## 2020-08-19 DIAGNOSIS — J45909 Unspecified asthma, uncomplicated: Secondary | ICD-10-CM | POA: Diagnosis present

## 2020-08-19 DIAGNOSIS — Z20822 Contact with and (suspected) exposure to covid-19: Secondary | ICD-10-CM | POA: Diagnosis present

## 2020-08-19 DIAGNOSIS — E876 Hypokalemia: Secondary | ICD-10-CM | POA: Diagnosis present

## 2020-08-19 DIAGNOSIS — F259 Schizoaffective disorder, unspecified: Secondary | ICD-10-CM | POA: Diagnosis present

## 2020-08-19 DIAGNOSIS — Z79899 Other long term (current) drug therapy: Secondary | ICD-10-CM | POA: Diagnosis not present

## 2020-08-19 DIAGNOSIS — F1210 Cannabis abuse, uncomplicated: Secondary | ICD-10-CM | POA: Diagnosis present

## 2020-08-19 DIAGNOSIS — F17290 Nicotine dependence, other tobacco product, uncomplicated: Secondary | ICD-10-CM | POA: Diagnosis present

## 2020-08-19 DIAGNOSIS — I10 Essential (primary) hypertension: Secondary | ICD-10-CM | POA: Diagnosis present

## 2020-08-19 DIAGNOSIS — R451 Restlessness and agitation: Secondary | ICD-10-CM | POA: Diagnosis present

## 2020-08-19 LAB — CBG MONITORING, ED: Glucose-Capillary: 109 mg/dL — ABNORMAL HIGH (ref 70–99)

## 2020-08-19 MED ORDER — ZIPRASIDONE MESYLATE 20 MG IM SOLR
INTRAMUSCULAR | Status: AC
  Administered 2020-08-19: 20 mg via INTRAMUSCULAR
  Filled 2020-08-19: qty 20

## 2020-08-19 MED ORDER — LIP MEDEX EX OINT
TOPICAL_OINTMENT | Freq: Once | CUTANEOUS | Status: AC
  Filled 2020-08-19: qty 7

## 2020-08-19 MED ORDER — HALOPERIDOL 5 MG PO TABS
5.0000 mg | ORAL_TABLET | Freq: Every day | ORAL | Status: DC
  Administered 2020-08-19 – 2020-08-21 (×3): 5 mg via ORAL
  Filled 2020-08-19 (×5): qty 1

## 2020-08-19 MED ORDER — HALOPERIDOL LACTATE 5 MG/ML IJ SOLN
10.0000 mg | Freq: Every day | INTRAMUSCULAR | Status: DC
  Filled 2020-08-19 (×3): qty 2

## 2020-08-19 MED ORDER — HALOPERIDOL 5 MG PO TABS
10.0000 mg | ORAL_TABLET | Freq: Every day | ORAL | Status: DC
  Administered 2020-08-19 – 2020-08-20 (×2): 10 mg via ORAL
  Filled 2020-08-19 (×4): qty 2

## 2020-08-19 MED ORDER — ZIPRASIDONE MESYLATE 20 MG IM SOLR: 20.0000 mg | INTRAMUSCULAR | Status: DC | PRN

## 2020-08-19 MED ORDER — LORAZEPAM 1 MG PO TABS: 1.0000 mg | ORAL_TABLET | ORAL | Status: DC | PRN

## 2020-08-19 MED ORDER — HALOPERIDOL LACTATE 5 MG/ML IJ SOLN: 2.0000 mg | Freq: Once | INTRAMUSCULAR | Status: DC

## 2020-08-19 MED ORDER — ZIPRASIDONE MESYLATE 20 MG IM SOLR
20.0000 mg | Freq: Four times a day (QID) | INTRAMUSCULAR | Status: DC | PRN
  Administered 2020-08-19: 20 mg via INTRAMUSCULAR
  Filled 2020-08-19: qty 20

## 2020-08-19 MED ORDER — ZIPRASIDONE MESYLATE 20 MG IM SOLR: 20.0000 mg | Freq: Once | INTRAMUSCULAR | Status: AC

## 2020-08-19 MED ORDER — OLANZAPINE 10 MG PO TBDP: 10.0000 mg | ORAL_TABLET | Freq: Three times a day (TID) | ORAL | Status: DC | PRN

## 2020-08-19 MED ORDER — TRAZODONE HCL 50 MG PO TABS
50.0000 mg | ORAL_TABLET | Freq: Every evening | ORAL | Status: DC | PRN
  Administered 2020-08-19 – 2020-08-20 (×2): 50 mg via ORAL
  Filled 2020-08-19 (×2): qty 1

## 2020-08-19 MED ORDER — CLONIDINE HCL 0.1 MG PO TABS: 0.1000 mg | ORAL_TABLET | Freq: Three times a day (TID) | ORAL | Status: DC | PRN

## 2020-08-19 MED ORDER — ALUM & MAG HYDROXIDE-SIMETH 200-200-20 MG/5ML PO SUSP: 30.0000 mL | ORAL | Status: DC | PRN

## 2020-08-19 MED ORDER — LORAZEPAM 1 MG PO TABS
1.0000 mg | ORAL_TABLET | Freq: Four times a day (QID) | ORAL | Status: DC | PRN
Start: 2020-08-19 — End: 2020-08-26
  Administered 2020-08-19 – 2020-08-25 (×6): 1 mg via ORAL
  Filled 2020-08-19 (×6): qty 1

## 2020-08-19 MED ORDER — OLANZAPINE 10 MG PO TBDP
10.0000 mg | ORAL_TABLET | Freq: Three times a day (TID) | ORAL | Status: DC | PRN
  Administered 2020-08-19 – 2020-08-22 (×3): 10 mg via ORAL
  Filled 2020-08-19 (×3): qty 1

## 2020-08-19 MED ORDER — MAGNESIUM HYDROXIDE 400 MG/5ML PO SUSP: 30.0000 mL | Freq: Every day | ORAL | Status: DC | PRN

## 2020-08-19 MED ORDER — ACETAMINOPHEN 325 MG PO TABS
650.0000 mg | ORAL_TABLET | Freq: Four times a day (QID) | ORAL | Status: DC | PRN
  Administered 2020-08-20 – 2020-08-22 (×3): 650 mg via ORAL
  Filled 2020-08-19 (×3): qty 2

## 2020-08-19 MED ORDER — HALOPERIDOL LACTATE 5 MG/ML IJ SOLN: 3.0000 mg | Freq: Once | INTRAMUSCULAR | Status: DC

## 2020-08-19 MED ORDER — STERILE WATER FOR INJECTION IJ SOLN
INTRAMUSCULAR | Status: AC
  Administered 2020-08-19: 1 mL
  Filled 2020-08-19: qty 10

## 2020-08-19 MED ORDER — HALOPERIDOL LACTATE 5 MG/ML IJ SOLN
5.0000 mg | Freq: Every day | INTRAMUSCULAR | Status: DC
  Filled 2020-08-19 (×5): qty 1

## 2020-08-19 NOTE — ED Notes (Signed)
Patient allowed to make call to her dad and her fiance.  Patient asking to call her mom so she can come visit but she doesn't have any more phone calls as she has met her 3 calls for the day and she became upset after speaking with mother earlier and was acting out.  Per Customer service manager, RN charge patient cannot have visit from her mom at this time due to her behaviors this shift.  Patient dressed out in purple scrubs per Chong Sicilian, Therapist, sports.

## 2020-08-19 NOTE — Tx Team (Signed)
Initial Treatment Plan 08/19/2020 7:20 PM CLEO SANTUCCI [id number]    PATIENT STRESSORS: Health problems Medication change or noncompliance   PATIENT STRENGTHS: Ability for insight Active sense of humor Average or above average intelligence Capable of independent living Financial means General fund of knowledge Motivation for treatment/growth Physical Health Religious Affiliation Supportive family/friends Work skills   PATIENT IDENTIFIED PROBLEMS: Bipolar -Maniea  anxiety  depression  SI -               DISCHARGE CRITERIA:  Ability to meet basic life and health needs Adequate post-discharge living arrangements Improved stabilization in mood, thinking, and/or behavior Medical problems require only outpatient monitoring Motivation to continue treatment in a less acute level of care Need for constant or close observation no longer present Reduction of life-threatening or endangering symptoms to within safe limits Safe-care adequate arrangements made Verbal commitment to aftercare and medication compliance  PRELIMINARY DISCHARGE PLAN: Attend aftercare/continuing care group Outpatient therapy Return to previous living arrangement Return to previous work or school arrangements  PATIENT/FAMILY INVOLVEMENT: This treatment plan has been presented to and reviewed with the patient, LANICE FOLDEN .The patient has been given the opportunity to ask questions and make suggestions.  Wardell Heath, RN 08/19/2020, 7:20 PM
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
## 2020-08-19 NOTE — ED Notes (Signed)
behavioral counselor at bedside.

## 2020-08-19 NOTE — ED Notes (Signed)
Per Consulting civil engineer, pt will be allowed a 30 minute visit with her mom. If the pt starts to get agitated, the mother will be asked to leave. Pt verbalized understanding.

## 2020-08-19 NOTE — Progress Notes (Signed)
Skin Assessment:  Patient has bruises on her R hand/wrist and R arm.  Bruises on L hand/wrist and arm.  Tattoos on R lower arm, L upper and lower arm.  Tattoos on R upper leg, L shoulder behind R ear.

## 2020-08-19 NOTE — ED Notes (Addendum)
Patient is very worked up, hyper religious and yelling out praying.  Then she is hysterically crying with the sitter.  [place], EDP made aware and order was received to give patient IM Geodon.  Security made aware and present in case patient does not allow nurse to give the injection.  However, patient asked for security not to touch her and she was made aware that they will not touch her if she allows [place] to give the ordered medication which she did. Patient is stating that she is not going anywhere today except home, TTS has recommended inpatient behavioral stay.  Patient now yelling in room, cussing and praying.

## 2020-08-19 NOTE — ED Notes (Signed)
Pt allowed for vital signs to be completed. Afterwards, she started screaming and is undirectable.

## 2020-08-19 NOTE — ED Notes (Signed)
Patient transported to [facility] by [facility] and belongings/IVC paperwork sent with patient

## 2020-08-19 NOTE — Progress Notes (Signed)
   08/19/20 1700  Vital Signs  Temp 98.2 F (36.8 C)  Temp Source Oral  Pulse Rate (!) 101  Pulse Rate Source Dinamap  Resp 18  BP 130/66  BP Location Left Arm  BP Method Automatic  Patient Position (if appropriate) Sitting  Oxygen Therapy  SpO2 100 %  Pain Assessment  Pain Scale 0-10  Pain Score 0  Height and Weight  Height 5\' 5"  (1.651 m)  Weight 113.4 kg  Type of Scale Used Standing  Type of Weight Actual  BSA (Calculated - sq m) 2.28 sq meters  BMI (Calculated) 41.6  Weight in (lb) to have BMI = 25 149.9   Initial Nursing Assessment  D: Patient is a 25 y.o. AA female admitted IVC from  [facility] for manic bazaar behavior and trying to jump off balcony. According to the nurse to nurse report pt. Was talking to walls and objects, and was getting aggressive with others. Yesterday pt came to [facility] with her mother as a walk-in. She was in the hall, she tore all of the  signs off the walls and doors, trying to rip signs with her teeth, put a whole in the wall by swinging the door open, she was getting in staffs' face. Pt was trying to get in all of the doors. Police were called, Pt was handcuffed and taken to [facility]  Pt.has a hx of  Bipolar, Schizoaffective disorder. During today's interview pt. Was manic, rapidly talking, gesturing with her arms. Pt repeated "People just don't understand mental illness!" Pt.  Was hyper religous, saying "praise God!" pt was loud on the phone. Pt. Went to the cafeteria for dinner. When she returned, pt. Was on the phone and started to get angry. Pt. Was labile,paciing the halls and yelling out. Pt. Started slamming doors. Pt. Denies SI/HI/AVH. Pt. Denies depression, but admits to her mania.  A:  Patient took scheduled medicine.  Pt took 10 mg of Zyprexa ODT , 1 mg of Ativan po, and 20 mg of Geodon IM. Support and encouragement provided Routine safety checks conducted every 15 minutes. Patient  Informed to notify staff with any concerns.   R: Pt contracts for  safety. Safety maintained.

## 2020-08-19 NOTE — Consult Note (Addendum)
[facility] Face-to-Face Psychiatry Consult   Reason for Consult:  Psychiatric Reassessment aggressive behaviors. Referring Physician:  Dr. Anitra Lauth  Patient Identification: Samantha Ashley MRN:  [id number] Principal Diagnosis: Schizoaffective disorder, bipolar type (HCC) Diagnosis:  Principal Problem:   Schizoaffective disorder, bipolar type (HCC)   Total Time spent with patient: 20 minutes  Subjective:   Samantha Ashley is a 25 y.o. female patient was originally seen at the [facility] as a walk in. During the evaluation she became aggressive and was pulling items off the wall. Her partner signed an IVC order and the patient was brought to the [facility] escorted by police.  Samantha Ashley, 25 y.o., female patient seen face to face by this provider, consulted with Dr. Lucianne Muss; and chart reviewed on 08/19/20.  On evaluation Samantha Ashley reports " I am therapy I am the light".  HPI:    During evaluation Samantha Ashley is laying in the bed in no acute distress.  She is alert and oriented to person and year. Was not oriented to place or situation. She is cooperative but is agitated easlily. Her voice varies from calm to pressured and fast. Her mood is labile with congruent affect. During evaluation she varied from teary to laughing at times. She states her mood is "wonderful".  She does not appear to be responding to internal/external stimuli.Thought process is tangential. Patient is grandiose and hyper religious. Patient reports she has  racing thoughts in her head and states "I cant tell what they are they are going to fast".  Reports that she hears a voice in her head. States the voice talks to her. She did not elaborate any further. Patient has delusional thinking. She repeated several times, "I am the light and I am therapy. Jesus talks to me" Patient denies suicidal/self-harm/homicidal ideation and paranoia.  Past Psychiatric History: schizoaffective disorder, bipolar type   Risk to Self:  pt  denies Risk to Others:  pt denies Prior Inpatient Therapy:  unknown Prior Outpatient Therapy:  unkown  Past Medical History:  Past Medical History:  Diagnosis Date  . Asthma    No past surgical history on file. Family History:  Family History  Problem Relation Age of Onset  . Healthy Mother   . Healthy Father    Family Psychiatric  History: Unkown Social History:  Social History   Substance and Sexual Activity  Alcohol Use No     Social History   Substance and Sexual Activity  Drug Use Yes  . Types: Marijuana    Social History   Socioeconomic History  . Marital status: Single    Spouse name: Not on file  . Number of children: Not on file  . Years of education: Not on file  . Highest education level: Not on file  Occupational History  . Not on file  Tobacco Use  . Smoking status: Current Every Day Smoker    Types: Cigars  . Smokeless tobacco: Never Used  Substance and Sexual Activity  . Alcohol use: No  . Drug use: Yes    Types: Marijuana  . Sexual activity: Yes  Other Topics Concern  . Not on file  Social History Narrative  . Not on file   Social Determinants of Health   Financial Resource Strain: Not on file  Food Insecurity: Not on file  Transportation Needs: Not on file  Physical Activity: Not on file  Stress: Not on file  Social Connections: Not on file   Additional Social History:  Allergies:   Allergies  Allergen Reactions  . Coconut Oil Itching    Labs:  Results for orders placed or performed during the hospital encounter of 08/18/20 (from the past 48 hour(s))  Comprehensive metabolic panel     Status: Abnormal   Collection Time: 08/18/20  6:57 PM  Result Value Ref Range   Sodium 133 (L) 135 - 145 mmol/L   Potassium 3.1 (L) 3.5 - 5.1 mmol/L   Chloride 100 98 - 111 mmol/L   CO2 15 (L) 22 - 32 mmol/L   Glucose, Bld 92 70 - 99 mg/dL    Comment: Glucose reference range applies only to samples taken after fasting for at least 8  hours.   BUN 7 6 - 20 mg/dL   Creatinine, Ser 0.09 (H) 0.44 - 1.00 mg/dL   Calcium 9.5 8.9 - 23.3 mg/dL   Total Protein 8.6 (H) 6.5 - 8.1 g/dL   Albumin 4.3 3.5 - 5.0 g/dL   AST 35 15 - 41 U/L   ALT 27 0 - 44 U/L   Alkaline Phosphatase 56 38 - 126 U/L   Total Bilirubin 0.6 0.3 - 1.2 mg/dL   GFR, Estimated >00 >76 mL/min    Comment: (NOTE) Calculated using the CKD-EPI Creatinine Equation (2021)    Anion gap 18 (H) 5 - 15    Comment: Performed at [facility], [address], [place], [place] [address]  CBC with Differential     Status: Abnormal   Collection Time: 08/18/20  6:57 PM  Result Value Ref Range   WBC 12.5 (H) 4.0 - 10.5 K/uL   RBC 4.59 3.87 - 5.11 MIL/uL   Hemoglobin 12.1 12.0 - 15.0 g/dL   HCT 35.4 56.2 - 56.3 %   MCV 81.0 80.0 - 100.0 fL   MCH 26.4 26.0 - 34.0 pg   MCHC 32.5 30.0 - 36.0 g/dL   RDW 89.3 (H) 73.4 - 28.7 %   Platelets 270 150 - 400 K/uL   nRBC 0.0 0.0 - 0.2 %   Neutrophils Relative % 75 %   Neutro Abs 9.2 (H) 1.7 - 7.7 K/uL   Lymphocytes Relative 19 %   Lymphs Abs 2.4 0.7 - 4.0 K/uL   Monocytes Relative 6 %   Monocytes Absolute 0.8 0.1 - 1.0 K/uL   Eosinophils Relative 0 %   Eosinophils Absolute 0.0 0.0 - 0.5 K/uL   Basophils Relative 0 %   Basophils Absolute 0.0 0.0 - 0.1 K/uL   Immature Granulocytes 0 %   Abs Immature Granulocytes 0.04 0.00 - 0.07 K/uL    Comment: Performed at [facility], [address], [place], [place] [address]  Urine rapid drug screen (hosp performed)     Status: Abnormal   Collection Time: 08/18/20  6:57 PM  Result Value Ref Range   Opiates NONE DETECTED NONE DETECTED   Cocaine NONE DETECTED NONE DETECTED   Benzodiazepines NONE DETECTED NONE DETECTED   Amphetamines NONE DETECTED NONE DETECTED   Tetrahydrocannabinol POSITIVE (A) NONE DETECTED   Barbiturates NONE DETECTED NONE DETECTED    Comment: (NOTE) DRUG SCREEN FOR MEDICAL PURPOSES ONLY.  IF CONFIRMATION IS NEEDED FOR ANY  PURPOSE, NOTIFY LAB WITHIN 5 DAYS.  LOWEST DETECTABLE LIMITS FOR URINE DRUG SCREEN Drug Class                     Cutoff (ng/mL) Amphetamine and metabolites    1000 Barbiturate and metabolites    200 Benzodiazepine  200 Tricyclics and metabolites     300 Opiates and metabolites        300 Cocaine and metabolites        300 THC                            50 Performed at [facility], [address] [place], [place] [address]   I-Stat beta hCG blood, ED     Status: None   Collection Time: 08/18/20  7:47 PM  Result Value Ref Range   I-stat hCG, quantitative <5.0 <5 mIU/mL   Comment 3            Comment:   GEST. AGE      CONC.  (mIU/mL)   <=1 WEEK        5 - 50     2 WEEKS       50 - 500     3 WEEKS       100 - 10,000     4 WEEKS     1,000 - 30,000        FEMALE AND NON-PREGNANT FEMALE:     LESS THAN 5 mIU/mL   Resp Panel by RT-PCR (Flu A&B, Covid) Nasopharyngeal Swab     Status: None   Collection Time: 08/18/20  8:41 PM   Specimen: Nasopharyngeal Swab; Nasopharyngeal(NP) swabs in vial transport medium  Result Value Ref Range   SARS Coronavirus 2 by RT PCR NEGATIVE NEGATIVE    Comment: (NOTE) SARS-CoV-2 target nucleic acids are NOT DETECTED.  The SARS-CoV-2 RNA is generally detectable in upper respiratory specimens during the acute phase of infection. The lowest concentration of SARS-CoV-2 viral copies this assay can detect is 138 copies/mL. A negative result does not preclude SARS-Cov-2 infection and should not be used as the sole basis for treatment or other patient management decisions. A negative result may occur with  improper specimen collection/handling, submission of specimen other than nasopharyngeal swab, presence of viral mutation(s) within the areas targeted by this assay, and inadequate number of viral copies(<138 copies/mL). A negative result must be combined with clinical observations, patient history, and  epidemiological information. The expected result is Negative.  Fact Sheet for Patients:  BloggerCourse.com  Fact Sheet for Healthcare Providers:  SeriousBroker.it  This test is no t yet approved or cleared by the [place] FDA and  has been authorized for detection and/or diagnosis of SARS-CoV-2 by FDA under an Emergency Use Authorization (EUA). This EUA will remain  in effect (meaning this test can be used) for the duration of the COVID-19 declaration under Section 564(b)(1) of the Act, 21 U.S.C.section 360bbb-3(b)(1), unless the authorization is terminated  or revoked sooner.       Influenza A by PCR NEGATIVE NEGATIVE   Influenza B by PCR NEGATIVE NEGATIVE    Comment: (NOTE) The Xpert Xpress SARS-CoV-2/FLU/RSV plus assay is intended as an aid in the diagnosis of influenza from Nasopharyngeal swab specimens and should not be used as a sole basis for treatment. Nasal washings and aspirates are unacceptable for Xpert Xpress SARS-CoV-2/FLU/RSV testing.  Fact Sheet for Patients: BloggerCourse.com  Fact Sheet for Healthcare Providers: SeriousBroker.it  This test is not yet approved or cleared by the [place] FDA and has been authorized for detection and/or diagnosis of SARS-CoV-2 by FDA under an Emergency Use Authorization (EUA). This EUA will remain in effect (meaning this test can be used) for the duration of the  COVID-19 declaration under Section 564(b)(1) of the Act, 21 U.S.C. section 360bbb-3(b)(1), unless the authorization is terminated or revoked.  Performed at [facility], [address] [place], [place] [address]   CBG monitoring, ED     Status: Abnormal   Collection Time: 08/19/20  3:50 PM  Result Value Ref Range   Glucose-Capillary 109 (H) 70 - 99 mg/dL    Comment: Glucose reference range applies only to samples taken after fasting for  at least 8 hours.    Current Facility-Administered Medications  Medication Dose Route Frequency Provider Last Rate Last Admin  . albuterol (VENTOLIN HFA) 108 (90 Base) MCG/ACT inhaler 1 puff  1 puff Inhalation Q6H PRN Tilden Fossa, MD      . haloperidol lactate (HALDOL) injection 2 mg  2 mg Intramuscular Once Graciella Freer A, PA-C      . haloperidol lactate (HALDOL) injection 3 mg  3 mg Intramuscular Once Graciella Freer A, PA-C      . LORazepam (ATIVAN) injection 2 mg  2 mg Intramuscular Once Tilden Fossa, MD      . metFORMIN (GLUCOPHAGE) tablet 500 mg  500 mg Oral BID WC Tilden Fossa, MD   500 mg at 08/19/20 0830  . Norethindrone Acetate-Ethinyl Estradiol (LOESTRIN) 1.5-30 MG-MCG tablet 1 tablet  1 tablet Oral Daily Tilden Fossa, MD       Current Outpatient Medications  Medication Sig Dispense Refill  . albuterol (VENTOLIN HFA) 108 (90 Base) MCG/ACT inhaler Inhale 1 puff into the lungs every 6 (six) hours as needed for wheezing or shortness of breath.    . metFORMIN (GLUCOPHAGE) 500 MG tablet Take 500 mg by mouth 2 (two) times daily with a meal.    . Norethindrone Acetate-Ethinyl Estradiol (LOESTRIN) 1.5-30 MG-MCG tablet Take 1 tablet by mouth daily.    Marland Kitchen tetrahydrozoline-zinc (VISINE-AC) 0.05-0.25 % ophthalmic solution Place 1 drop into both eyes 3 (three) times daily as needed (dry eyes).    . ARIPiprazole (ABILIFY) 5 MG tablet Take 5 mg by mouth daily. (Patient not taking: No sig reported)    . benztropine (COGENTIN) 1 MG tablet Take 1 tablet (1 mg total) by mouth 2 (two) times daily. (Patient not taking: No sig reported) 60 tablet 2  . divalproex (DEPAKOTE ER) 500 MG 24 hr tablet 1 in am 2 at h s (Patient not taking: No sig reported) 90 tablet 2  . haloperidol (HALDOL) 5 MG tablet 1 in am 3 at hs (Patient not taking: No sig reported) 120 tablet 2  . omega-3 acid ethyl esters (LOVAZA) 1 g capsule Take 1 capsule (1 g total) by mouth 2 (two) times daily. (Patient not taking: No  sig reported) 60 capsule 2  . ondansetron (ZOFRAN) 4 MG tablet Take 1 tablet (4 mg total) by mouth every 6 (six) hours. (Patient not taking: No sig reported) 12 tablet 0  . Prenatal Vit-Fe Fumarate-FA (PRENATAL MULTIVITAMIN) TABS tablet Take 1 tablet by mouth daily. (Patient not taking: No sig reported) 90 tablet 2  . propranolol (INDERAL) 40 MG tablet Take 1.5 tablets (60 mg total) by mouth 2 (two) times daily. (Patient not taking: No sig reported) 60 tablet 3  . risperiDONE microspheres (RISPERDAL CONSTA) 50 MG injection Inject 2 mLs (50 mg total) into the muscle every 14 (fourteen) days. Due 7/16 (Patient not taking: No sig reported) 1 each 11  . traZODone (DESYREL) 100 MG tablet Take 2 tablets (200 mg total) by mouth at bedtime. (Patient not taking: No sig  reported) 60 tablet 2    Musculoskeletal: Strength & Muscle Tone: within normal limits Gait & Station: normal Patient leans: Right  Psychiatric Specialty Exam:  Presentation  General Appearance: Disheveled  Eye Contact:Poor  Speech:Pressured; Clear and Coherent  Speech Volume:Increased  Handedness:Right   Mood and Affect  Mood:Labile  Affect:Congruent   Thought Process  Thought Processes:Coherent  Descriptions of Associations:Tangential  Orientation:Full (Time, Place and Person)  Thought Content:Delusions; Tangential  History of Schizophrenia/Schizoaffective disorder:Yes  Duration of Psychotic Symptoms:Greater than six months  Hallucinations:Hallucinations: Auditory Description of Auditory Hallucinations: reprots she hears her own voice and with racing thoughts  Ideas of Reference:Delusions  Suicidal Thoughts:Suicidal Thoughts: No  Homicidal Thoughts:Homicidal Thoughts: No   Sensorium  Memory:Immediate Fair; Recent Fair; Remote Fair  Judgment:Poor  Insight:Poor   Executive Functions  Concentration:Poor  Attention Span:Poor  Recall:Fair  Fund of  Knowledge:Fair  Language:Good   Psychomotor Activity  Psychomotor Activity:Psychomotor Activity: Normal   Assets  Assets:Financial Resources/Insurance; Housing; Physical Health; Resilience; Social Support   Sleep  Sleep:Sleep: Poor Number of Hours of Sleep: 0   Physical Exam: Physical Exam HENT:     Head: Normocephalic.     Right Ear: Tympanic membrane normal.     Left Ear: Tympanic membrane normal.     Nose: Nose normal.     Mouth/Throat:     Mouth: Mucous membranes are dry.     Pharynx: Oropharynx is clear.  Eyes:     Conjunctiva/sclera: Conjunctivae normal.  Cardiovascular:     Rate and Rhythm: Normal rate.     Pulses: Normal pulses.  Pulmonary:     Effort: Pulmonary effort is normal. No respiratory distress.  Abdominal:     Tenderness: There is no guarding.  Musculoskeletal:        General: Normal range of motion.     Cervical back: Normal range of motion.  Skin:    General: Skin is warm and dry.     Capillary Refill: Capillary refill takes less than 2 seconds.  Neurological:     Mental Status: She is alert. She is disoriented.  Psychiatric:        Attention and Perception: She is inattentive.        Mood and Affect: Affect is labile.        Speech: Speech is rapid and pressured and tangential.        Behavior: Behavior is agitated.        Thought Content: Thought content is delusional. Thought content does not include homicidal or suicidal ideation. Thought content does not include homicidal or suicidal plan.        Cognition and Memory: She exhibits impaired recent memory.        Judgment: Judgment is impulsive.    Review of Systems  Constitutional: Negative.   HENT: Negative.   Eyes: Negative.   Respiratory: Negative.   Cardiovascular: Negative.   Gastrointestinal: Negative.   Genitourinary: Negative.   Musculoskeletal: Negative.   Skin: Negative.   Neurological: Negative.   Endo/Heme/Allergies: Negative.   Psychiatric/Behavioral: Positive for  hallucinations.   Blood pressure (!) 132/107, pulse 96, temperature 97.8 F (36.6 C), temperature source Oral, resp. rate 18, height  (1.651 m), weight 113 kg, SpO2 100 %. Body mass index is 41.46 kg/m.  Treatment Plan/Recommendations: 1. Admit for crisis management and stabilization, estimated length of stay 3-5 days.  2. Medication management to reduce current symptoms to base line and improve the patient's overall level of functioning  3. Treat health  problems as indicated.  4. Develop treatment plan to decrease risk of relapse upon discharge and the need for readmission.  5. Psycho-social education regarding relapse prevention and self care.  6. Health care follow up as needed for medical problems.  7. Review, reconcile, and reinstate any pertinent home medications for other health issues where appropriate. 8. Call for consults with hospitalist for any additional specialty patient care services as needed.  Disposition: Recommend psychiatric Inpatient admission when medically cleared.  Ardis Hughs, NP 08/19/2020 4:22 PM

## 2020-08-19 NOTE — BH Assessment (Signed)
ERROR

## 2020-08-19 NOTE — ED Notes (Signed)
Pt hit the emergency button on the wall to get staff attention. In the room, the patient was expressing her needs: to go home and to call her wife and mother. Staff explained constantly that we cannot meet these requests. It was explained to her that she is able to make phone calls after 10 am and she is not able to leave since she is under IVC. She did not accept this, but said we could give her some water and leave her alone.

## 2020-08-19 NOTE — BH Assessment (Signed)
[facility] Assessment Progress Note  Per Vernard Gambles, NP, this pt requires psychiatric hospitalization.  Linsey, RN, [facility] has assigned pt to [facility] Rm 507-1 to the service of Landry Mellow, MD.  Pt presents under IVC initiated by pt's fiance, and upheld by EDP Tilden Fossa, MD, and IVC documents have been faxed to [facility].  EDP 's Gwyneth Sprout, MD and Lorre Nick MD, as well as pt's nurse, Garald Balding, have been notified, and Garald Balding agrees to call report to [phone].  Pt is to be transported via [employer].   Doylene Canning, [place] [facility] Coordinator [phone]

## 2020-08-19 NOTE — ED Notes (Signed)
Report called and given to Erica,RN at [facility].

## 2020-08-19 NOTE — ED Notes (Signed)
Pt wandered up to triage area stating she's ready to go home. Triage RN redirected pt to room and advised primary RN of pt leaving room.

## 2020-08-19 NOTE — ED Notes (Signed)
Pt refusing vitals.

## 2020-08-20 DIAGNOSIS — F259 Schizoaffective disorder, unspecified: Secondary | ICD-10-CM

## 2020-08-20 LAB — LIPID PANEL
Cholesterol: 86 mg/dL (ref 0–200)
HDL: 23 mg/dL — ABNORMAL LOW (ref >40)
LDL Cholesterol: 44 mg/dL (ref 0–99)
Total CHOL/HDL Ratio: 3.7 RATIO
Triglycerides: 93 mg/dL (ref <150)
VLDL: 19 mg/dL (ref 0–40)

## 2020-08-20 LAB — HEMOGLOBIN A1C
Hgb A1c MFr Bld: 5.1 % (ref 4.8–5.6)
Mean Plasma Glucose: 99.67 mg/dL

## 2020-08-20 LAB — TSH: TSH: 2.371 u[IU]/mL (ref 0.350–4.500)

## 2020-08-20 MED ORDER — POTASSIUM CHLORIDE CRYS ER 20 MEQ PO TBCR
20.0000 meq | EXTENDED_RELEASE_TABLET | Freq: Two times a day (BID) | ORAL | Status: AC
  Administered 2020-08-20 – 2020-08-21 (×4): 20 meq via ORAL
  Filled 2020-08-20 (×6): qty 1

## 2020-08-20 MED ORDER — HALOPERIDOL DECANOATE 100 MG/ML IM SOLN
100.0000 mg | Freq: Once | INTRAMUSCULAR | Status: AC
  Administered 2020-08-20: 100 mg via INTRAMUSCULAR
  Filled 2020-08-20 (×2): qty 1

## 2020-08-20 NOTE — H&P (Signed)
Psychiatric Admission Assessment Adult  Patient Identification: Samantha Ashley MRN:  [id number] Date of Evaluation:  08/20/2020 Chief Complaint:  Schizoaffective disorder, chronic condition with acute exacerbation (HCC) [F25.9] Principal Diagnosis: <principal problem not specified> Diagnosis:  Active Problems:   Schizoaffective disorder, chronic condition with acute exacerbation (HCC)  History of Present Illness: Patient is seen and examined.  Patient is a 25 year old female familiar to me from previous psychiatric admissions with a primary psychiatric diagnosis of schizoaffective disorder; bipolar type.  She originally presented on 08/18/2020 with her mother.  The patient was a poor historian.  [facility], became agitated and on directable.  She ripped banners off the hallway.  She was escorted to the [facility] emergency room to take out involuntary commitment paperwork.  The patient stated that she had last had her medications sometime last fall.  She stated she was unable to remember her psychiatrist nor any of her medications.  She was placed under involuntary commitment and then transferred back to our facility.  Her last psychiatric hospitalization was on 10/28/2018 at our facility.  Her discharge medications at that time included Cogentin, Depakote, Haldol, omega-3 fatty acids, prenatal vitamins, propranolol, Risperdal and trazodone.  On admission last night she was placed on clonidine 0.1 mg p.o. 3 times daily for elevated systolic blood pressures and restarted on her haloperidol 5 mg p.o. or IM daily as well as 10 mg p.o. or IM nightly.  She is irritable this morning and paranoid.  She is requesting discharge.  I told her that we would give her the long-acting decanoate injection, and she is agreed to that today.  Associated Signs/Symptoms: Depression Symptoms:  anhedonia, insomnia, psychomotor agitation, fatigue, difficulty  concentrating, anxiety, loss of energy/fatigue, disturbed sleep, Duration of Depression Symptoms: Greater than two weeks  (Hypo) Manic Symptoms:  Delusions, Impulsivity, Irritable Mood, Labiality of Mood, Anxiety Symptoms:  Excessive Worry, Psychotic Symptoms:  Delusions, Hallucinations: Auditory Paranoia, PTSD Symptoms: Negative Total Time spent with patient: 30 minutes  Past Psychiatric History: Patient has had multiple psychiatric hospitalization in our facility in the past.  Her most recent psychiatric hospitalization in our facility was on 10/28/2018.  Prior to that it was 10/15/18.  Is the patient at risk to self? Yes.    Has the patient been a risk to self in the past 6 months? Yes.    Has the patient been a risk to self within the distant past? Yes.    Is the patient a risk to others? Yes.    Has the patient been a risk to others in the past 6 months? Yes.    Has the patient been a risk to others within the distant past? Yes.     Prior Inpatient Therapy:   Prior Outpatient Therapy:    Alcohol Screening:   Substance Abuse History in the last 12 months:  Yes.   Consequences of Substance Abuse: Negative Previous Psychotropic Medications: Yes  Psychological Evaluations: Yes  Past Medical History:  Past Medical History:  Diagnosis Date  . Asthma    No past surgical history on file. Family History:  Family History  Problem Relation Age of Onset  . Healthy Mother   . Healthy Father    Family Psychiatric  History: Noncontributory Tobacco Screening:   Social History:  Social History   Substance and Sexual Activity  Alcohol Use No     Social History   Substance and Sexual Activity  Drug Use Yes  .  Types: Marijuana    Additional Social History:                           Allergies:   Allergies  Allergen Reactions  . Coconut Oil Itching   Lab Results:  Results for orders placed or performed during the hospital encounter of 08/19/20 (from the  past 48 hour(s))  TSH     Status: None   Collection Time: 08/20/20  6:33 AM  Result Value Ref Range   TSH 2.371 0.350 - 4.500 uIU/mL    Comment: Performed by a 3rd Generation assay with a functional sensitivity of <=0.01 uIU/mL. Performed at [facility], [address] [place], [place] [address]   Lipid panel     Status: Abnormal   Collection Time: 08/20/20  6:33 AM  Result Value Ref Range   Cholesterol 86 0 - 200 mg/dL   Triglycerides 93 <604<150 mg/dL   HDL 23 (L) >54>40 mg/dL   Total CHOL/HDL Ratio 3.7 RATIO   VLDL 19 0 - 40 mg/dL   LDL Cholesterol 44 0 - 99 mg/dL    Comment:        Total Cholesterol/HDL:CHD Risk Coronary Heart Disease Risk Table                     Men   Women  1/2 Average Risk   3.4   3.3  Average Risk       5.0   4.4  2 X Average Risk   9.6   7.1  3 X Average Risk  23.4   11.0        Use the calculated Patient Ratio above and the CHD Risk Table to determine the patient's CHD Risk.        ATP III CLASSIFICATION (LDL):  <100     mg/dL   Optimal  098-119100-129  mg/dL   Near or Above                    Optimal  130-159  mg/dL   Borderline  [id number]  mg/dL   High  >562>190     mg/dL   Very High Performed at [facility], [address] [place], [place] [address]   Hemoglobin A1c     Status: None   Collection Time: 08/20/20  6:33 AM  Result Value Ref Range   Hgb A1c MFr Bld 5.1 4.8 - 5.6 %    Comment: (NOTE) Pre diabetes:          5.7%-6.4%  Diabetes:              >6.4%  Glycemic control for   <7.0% adults with diabetes    Mean Plasma Glucose 99.67 mg/dL    Comment: Performed at [facility], [address] [place], [place] [address]    Blood Alcohol level:  Lab Results  Component Value Date   [facility] <10 10/25/2018   ETH <10 10/10/2018    Metabolic Disorder Labs:  Lab Results  Component Value Date   HGBA1C 5.1 08/20/2020   MPG 99.67 08/20/2020   MPG 93.93 10/11/2018   Lab Results  Component Value Date    PROLACTIN 98.3 (H) 10/29/2018   PROLACTIN 52.5 (H) 10/11/2018   Lab Results  Component Value Date   CHOL 86 08/20/2020   TRIG 93 08/20/2020   HDL 23 (L) 08/20/2020   CHOLHDL 3.7 08/20/2020  VLDL 19 08/20/2020   LDLCALC 44 08/20/2020   LDLCALC 54 10/29/2018    Current Medications: Current Facility-Administered Medications  Medication Dose Route Frequency Provider Last Rate Last Admin  . acetaminophen (TYLENOL) tablet 650 mg  650 mg Oral Q6H PRN Antonieta Pert, MD   650 mg at 08/20/20 0801  . alum & mag hydroxide-simeth (MAALOX/MYLANTA) 200-200-20 MG/5ML suspension 30 mL  30 mL Oral Q4H PRN Antonieta Pert, MD      . cloNIDine (CATAPRES) tablet 0.1 mg  0.1 mg Oral TID PRN Antonieta Pert, MD      . haloperidol (HALDOL) tablet 10 mg  10 mg Oral QHS Antonieta Pert, MD   10 mg at 08/19/20 2047   Or  . haloperidol lactate (HALDOL) injection 10 mg  10 mg Intramuscular QHS Antonieta Pert, MD      . haloperidol (HALDOL) tablet 5 mg  5 mg Oral Daily Antonieta Pert, MD   5 mg at 08/20/20 7867   Or  . haloperidol lactate (HALDOL) injection 5 mg  5 mg Intramuscular Daily Antonieta Pert, MD      . LORazepam (ATIVAN) tablet 1 mg  1 mg Oral Q6H PRN Antonieta Pert, MD   1 mg at 08/20/20 0003   And  . OLANZapine zydis (ZYPREXA) disintegrating tablet 10 mg  10 mg Oral Q8H PRN Antonieta Pert, MD   10 mg at 08/19/20 1829   And  . ziprasidone (GEODON) injection 20 mg  20 mg Intramuscular Q6H PRN Antonieta Pert, MD   20 mg at 08/19/20 1844  . magnesium hydroxide (MILK OF MAGNESIA) suspension 30 mL  30 mL Oral Daily PRN Antonieta Pert, MD      . potassium chloride SA (KLOR-CON) CR tablet 20 mEq  20 mEq Oral BID Antonieta Pert, MD   20 mEq at 08/20/20 1056  . traZODone (DESYREL) tablet 50 mg  50 mg Oral QHS PRN Antonieta Pert, MD   50 mg at 08/19/20 2047   PTA Medications: Medications Prior to Admission  Medication Sig Dispense Refill Last Dose  .  albuterol (VENTOLIN HFA) 108 (90 Base) MCG/ACT inhaler Inhale 1 puff into the lungs every 6 (six) hours as needed for wheezing or shortness of breath.     . ARIPiprazole (ABILIFY) 5 MG tablet Take 5 mg by mouth daily. (Patient not taking: No sig reported)     . benztropine (COGENTIN) 1 MG tablet Take 1 tablet (1 mg total) by mouth 2 (two) times daily. (Patient not taking: No sig reported) 60 tablet 2   . divalproex (DEPAKOTE ER) 500 MG 24 hr tablet 1 in am 2 at h s (Patient not taking: No sig reported) 90 tablet 2   . haloperidol (HALDOL) 5 MG tablet 1 in am 3 at hs (Patient not taking: No sig reported) 120 tablet 2   . metFORMIN (GLUCOPHAGE) 500 MG tablet Take 500 mg by mouth 2 (two) times daily with a meal.     . Norethindrone Acetate-Ethinyl Estradiol (LOESTRIN) 1.5-30 MG-MCG tablet Take 1 tablet by mouth daily.     Marland Kitchen omega-3 acid ethyl esters (LOVAZA) 1 g capsule Take 1 capsule (1 g total) by mouth 2 (two) times daily. (Patient not taking: No sig reported) 60 capsule 2   . ondansetron (ZOFRAN) 4 MG tablet Take 1 tablet (4 mg total) by mouth every 6 (six) hours. (Patient not taking: No sig reported) 12 tablet 0   .  Prenatal Vit-Fe Fumarate-FA (PRENATAL MULTIVITAMIN) TABS tablet Take 1 tablet by mouth daily. (Patient not taking: No sig reported) 90 tablet 2   . propranolol (INDERAL) 40 MG tablet Take 1.5 tablets (60 mg total) by mouth 2 (two) times daily. (Patient not taking: No sig reported) 60 tablet 3   . risperiDONE microspheres (RISPERDAL CONSTA) 50 MG injection Inject 2 mLs (50 mg total) into the muscle every 14 (fourteen) days. Due 7/16 (Patient not taking: No sig reported) 1 each 11   . tetrahydrozoline-zinc (VISINE-AC) 0.05-0.25 % ophthalmic solution Place 1 drop into both eyes 3 (three) times daily as needed (dry eyes).     . traZODone (DESYREL) 100 MG tablet Take 2 tablets (200 mg total) by mouth at bedtime. (Patient not taking: No sig reported) 60 tablet 2     Musculoskeletal: Strength  & Muscle Tone: within normal limits Gait & Station: normal Patient leans: N/A            Psychiatric Specialty Exam:  Presentation  General Appearance: Casual  Eye Contact:Fair  Speech:Normal Rate  Speech Volume:Normal  Handedness:Right   Mood and Affect  Mood:Dysphoric; Irritable  Affect:Congruent   Thought Process  Thought Processes:Goal Directed  Duration of Psychotic Symptoms: Greater than six months  Past Diagnosis of Schizophrenia or Psychoactive disorder: Yes  Descriptions of Associations:Loose  Orientation:Full (Time, Place and Person)  Thought Content:Delusions; Paranoid Ideation  Hallucinations:Hallucinations: Auditory Description of Auditory Hallucinations: reprots she hears her own voice and with racing thoughts  Ideas of Reference:Delusions; Paranoia  Suicidal Thoughts:Suicidal Thoughts: No  Homicidal Thoughts:Homicidal Thoughts: No   Sensorium  Memory:Immediate Fair; Recent Fair; Remote Fair  Judgment:Impaired  Insight:Lacking   Executive Functions  Concentration:Fair  Attention Span:Fair  Recall:Fair  Fund of Knowledge:Fair  Language:Fair   Psychomotor Activity  Psychomotor Activity:Psychomotor Activity: Increased   Assets  Assets:Desire for Improvement; Resilience   Sleep  Sleep:Sleep: Fair Number of Hours of Sleep: 5.5    Physical Exam: Physical Exam Vitals and nursing note reviewed.  Constitutional:      Appearance: Normal appearance.  Pulmonary:     Effort: Pulmonary effort is normal.  Neurological:     General: No focal deficit present.     Mental Status: She is alert and oriented to person, place, and time.    ROS Blood pressure (!) 145/91, pulse (!) 115, temperature 97.6 F (36.4 C), temperature source Oral, resp. rate 18, height  (1.651 m), weight 113.4 kg, SpO2 100 %. Body mass index is 41.6 kg/m.  Treatment Plan Summary: Daily contact with patient to assess and evaluate symptoms  and progress in treatment, Medication management and Plan : Patient is seen and examined.  Patient is a 25 year old female with the above-stated past psychiatric history who was admitted with psychotic symptoms, noncompliance with medication and treatment.  She will be admitted to the hospital.  She will be integrated in the milieu.  She will be encouraged to attend groups.  She has already been restarted on her haloperidol.  She received 5 mg p.o. yesterday and 10 mg p.o. nightly.  We will continue that.  She has agreed to a long-acting injectable decanoate injection.  We will give her 100 mg IM today.  We will get her set up for outpatient follow-up back at [facility] after discharge.  Review of her admission laboratories revealed a mildly low sodium at 133 and a mildly low potassium at 3.1.  This will be supplemented.  Liver function enzymes were normal.  Lipid panel was  normal.  Her white blood cell count was slightly elevated at 12.5.  Rest of her differential was normal.  Beta-hCG was negative.  TSH was 2.371.  Blood sugar was 92.  Respiratory panel was negative for influenza A, B and coronavirus.  Drug screen was positive for marijuana.  EKG was not done.  Her blood pressure remains mildly elevated 145/91 and a pulse of 115.  She was previously on propranolol, and I will review her electronic medical record to make sure that cocaine has not been involved in her past before we put her on a beta-blocker.  Otherwise we will treat her according to guidelines.  Observation Level/Precautions:  15 minute checks  Laboratory:  Chemistry Profile  Psychotherapy:    Medications:    Consultations:    Discharge Concerns:    Estimated LOS:  Other:     Physician Treatment Plan for Primary Diagnosis: <principal problem not specified> Long Term Goal(s): Improvement in symptoms so as ready for discharge  Short Term Goals: Ability to identify changes in lifestyle to reduce recurrence of condition will improve, Ability  to verbalize feelings will improve, Ability to disclose and discuss suicidal ideas, Ability to demonstrate self-control will improve, Ability to identify and develop effective coping behaviors will improve, Ability to maintain clinical measurements within normal limits will improve, Compliance with prescribed medications will improve and Ability to identify triggers associated with substance abuse/mental health issues will improve  Physician Treatment Plan for Secondary Diagnosis: Active Problems:   Schizoaffective disorder, chronic condition with acute exacerbation (HCC)  Long Term Goal(s): Improvement in symptoms so as ready for discharge  Short Term Goals: Ability to identify changes in lifestyle to reduce recurrence of condition will improve, Ability to verbalize feelings will improve, Ability to disclose and discuss suicidal ideas, Ability to demonstrate self-control will improve, Ability to identify and develop effective coping behaviors will improve, Ability to maintain clinical measurements within normal limits will improve, Compliance with prescribed medications will improve and Ability to identify triggers associated with substance abuse/mental health issues will improve  I certify that inpatient services furnished can reasonably be expected to improve the patient's condition.    Antonieta Pert, MD 4/26/20221:03 PM

## 2020-08-20 NOTE — [facility] Suicide Risk Assessment (Signed)
[facility] Admission Suicide Risk Assessment   Nursing information obtained from:  Patient Demographic factors:  Female,Gay, lesbian, or bisexual orientation Current Mental Status:  NA Loss Factors:  NA Historical Factors:  Impulsivity,Family history of mental illness or substance abuse Risk Reduction Factors:  Employed,Living with another person, especially a relative,Positive social support,Positive therapeutic relationship  Total Time spent with patient: 30 minutes Principal Problem: <principal problem not specified> Diagnosis:  Active Problems:   Schizoaffective disorder, chronic condition with acute exacerbation (HCC)  Subjective Data: Patient is seen and examined.  Patient is a 25 year old female familiar to me from previous psychiatric admissions with a primary psychiatric diagnosis of schizoaffective disorder; bipolar type.  She originally presented on 08/18/2020 with her mother.  The patient was a poor historian.  [facility], became agitated and on directable.  She ripped banners off the hallway.  She was escorted to the [facility] emergency room to take out involuntary commitment paperwork.  The patient stated that she had last had her medications sometime last fall.  She stated she was unable to remember her psychiatrist nor any of her medications.  She was placed under involuntary commitment and then transferred back to our facility.  Her last psychiatric hospitalization was on 10/28/2018 at our facility.  Her discharge medications at that time included Cogentin, Depakote, Haldol, omega-3 fatty acids, prenatal vitamins, propranolol, Risperdal and trazodone.  On admission last night she was placed on clonidine 0.1 mg p.o. 3 times daily for elevated systolic blood pressures and restarted on her haloperidol 5 mg p.o. or IM daily as well as 10 mg p.o. or IM nightly.  She is irritable this morning and paranoid.  She is requesting discharge.   I told her that we would give her the long-acting decanoate injection, and she is agreed to that today.  Continued Clinical Symptoms:    The "Alcohol Use Disorders Identification Test", Guidelines for Use in Primary Care, Second Edition.  World Science writer [facility]). Score between 0-7:  no or low risk or alcohol related problems. Score between 8-15:  moderate risk of alcohol related problems. Score between 16-19:  high risk of alcohol related problems. Score 20 or above:  warrants further diagnostic evaluation for alcohol dependence and treatment.   CLINICAL FACTORS:   Schizophrenia:   Less than 43 years old Paranoid or undifferentiated type   Musculoskeletal: Strength & Muscle Tone: within normal limits Gait & Station: normal Patient leans: N/A  Psychiatric Specialty Exam:  Presentation  General Appearance: Casual  Eye Contact:Fair  Speech:Normal Rate  Speech Volume:Normal  Handedness:Right   Mood and Affect  Mood:Dysphoric; Irritable  Affect:Congruent   Thought Process  Thought Processes:Goal Directed  Descriptions of Associations:Loose  Orientation:Full (Time, Place and Person)  Thought Content:Delusions; Paranoid Ideation  History of Schizophrenia/Schizoaffective disorder:Yes  Duration of Psychotic Symptoms:Greater than six months  Hallucinations:Hallucinations: Auditory Description of Auditory Hallucinations: reprots she hears her own voice and with racing thoughts  Ideas of Reference:Delusions; Paranoia  Suicidal Thoughts:Suicidal Thoughts: No  Homicidal Thoughts:Homicidal Thoughts: No   Sensorium  Memory:Immediate Fair; Recent Fair; Remote Fair  Judgment:Impaired  Insight:Lacking   Executive Functions  Concentration:Fair  Attention Span:Fair  Recall:Fair  Fund of Knowledge:Fair  Language:Fair   Psychomotor Activity  Psychomotor Activity:Psychomotor Activity: Increased   Assets  Assets:Desire for Improvement;  Resilience   Sleep  Sleep:Sleep: Fair Number of Hours of Sleep: 5.5    Physical Exam: Physical Exam Vitals and nursing note reviewed.  Constitutional:      Appearance: She is obese.  HENT:     Head: Normocephalic and atraumatic.  Pulmonary:     Effort: Pulmonary effort is normal.  Neurological:     General: No focal deficit present.     Mental Status: She is alert and oriented to person, place, and time.    ROS Blood pressure (!) 145/91, pulse (!) 115, temperature 97.6 F (36.4 C), temperature source Oral, resp. rate 18, height 5\' 5"  (1.651 m), weight 113.4 kg, SpO2 100 %. Body mass index is 41.6 kg/m.   COGNITIVE FEATURES THAT CONTRIBUTE TO RISK:  Thought constriction (tunnel vision)    SUICIDE RISK:   Mild:  Suicidal ideation of limited frequency, intensity, duration, and specificity.  There are no identifiable plans, no associated intent, mild dysphoria and related symptoms, good self-control (both objective and subjective assessment), few other risk factors, and identifiable protective factors, including available and accessible social support.  PLAN OF CARE: Patient is seen and examined.  Patient is a 25 year old female with the above-stated past psychiatric history who was admitted with psychotic symptoms, noncompliance with medication and treatment.  She will be admitted to the hospital.  She will be integrated in the milieu.  She will be encouraged to attend groups.  She has already been restarted on her haloperidol.  She received 5 mg p.o. yesterday and 10 mg p.o. nightly.  We will continue that.  She has agreed to a long-acting injectable decanoate injection.  We will give her 100 mg IM today.  We will get her set up for outpatient follow-up back at [facility] after discharge.  Review of her admission laboratories revealed a mildly low sodium at 133 and a mildly low potassium at 3.1.  This will be supplemented.  Liver function enzymes were normal.  Lipid panel was normal.   Her white blood cell count was slightly elevated at 12.5.  Rest of her differential was normal.  Beta-hCG was negative.  TSH was 2.371.  Blood sugar was 92.  Respiratory panel was negative for influenza A, B and coronavirus.  Drug screen was positive for marijuana.  EKG was not done.  Her blood pressure remains mildly elevated 145/91 and a pulse of 115.  She was previously on propranolol, and I will review her electronic medical record to make sure that cocaine has not been involved in her past before we put her on a beta-blocker.  Otherwise we will treat her according to guidelines.  I certify that inpatient services furnished can reasonably be expected to improve the patient's condition.   [facility], MD 08/20/2020, 8:07 AM
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
## 2020-08-20 NOTE — Progress Notes (Signed)
Recreation Therapy Notes  Date: 4.26.22 Time: 1000 Location: 500 Hall Dayroom  Group Topic: Wellness  Goal Area(s) Addresses:  Patient will define components of whole wellness. Patient will verbalize benefit of whole wellness.  Behavioral Response: Engaged  Intervention: Exercise  Activity:  Patients participated in a range of exercises.  Each patient had the opportunity to pick stretches and exercises of their choosing to lead the group in.  The goal was to get at least 30 minutes of active movement to loosen the muscles, clear the mind and let go of any frustrations.  After exercising patients were given two worksheets.  One worksheet gave examples of some things patients can do to help with total wellbeing such as eating right, taking a walk, getting proper sleep, etc.  The other sheet taught patients about the different types of exercise (anaerobic and aerobic).  It also left space for patients to plan out when they can exercise and the types of exercise they want to do.    Education: Wellness, Building control surveyor.   Education Outcome: Acknowledges education/In group clarification offered/Needs additional education.   Clinical Observations/Feedback:  Pt was bright and very engaging with the group.  Pt was able to lead group in deep breathing, neck stretches and other different exercises.  Pt expressed the exercises helped to fill better.  Pt also explained that when one area of the body (mental, physical and spiritual) is out of wack, it causes the rest of the body to be out of wack because all of these areas work together to help make the individual their complete self.    Caroll Rancher, LRT/CTRS    Caroll Rancher A 08/20/2020 10:45 AM

## 2020-08-20 NOTE — Progress Notes (Signed)
Recreation Therapy Notes  INPATIENT RECREATION THERAPY ASSESSMENT  Patient Details Name: Samantha Ashley MRN: [id number] DOB: 04/08/1996 Today's Date: 08/20/2020       Information Obtained From: Patient  Able to Participate in Assessment/Interview: Yes  Patient Presentation: Alert Samantha Ashley)  Reason for Admission (Per Patient): Other (Comments) (Pt stated she triggers people.  Pt also stated she told her fiance, "Samantha Ashley was ignored".  Pt explained Samantha Ashley had mental health problems.)  Patient Stressors:  (None)  Coping Skills:   Isolation,Journal,TV,Arguments,Aggression,Music,Deep Ecologist  Leisure Interests (2+):  Individual - Reading,Individual - Writing,Music - Singing,Art - Draw,Art - Paint  Frequency of Recreation/Participation: Other (Comment) (Daily)  Awareness of Community Resources:  Yes  Community Resources:  Product manager (Comment) Careers information officer)  Current Use: Yes  If no, Barriers?:    Expressed Interest in [employer] Information: No  County of Residence:  [place]  Patient Main Form of Transportation: Set designer  Patient Strengths:  Resillience; Mental Health  Patient Identified Areas of Improvement:  Anger Management, Being her true authentic self  Patient Goal for Hospitalization:  "be the light"  Current SI (including self-harm):  No  Current HI:  No  Current AVH: No  Staff Intervention Plan: Group Attendance,Collaborate with Interdisciplinary Treatment Team  Consent to Intern Participation: N/A    Caroll Rancher, LRT/CTRS   Caroll Rancher A 08/20/2020, 11:51 AM

## 2020-08-20 NOTE — Progress Notes (Addendum)
   08/20/20 2204  Psych Admission Type (Psych Patients Only)  Admission Status Involuntary  Psychosocial Assessment  Patient Complaints Anxiety  Eye Contact Brief  Facial Expression Animated  Affect Euphoric;Anxious  Speech Logical/coherent  Therapist, sports Cooperative  Mood Pleasant  Thought Process  Coherency Circumstantial  Content WDL  Delusions None reported or observed  Hallucination None reported or observed  Judgment Poor  Confusion None  Danger to Self  Current suicidal ideation? Denies  Danger to Others  Danger to Others None reported or observed   Pt denies SI, HI, AVH. Rates pain as 7/10 as generalized body aches. Pt is pleasant. Rates anxiety 10/10. Pt says she is ready to go home. States that she slept most of the day today.

## 2020-08-20 NOTE — [facility] Group Notes (Signed)
[facility] Group Notes:  (Nursing/MHT/Case Management/Adjunct)  Date:  08/20/2020  Time:  8:36 AM  Type of Therapy:  Group Therapy  Participation Level:  Active  Participation Quality:  Appropriate  Affect:  Appropriate  Cognitive:  Appropriate  Insight:  Good  Engagement in Group:  Engaged  Modes of Intervention:  Orientation  Summary of Progress/Problems: Her goal for today is figure out when she is able to go home because she has a wedding to plan for.   Bao Bazen J Elfreda Blanchet 08/20/2020, 8:36 AM

## 2020-08-20 NOTE — Progress Notes (Addendum)
   08/20/20 0615  Vital Signs  Temp 97.6 F (36.4 C)  Temp Source Oral  Pulse Rate 100  Pulse Rate Source Monitor  BP (!) 129/92  BP Location Right Arm  BP Method Automatic  Patient Position (if appropriate) Sitting  Oxygen Therapy  SpO2 100 %   D: Patient denies SI/HI/AVH. Patient denies anxiety and depression. Pt. Out in open areas and was social with staff and peers.  A:  Patient took scheduled medicine.Pt.'s general pain was relieved by 650 mg of Tylenol. Support and encouragement provided Routine safety checks conducted every 15 minutes. Patient  Informed to notify staff with any concerns.  R: Safety maintained.  @1254  Pt. Approached the nurses station and complained that she was "pissed off" and "I want to go home." pt. Raised voice to did not yell. Pt was listened to and was offered medicine or any intervention that would help pt. Patient declined and walked back down the hall.

## 2020-08-21 MED ORDER — HALOPERIDOL LACTATE 5 MG/ML IJ SOLN
15.0000 mg | Freq: Every day | INTRAMUSCULAR | Status: DC
  Filled 2020-08-21 (×3): qty 3

## 2020-08-21 MED ORDER — TRAZODONE HCL 100 MG PO TABS
200.0000 mg | ORAL_TABLET | Freq: Every day | ORAL | Status: DC
  Administered 2020-08-21 – 2020-08-25 (×5): 200 mg via ORAL
  Filled 2020-08-21 (×8): qty 2

## 2020-08-21 MED ORDER — WHITE PETROLATUM EX OINT
TOPICAL_OINTMENT | CUTANEOUS | Status: AC
  Administered 2020-08-21: 1
  Filled 2020-08-21: qty 5

## 2020-08-21 MED ORDER — BENZTROPINE MESYLATE 1 MG PO TABS: 1.0000 mg | ORAL_TABLET | Freq: Two times a day (BID) | ORAL | Status: DC | PRN

## 2020-08-21 MED ORDER — DIVALPROEX SODIUM ER 500 MG PO TB24
500.0000 mg | ORAL_TABLET | Freq: Every evening | ORAL | Status: DC
  Administered 2020-08-21: 500 mg via ORAL
  Filled 2020-08-21 (×3): qty 1

## 2020-08-21 MED ORDER — CLONIDINE HCL 0.1 MG PO TABS: 0.1000 mg | ORAL_TABLET | Freq: Three times a day (TID) | ORAL | Status: DC | PRN

## 2020-08-21 MED ORDER — DIVALPROEX SODIUM 500 MG PO DR TAB: 500.0000 mg | DELAYED_RELEASE_TABLET | Freq: Two times a day (BID) | ORAL | Status: DC

## 2020-08-21 MED ORDER — HALOPERIDOL 5 MG PO TABS
5.0000 mg | ORAL_TABLET | Freq: Every day | ORAL | Status: DC
  Administered 2020-08-21 – 2020-08-23 (×3): 5 mg via ORAL
  Filled 2020-08-21 (×4): qty 1

## 2020-08-21 MED ORDER — RISPERIDONE 1 MG PO TBDP
3.0000 mg | ORAL_TABLET | Freq: Two times a day (BID) | ORAL | Status: DC
  Filled 2020-08-21 (×3): qty 3

## 2020-08-21 MED ORDER — HALOPERIDOL LACTATE 5 MG/ML IJ SOLN
5.0000 mg | Freq: Every day | INTRAMUSCULAR | Status: DC
  Filled 2020-08-21 (×4): qty 1

## 2020-08-21 MED ORDER — HALOPERIDOL 5 MG PO TABS
15.0000 mg | ORAL_TABLET | Freq: Every day | ORAL | Status: DC
  Administered 2020-08-21 – 2020-08-22 (×2): 15 mg via ORAL
  Filled 2020-08-21 (×3): qty 3

## 2020-08-21 NOTE — Progress Notes (Addendum)
[facility] MD Progress Note  08/22/2020 5:38 PM Samantha Ashley  MRN:  [id number]   Chief Complaint:   Subjective:  Samantha Ashley is a 25 y.o. female with a history of schizoaffective d/o bipolar type, who was initially admitted for inpatient psychiatric hospitalization on 08/19/2020 for management of aggression, pressured speech, delusions, and AH. The patient is currently on Hospital Day 3.   Chart Review from last 24 hours:  The patient's chart was reviewed and nursing notes were reviewed. The patient's case was discussed in multidisciplinary team meeting. Per nursing, patient had no safety or behavioral concerns noted.  Per [facility] she was compliant with scheduled medications. She received Ativan po X1 at 0200 overnight for agitation and Benadryl X1 at 0125. It appears she also got an additional dose of Haldol 5mg  po yesterday morning.   Information Obtained Today During Patient Interview: The patient was seen and evaluated on the unit. On assessment today the patient reports that she recognizes that she is still somewhat manic, but she is wanting to go home. We discussed that she is not sleeping, and she perceives she is sleeping well despite recorded sleep time of 3.75 hours. She is irritable and admits she feels frustrated that she is still in the hospital and under IVC. She denies AVH, paranoia, ideas of reference, or first rank symptoms. She then makes a somatic statement that she believes her "tattoos feel raised" to the touch since she got her Haldol dec shot. She proceeds to show examiner her multiple tattoos and states she can feel the ink more prominently in her tattoos since she started Haldol which she perceives to be an allergic reaction. She denies itching, throat closing, tongue/facial swelling, hives or other rash. She denies SI or HI. She states her appetite is good and she voices no other physical complaints. 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 Time was spent providing supportive therapy and discussing the IVC process. I advised that she may need dose increase in her VPA and she has labs due for levels tomorrow.   After the interview, she got into a loud conversation on the phone and then had a verbal outburst with agitation, crying, and pacing in the hall. She made frequent statements to staff that she is "manic but not crazy." She required po Ativan X1 and Zyprexa zydis X1 and redirection by staff for incident.   Principal Problem: Schizoaffective disorder, bipolar type (HCC) Diagnosis: Principal Problem:   Schizoaffective disorder, bipolar type (HCC)  Total Time Spent in Direct Patient Care:  I personally spent 35 minutes on the unit in direct patient care. The direct patient care time included face-to-face time with the patient, reviewing the patient's chart, communicating with other professionals, and coordinating care. Greater than 50% of this time was spent in counseling or coordinating care with the patient regarding goals of hospitalization, psycho-education, and discharge planning needs.  Past Psychiatric History: see admission H&P  Past Medical History:  Past Medical History:  Diagnosis Date  . Asthma    No past surgical history on file.   Family History:  Family History  Problem Relation Age of Onset  . Healthy Mother   . Healthy Father    Family Psychiatric  History: see admission H&P  Social History:  Social History   Substance and Sexual Activity  Alcohol Use No     Social History   Substance  and Sexual Activity  Drug Use Yes  . Types: Marijuana    Social History   Socioeconomic History  . Marital status: Single    Spouse name: Not on file  . Number of children: Not on file  . Years of education: Not on file  . Highest education level: Not on file  Occupational History  . Not on file  Tobacco Use  . Smoking status: Current Every Day Smoker     Types: Cigars  . Smokeless tobacco: Never Used  Substance and Sexual Activity  . Alcohol use: No  . Drug use: Yes    Types: Marijuana  . Sexual activity: Yes  Other Topics Concern  . Not on file  Social History Narrative  . Not on file   Social Determinants of Health   Financial Resource Strain: Not on file  Food Insecurity: Not on file  Transportation Needs: Not on file  Physical Activity: Not on file  Stress: Not on file  Social Connections: Not on file   Sleep: Poor  Appetite:  Good  Current Medications: Current Facility-Administered Medications  Medication Dose Route Frequency Provider Last Rate Last Admin  . acetaminophen (TYLENOL) tablet 650 mg  650 mg Oral Q6H PRN Antonieta Pert, MD   650 mg at 08/22/20 0013  . alum & mag hydroxide-simeth (MAALOX/MYLANTA) 200-200-20 MG/5ML suspension 30 mL  30 mL Oral Q4H PRN Antonieta Pert, MD      . benztropine (COGENTIN) tablet 1 mg  1 mg Oral BID PRN Mason Jim, Shonda Mandarino E, MD      . cloNIDine (CATAPRES) tablet 0.1 mg  0.1 mg Oral Q8H PRN Mason Jim, Nehal Witting E, MD      . diphenhydrAMINE (BENADRYL) capsule 50 mg  50 mg Oral QHS Welford Christmas E, MD      . divalproex (DEPAKOTE ER) 24 hr tablet 500 mg  500 mg Oral QPM Antonieta Pert, MD   500 mg at 08/21/20 1645  . haloperidol (HALDOL) tablet 15 mg  15 mg Oral QHS Antonieta Pert, MD   15 mg at 08/21/20 2047   Or  . haloperidol lactate (HALDOL) injection 15 mg  15 mg Intramuscular QHS Antonieta Pert, MD      . haloperidol (HALDOL) tablet 5 mg  5 mg Oral Daily Antonieta Pert, MD   5 mg at 08/22/20 7026   Or  . haloperidol lactate (HALDOL) injection 5 mg  5 mg Intramuscular Daily Antonieta Pert, MD      . hydrOXYzine (ATARAX/VISTARIL) tablet 25 mg  25 mg Oral Q6H PRN Mason Jim, Romelle Muldoon E, MD      . LORazepam (ATIVAN) tablet 1 mg  1 mg Oral Q6H PRN Antonieta Pert, MD   1 mg at 08/22/20 1140   And  . OLANZapine zydis (ZYPREXA) disintegrating tablet 10 mg  10 mg Oral Q8H  PRN Antonieta Pert, MD   10 mg at 08/22/20 1114   And  . ziprasidone (GEODON) injection 20 mg  20 mg Intramuscular Q6H PRN Antonieta Pert, MD   20 mg at 08/19/20 1844  . magnesium hydroxide (MILK OF MAGNESIA) suspension 30 mL  30 mL Oral Daily PRN Antonieta Pert, MD      . traZODone (DESYREL) tablet 200 mg  200 mg Oral QHS Antonieta Pert, MD   200 mg at 08/21/20 2046    Lab Results:  Results for orders placed or performed during the hospital encounter of 08/19/20 (from the past  48 hour(s))  Basic metabolic panel     Status: Abnormal   Collection Time: 08/22/20  6:25 AM  Result Value Ref Range   Sodium 140 135 - 145 mmol/L   Potassium 3.8 3.5 - 5.1 mmol/L   Chloride 109 98 - 111 mmol/L   CO2 21 (L) 22 - 32 mmol/L   Glucose, Bld 97 70 - 99 mg/dL    Comment: Glucose reference range applies only to samples taken after fasting for at least 8 hours.   BUN 5 (L) 6 - 20 mg/dL   Creatinine, Ser 2.63 0.44 - 1.00 mg/dL   Calcium 9.1 8.9 - 78.5 mg/dL   GFR, Estimated >88 >50 mL/min    Comment: (NOTE) Calculated using the CKD-EPI Creatinine Equation (2021)    Anion gap 10 5 - 15    Comment: Performed at [facility], [address] [place], [place] [address]    Blood Alcohol level:  Lab Results  Component Value Date   ETH <10 10/25/2018   ETH <10 10/10/2018    Metabolic Disorder Labs: Lab Results  Component Value Date   HGBA1C 5.1 08/20/2020   MPG 99.67 08/20/2020   MPG 93.93 10/11/2018   Lab Results  Component Value Date   PROLACTIN 98.3 (H) 10/29/2018   PROLACTIN 52.5 (H) 10/11/2018   Lab Results  Component Value Date   CHOL 86 08/20/2020   TRIG 93 08/20/2020   HDL 23 (L) 08/20/2020   CHOLHDL 3.7 08/20/2020   VLDL 19 08/20/2020   LDLCALC 44 08/20/2020   LDLCALC 54 10/29/2018   Musculoskeletal: Strength & Muscle Tone: within normal limits Gait & Station: normal, steady Patient leans: N/A  Psychiatric Specialty Exam: Physical  Exam Vitals reviewed.  HENT:     Head: Normocephalic.  Pulmonary:     Effort: Pulmonary effort is normal.  Neurological:     Mental Status: She is alert.     Review of Systems  Respiratory: Negative for shortness of breath.   Cardiovascular: Negative for chest pain.  Gastrointestinal: Negative for nausea and vomiting.     Blood pressure (!) 119/59, pulse (!) 107, temperature 97.8 F (36.6 C), temperature source Oral, resp. rate 20, height 5\' 5"  (1.651 m), weight 113.4 kg, SpO2 100 %.Body mass index is 41.6 kg/m.  General Appearance: fair hygiene, casually dressed  Eye Contact:  Good  Speech:  Clear and Coherent and rapid but not pressured  Volume:  Increased  Mood:  Angry and Irritable  Affect:  Congruent  Thought Process:  Circumstantial and at time tangential  Orientation:  Full (Time, Place, and Person)  Thought Content:  Denies AVH, paranoia, ideas of reference, or first rank symptoms but has somatic delusion that her haldol has caused a change to the texture of her tattoos  Suicidal Thoughts:  No  Homicidal Thoughts:  No  Memory:  Recent;   Fair  Judgement:  Fair  Insight:  Fair  Psychomotor Activity:  Increased - animated when she speaks but AIMS 0 - no cogwheeling, no tremors, no stiffness  Concentration:  Concentration: Poor  Recall:  of Knowledge:  Fair  Language:  Good  Akathisia:  Negative  AIMS (if indicated):   0  Assets:  Communication Skills Desire for Improvement Housing Resilience Social Support  ADL's:  Intact  Cognition:  WNL  Sleep:  Number of Hours: 3.75   Treatment Plan Summary: Diagnoses / Active Problems: Schizoaffective d/o bipolar type by hx HTN Cannabis use -  episodic  PLAN: 1. Safety and Monitoring:  -- Involuntary admission to inpatient psychiatric unit for safety, stabilization and treatment  -- Daily contact with patient to assess and evaluate symptoms and progress in treatment  -- Patient's case to be discussed in  multi-disciplinary team meeting  -- Observation Level : q15 minute checks  -- Vital signs:  q12 hours  -- Precautions: suicide  2. Psychiatric Diagnoses and Treatment:   Schizoaffective d/o bipolar type by hx  -- Received Haldol dec 100mg  IM on 08/20/20 -- Continue Haldol 5mg  po daily and 15mg  po qhs for psychosis as oral bridging as LAI becomes therapeutic -- Continue Trazodone 200mg  qhs for sleep and add Benadryl 50mg  po qhs for sleep -- Cogentin 1mg  bid PRN EPS/tremor -- Continue VPA 500mg  po and move to qhs dosing (checking CBC, LFT, VPA level on Friday morning) -- Continue Zyprexa zydis agitation protocol -- Vistaril 25mg  q6 hours PRN anxiety  -- Metabolic profile and EKG monitoring obtained while on an atypical antipsychotic (BMI:41.60; Lipid Panel:cholesterol 86, triglycerides 93, HDL 23, LDL 44; HbgA1c:5.1; ONG:EXBMWUXQTc:pending)   -- Encouraged patient to participate in unit milieu and in scheduled group therapies   -- Short Term Goals: Ability to demonstrate self-control will improve and Ability to identify and develop effective coping behaviors will improve  -- Long Term Goals: Improvement in symptoms so as ready for discharge   High Risk Medication Use: -- The complexity of this patient's case involves drug therapy with Depakote which requires intensive monitoring for toxicity. This is in part due to a narrow therapeutic window as well as potential for toxicity with this medication.    Cannabis use - episodic  -- UDS positive for THC  -- Counseled on need to abstain from illicit substances   3. Medical Issues Being Addressed:   HTN  -- Continue clonidine 0.1mg  q8 hours PRN SBP >150 or DBP >100    Hypokalemia - resolved  -- Repeat BMP 08/22/20: shows K+ 3.8   Hyponatremia - resolved  -- Repeat BMP 08/22/20: shows Na+ 140   Elevated Creatinine (1.22) - resolved  -- Repeat BMP 08/22/20: shows creatinine 0.78 with BUN 5  -- po hydration encouraged   Leukocytosis on admission (WBC  12.5)  -- Repeating CBC on Friday for monitoring  4. Discharge Planning:   -- Social work and case management to assist with discharge planning and identification of hospital follow-up needs prior to discharge  -- Estimated LOS: 3-4 days  -- Discharge Concerns: Need to establish a safety plan; Medication compliance and effectiveness  -- Discharge Goals: Return home with outpatient referrals for mental health follow-up including medication management/psychotherapy  Comer LocketAmy E Arcenio Mullaly, MD, FAPA 08/22/2020, 5:38 PM

## 2020-08-21 NOTE — Progress Notes (Signed)
Recreation Therapy Notes  Date: 4.27.22 Time: 1000 Location: 500 Hall Dayroom  Group Topic: Self-Esteem  Goal Area(s) Addresses:  Patient will successfully identify positive attributes about themselves.  Patient will successfully identify benefit of improved self-esteem.   Behavioral Response: Engaged  Intervention: Nurse, learning disability; Markers  Activity: The Mask.  LRT and patients discussed the things that influence the way we see ourselves.  LRT brought up the fact that we as people are quick to name the negative things about ourselves and are unable to name the positive.  Some of this comes from the environment around [place] forcing their ideas of beauty and what is acceptable into our subconscious and we take on those thoughts without realizing it.  Patients were given a blank mask and markers.  Patients were to highlight the positive attributes they have that are often hidden behind the mask.  Patients would then share what they were able to bring out about themselves.  Education:  Self-Esteem, Discharge Planning  Education Outcome: Acknowledges education/In group clarification offered/Needs additional education  Clinical Observations/Feedback: Patient was active and engaged in group.  Pt expressed self esteem is influenced by the media and what they feel is acceptable.  Pt explained this could be countered by speaking positive to yourself.  Pt described herself as happy/bubbly/friendly; kind hearted/loving/beautiful soul; optimistic/kid at heart/ and an auntie. Pt was bright and active during group session.    Caroll Rancher, LRT/CTRS    Caroll Rancher A 08/21/2020 11:19 AM

## 2020-08-21 NOTE — Progress Notes (Signed)
[facility] MD Progress Note  08/21/2020 10:25 AM Samantha Ashley  MRN:  [id number] Subjective: Patient is a 25 year old female familiar to me from previous psychiatric admissions with a primary psychiatric diagnosis of schizoaffective disorder; bipolar type.  On admission on 4/25 she was agitated, manic, psychotic.  Objective: Patient is seen and examined.  Patient is a 25 year old female with the above-stated past psychiatric history who is seen in follow-up.  Yesterday she received the long-acting Haldol injection has been compliant with her oral medications.  Her behavior has been appropriate throughout.  She denied auditory or visual hallucinations.  She denied suicidal or homicidal ideation.  We discussed the long-acting Haldol injection long-term.  Initially her heart rate was 79, but went up to 135.  The rest of her vital signs are stable.  Her pulse oximetry was 100% on room air.  Her sodium from 4/24 was 133, potassium was 3.1.  She was supplemented with that.  Creatinine on 4/24 was elevated at 1.22.  Liver function enzymes were normal.  Lipid panel was normal.  CBC showed a mild elevation of her white blood cell count at 12.5.  The rest of her differential was essentially normal.  Hemoglobin A1c was 5.1.  TSH was 2.371.  Respiratory panel was negative.  Drug screen was positive for marijuana.  Pregnancy test was negative.  We do not have an EKG on her.  Principal Problem: <principal problem not specified> Diagnosis: Active Problems:   Schizoaffective disorder, chronic condition with acute exacerbation (HCC)  Total Time spent with patient: 20 minutes  Past Psychiatric History: See admission H&P  Past Medical History:  Past Medical History:  Diagnosis Date  . Asthma    No past surgical history on file. Family History:  Family History  Problem Relation Age of Onset  . Healthy Mother   . Healthy Father    Family Psychiatric  History: See admission H&P Social History:  Social History    Substance and Sexual Activity  Alcohol Use No     Social History   Substance and Sexual Activity  Drug Use Yes  . Types: Marijuana    Social History   Socioeconomic History  . Marital status: Single    Spouse name: Not on file  . Number of children: Not on file  . Years of education: Not on file  . Highest education level: Not on file  Occupational History  . Not on file  Tobacco Use  . Smoking status: Current Every Day Smoker    Types: Cigars  . Smokeless tobacco: Never Used  Substance and Sexual Activity  . Alcohol use: No  . Drug use: Yes    Types: Marijuana  . Sexual activity: Yes  Other Topics Concern  . Not on file  Social History Narrative  . Not on file   Social Determinants of Health   Financial Resource Strain: Not on file  Food Insecurity: Not on file  Transportation Needs: Not on file  Physical Activity: Not on file  Stress: Not on file  Social Connections: Not on file   Additional Social History:                         Sleep: Poor  Appetite:  Fair  Current Medications: Current Facility-Administered Medications  Medication Dose Route Frequency Provider Last Rate Last Admin  . acetaminophen (TYLENOL) tablet 650 mg  650 mg Oral Q6H PRN Antonieta Pert, MD   650 mg at 08/20/20 2030  .  alum & mag hydroxide-simeth (MAALOX/MYLANTA) 200-200-20 MG/5ML suspension 30 mL  30 mL Oral Q4H PRN Antonieta Pert, MD      . cloNIDine (CATAPRES) tablet 0.1 mg  0.1 mg Oral TID PRN Antonieta Pert, MD      . haloperidol (HALDOL) tablet 15 mg  15 mg Oral QHS Antonieta Pert, MD       Or  . haloperidol lactate (HALDOL) injection 15 mg  15 mg Intramuscular QHS Antonieta Pert, MD      . LORazepam (ATIVAN) tablet 1 mg  1 mg Oral Q6H PRN Antonieta Pert, MD   1 mg at 08/21/20 0236   And  . OLANZapine zydis (ZYPREXA) disintegrating tablet 10 mg  10 mg Oral Q8H PRN Antonieta Pert, MD   10 mg at 08/20/20 1431   And  . ziprasidone  (GEODON) injection 20 mg  20 mg Intramuscular Q6H PRN Antonieta Pert, MD   20 mg at 08/19/20 1844  . magnesium hydroxide (MILK OF MAGNESIA) suspension 30 mL  30 mL Oral Daily PRN Antonieta Pert, MD      . potassium chloride SA (KLOR-CON) CR tablet 20 mEq  20 mEq Oral BID Antonieta Pert, MD   20 mEq at 08/21/20 0801  . risperiDONE (RISPERDAL M-TABS) disintegrating tablet 3 mg  3 mg Oral BID Antonieta Pert, MD      . traZODone (DESYREL) tablet 50 mg  50 mg Oral QHS PRN Antonieta Pert, MD   50 mg at 08/20/20 2209    Lab Results:  Results for orders placed or performed during the hospital encounter of 08/19/20 (from the past 48 hour(s))  TSH     Status: None   Collection Time: 08/20/20  6:33 AM  Result Value Ref Range   TSH 2.371 0.350 - 4.500 uIU/mL    Comment: Performed by a 3rd Generation assay with a functional sensitivity of <=0.01 uIU/mL. Performed at [facility], [address], [place], [place] [address]   Lipid panel     Status: Abnormal   Collection Time: 08/20/20  6:33 AM  Result Value Ref Range   Cholesterol 86 0 - 200 mg/dL   Triglycerides 93 <017 mg/dL   HDL 23 (L) >51 mg/dL   Total CHOL/HDL Ratio 3.7 RATIO   VLDL 19 0 - 40 mg/dL   LDL Cholesterol 44 0 - 99 mg/dL    Comment:        Total Cholesterol/HDL:CHD Risk Coronary Heart Disease Risk Table                     Men   Women  1/2 Average Risk   3.4   3.3  Average Risk       5.0   4.4  2 X Average Risk   9.6   7.1  3 X Average Risk  23.4   11.0        Use the calculated Patient Ratio above and the CHD Risk Table to determine the patient's CHD Risk.        ATP III CLASSIFICATION (LDL):  <100     mg/dL   Optimal  025-852  mg/dL   Near or Above                    Optimal  130-159  mg/dL   Borderline  778-242  mg/dL   High  >353     mg/dL  Very High Performed at [facility], [address], [place], [place] [address]   Hemoglobin A1c     Status: None    Collection Time: 08/20/20  6:33 AM  Result Value Ref Range   Hgb A1c MFr Bld 5.1 4.8 - 5.6 %    Comment: (NOTE) Pre diabetes:          5.7%-6.4%  Diabetes:              >6.4%  Glycemic control for   <7.0% adults with diabetes    Mean Plasma Glucose 99.67 mg/dL    Comment: Performed at [facility], [address], [place], [place] [address]    Blood Alcohol level:  Lab Results  Component Value Date   [facility] <10 10/25/2018   ETH <10 10/10/2018    Metabolic Disorder Labs: Lab Results  Component Value Date   HGBA1C 5.1 08/20/2020   MPG 99.67 08/20/2020   MPG 93.93 10/11/2018   Lab Results  Component Value Date   PROLACTIN 98.3 (H) 10/29/2018   PROLACTIN 52.5 (H) 10/11/2018   Lab Results  Component Value Date   CHOL 86 08/20/2020   TRIG 93 08/20/2020   HDL 23 (L) 08/20/2020   CHOLHDL 3.7 08/20/2020   VLDL 19 08/20/2020   LDLCALC 44 08/20/2020   LDLCALC 54 10/29/2018    Physical Findings: AIMS:  , ,  ,  ,    CIWA:    COWS:     Musculoskeletal: Strength & Muscle Tone: within normal limits Gait & Station: normal Patient leans: N/A  Psychiatric Specialty Exam:  Presentation  General Appearance: Casual  Eye Contact:Fair  Speech:Normal Rate  Speech Volume:Normal  Handedness:Right   Mood and Affect  Mood:Anxious  Affect:Congruent   Thought Process  Thought Processes:Goal Directed  Descriptions of Associations:Circumstantial  Orientation:Full (Time, Place and Person)  Thought Content:Delusions; Paranoid Ideation  History of Schizophrenia/Schizoaffective disorder:Yes  Duration of Psychotic Symptoms:Greater than six months  Hallucinations:Hallucinations: None  Ideas of Reference:Paranoia; Delusions  Suicidal Thoughts:Suicidal Thoughts: No  Homicidal Thoughts:Homicidal Thoughts: No   Sensorium  Memory:Immediate Fair; Recent Fair; Remote Fair  Judgment:Fair  Insight:Fair   Executive Functions  Concentration:Fair  Attention  Span:Fair  Recall:Fair  Fund of Knowledge:Fair  Language:Fair   Psychomotor Activity  Psychomotor Activity:Psychomotor Activity: Normal   Assets  Assets:Desire for Improvement; Resilience; Social Support   Sleep  Sleep:Sleep: Poor Number of Hours of Sleep: 2.5    Physical Exam: Physical Exam ROS Blood pressure 116/85, pulse (!) 135, temperature 98 F (36.7 C), temperature source Oral, resp. rate 20, height 5\' 5"  (1.651 m), weight 113.4 kg, SpO2 100 %. Body mass index is 41.6 kg/m.   Treatment Plan Summary: Daily contact with patient to assess and evaluate symptoms and progress in treatment, Medication management and Plan : Patient is seen and examined.  Patient is a 25 year old female with the above-stated past psychiatric history who is seen in follow-up.   Diagnosis: 1.  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 2.  Hypertension  Pertinent findings on examination today: 1.  Mood is more stable, less paranoid, no auditory or visual loose [place]. 2.  No suicidal or homicidal ideation. 3.  Patient sleep remains poor. 4.  Patient is amendable to continuing the long-acting Haldol injection.  Plan: 1.  Continue clonidine 0.1 mg p.o. 3 times daily as needed a systolic blood pressure greater than 150. 2.  Obtain EKG today. 3.  Continue haloperidol 5 mg p.o. daily and  15 mg p.o. nightly for psychosis. 4.  Patient received the long-acting Haldol Decanoate injection on 08/20/2020 100 mg IM x1.  This is for psychosis. 5.  Continue potassium chloride supplementation secondary to hypokalemia. 6.  Riyad trazodone 200 mg p.o. nightly for insomnia. 7.  Disposition planning-in progress.  Antonieta PertGreg Lawson Jahnae Mcadoo, MD 08/21/2020, 10:25 AM

## 2020-08-21 NOTE — Progress Notes (Signed)
Pt states that she cannot go to sleep. She says she is tired but her mind is too full of happiness to sleep. Pt given Trazodone 50 mg.
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
## 2020-08-21 NOTE — [facility] Group Notes (Signed)
LCSW Group Therapy Note   08/21/2020 1:15pm   Type of Therapy and Topic:  Group Therapy:  Positive Affirmations   Participation Level:  Active  Description of Group: This group addressed positive affirmation toward self and others. Patients went around the room and identified two positive things about themselves and two positive things about a peer in the room. Patients reflected on how it felt to share something positive with others, to identify positive things about themselves, and to hear positive things from others. Patients were encouraged to have a daily reflection of positive characteristics or circumstances.  Therapeutic Goals 1. Patient will verbalize two of their positive qualities 2. Patient will demonstrate empathy for others by stating two positive qualities about a peer in the group 3. Patient will verbalize their feelings when voicing positive self affirmations and when voicing positive affirmations of others 4. Patients will discuss the potential positive impact on their wellness/recovery of focusing on positive traits of self and others.   Summary of Patient Progress:  Pt shared that her positive affirmations are "I am the light I wish to see in the world" and "I am the best me I can be." Patient was interactive with peers and encouraged other pts.     Therapeutic Modalities Cognitive Behavioral Therapy Motivational Interviewing  Otelia Santee, LCSW 08/21/2020 2:23 PM

## 2020-08-21 NOTE — Tx Team (Signed)
Interdisciplinary Treatment and Diagnostic Plan Update  08/21/2020 Time of Session: 9:35am  [name] MRN: [id number]  Principal Diagnosis: <principal problem not specified>  Secondary Diagnoses: Active Problems:   Schizoaffective disorder, chronic condition with acute exacerbation (HCC)   Current Medications:  Current Facility-Administered Medications  Medication Dose Route Frequency Provider Last Rate Last Admin  . acetaminophen (TYLENOL) tablet 650 mg  650 mg Oral Q6H PRN Antonieta Pert, MD   650 mg at 08/20/20 2030  . alum & mag hydroxide-simeth (MAALOX/MYLANTA) 200-200-20 MG/5ML suspension 30 mL  30 mL Oral Q4H PRN Antonieta Pert, MD      . cloNIDine (CATAPRES) tablet 0.1 mg  0.1 mg Oral TID PRN Antonieta Pert, MD      . divalproex (DEPAKOTE ER) 24 hr tablet 500 mg  500 mg Oral QPM Antonieta Pert, MD      . haloperidol (HALDOL) tablet 15 mg  15 mg Oral QHS Antonieta Pert, MD       Or  . haloperidol lactate (HALDOL) injection 15 mg  15 mg Intramuscular QHS Antonieta Pert, MD      . haloperidol (HALDOL) tablet 5 mg  5 mg Oral Daily Antonieta Pert, MD   5 mg at 08/21/20 1435   Or  . haloperidol lactate (HALDOL) injection 5 mg  5 mg Intramuscular Daily Antonieta Pert, MD      . LORazepam (ATIVAN) tablet 1 mg  1 mg Oral Q6H PRN Antonieta Pert, MD   1 mg at 08/21/20 0236   And  . OLANZapine zydis (ZYPREXA) disintegrating tablet 10 mg  10 mg Oral Q8H PRN Antonieta Pert, MD   10 mg at 08/20/20 1431   And  . ziprasidone (GEODON) injection 20 mg  20 mg Intramuscular Q6H PRN Antonieta Pert, MD   20 mg at 08/19/20 1844  . magnesium hydroxide (MILK OF MAGNESIA) suspension 30 mL  30 mL Oral Daily PRN Antonieta Pert, MD      . potassium chloride SA (KLOR-CON) CR tablet 20 mEq  20 mEq Oral BID Antonieta Pert, MD   20 mEq at 08/21/20 0801  . traZODone (DESYREL) tablet 200 mg  200 mg Oral QHS Antonieta Pert, MD      . traZODone  (DESYREL) tablet 50 mg  50 mg Oral QHS PRN Antonieta Pert, MD   50 mg at 08/20/20 2209   PTA Medications: Medications Prior to Admission  Medication Sig Dispense Refill Last Dose  . albuterol (VENTOLIN HFA) 108 (90 Base) MCG/ACT inhaler Inhale 1 puff into the lungs every 6 (six) hours as needed for wheezing or shortness of breath.     . ARIPiprazole (ABILIFY) 5 MG tablet Take 5 mg by mouth daily. (Patient not taking: No sig reported)     . benztropine (COGENTIN) 1 MG tablet Take 1 tablet (1 mg total) by mouth 2 (two) times daily. (Patient not taking: No sig reported) 60 tablet 2   . divalproex (DEPAKOTE ER) 500 MG 24 hr tablet 1 in am 2 at h s (Patient not taking: No sig reported) 90 tablet 2   . haloperidol (HALDOL) 5 MG tablet 1 in am 3 at hs (Patient not taking: No sig reported) 120 tablet 2   . metFORMIN (GLUCOPHAGE) 500 MG tablet Take 500 mg by mouth 2 (two) times daily with a meal.     . Norethindrone Acetate-Ethinyl Estradiol (LOESTRIN) 1.5-30 MG-MCG tablet Take 1 tablet by  mouth daily.     Marland Kitchen omega-3 acid ethyl esters (LOVAZA) 1 g capsule Take 1 capsule (1 g total) by mouth 2 (two) times daily. (Patient not taking: No sig reported) 60 capsule 2   . ondansetron (ZOFRAN) 4 MG tablet Take 1 tablet (4 mg total) by mouth every 6 (six) hours. (Patient not taking: No sig reported) 12 tablet 0   . Prenatal Vit-Fe Fumarate-FA (PRENATAL MULTIVITAMIN) TABS tablet Take 1 tablet by mouth daily. (Patient not taking: No sig reported) 90 tablet 2   . propranolol (INDERAL) 40 MG tablet Take 1.5 tablets (60 mg total) by mouth 2 (two) times daily. (Patient not taking: No sig reported) 60 tablet 3   . risperiDONE microspheres (RISPERDAL CONSTA) 50 MG injection Inject 2 mLs (50 mg total) into the muscle every 14 (fourteen) days. Due 7/16 (Patient not taking: No sig reported) 1 each 11   . tetrahydrozoline-zinc (VISINE-AC) 0.05-0.25 % ophthalmic solution Place 1 drop into both eyes 3 (three) times daily as  needed (dry eyes).     . traZODone (DESYREL) 100 MG tablet Take 2 tablets (200 mg total) by mouth at bedtime. (Patient not taking: No sig reported) 60 tablet 2     Patient Stressors: Health problems Medication change or noncompliance  Patient Strengths: Ability for insight Active sense of humor Average or above average intelligence Capable of independent living Astronomer fund of knowledge Motivation for treatment/growth Physical Health Religious Affiliation Supportive family/friends Work skills  Treatment Modalities: Medication Management, Group therapy, Case management,  1 to 1 session with clinician, Psychoeducation, Recreational therapy.   Physician Treatment Plan for Primary Diagnosis: <principal problem not specified> Long Term Goal(s): Improvement in symptoms so as ready for discharge Improvement in symptoms so as ready for discharge   Short Term Goals: Ability to identify changes in lifestyle to reduce recurrence of condition will improve Ability to verbalize feelings will improve Ability to disclose and discuss suicidal ideas Ability to demonstrate self-control will improve Ability to identify and develop effective coping behaviors will improve Ability to maintain clinical measurements within normal limits will improve Compliance with prescribed medications will improve Ability to identify triggers associated with substance abuse/mental health issues will improve Ability to identify changes in lifestyle to reduce recurrence of condition will improve Ability to verbalize feelings will improve Ability to disclose and discuss suicidal ideas Ability to demonstrate self-control will improve Ability to identify and develop effective coping behaviors will improve Ability to maintain clinical measurements within normal limits will improve Compliance with prescribed medications will improve Ability to identify triggers associated with substance abuse/mental  health issues will improve  Medication Management: Evaluate patient's response, side effects, and tolerance of medication regimen.  Therapeutic Interventions: 1 to 1 sessions, Unit Group sessions and Medication administration.  Evaluation of Outcomes: Progressing  Physician Treatment Plan for Secondary Diagnosis: Active Problems:   Schizoaffective disorder, chronic condition with acute exacerbation (HCC)  Long Term Goal(s): Improvement in symptoms so as ready for discharge Improvement in symptoms so as ready for discharge   Short Term Goals: Ability to identify changes in lifestyle to reduce recurrence of condition will improve Ability to verbalize feelings will improve Ability to disclose and discuss suicidal ideas Ability to demonstrate self-control will improve Ability to identify and develop effective coping behaviors will improve Ability to maintain clinical measurements within normal limits will improve Compliance with prescribed medications will improve Ability to identify triggers associated with substance abuse/mental health issues will improve Ability to identify changes in  lifestyle to reduce recurrence of condition will improve Ability to verbalize feelings will improve Ability to disclose and discuss suicidal ideas Ability to demonstrate self-control will improve Ability to identify and develop effective coping behaviors will improve Ability to maintain clinical measurements within normal limits will improve Compliance with prescribed medications will improve Ability to identify triggers associated with substance abuse/mental health issues will improve     Medication Management: Evaluate patient's response, side effects, and tolerance of medication regimen.  Therapeutic Interventions: 1 to 1 sessions, Unit Group sessions and Medication administration.  Evaluation of Outcomes: Progressing   RN Treatment Plan for Primary Diagnosis: <principal problem not  specified> Long Term Goal(s): Knowledge of disease and therapeutic regimen to maintain health will improve  Short Term Goals: Ability to remain free from injury will improve, Ability to participate in decision making will improve, Ability to verbalize feelings will improve, Ability to disclose and discuss suicidal ideas and Ability to identify and develop effective coping behaviors will improve  Medication Management: RN will administer medications as ordered by provider, will assess and evaluate patient's response and provide education to patient for prescribed medication. RN will report any adverse and/or side effects to prescribing provider.  Therapeutic Interventions: 1 on 1 counseling sessions, Psychoeducation, Medication administration, Evaluate responses to treatment, Monitor vital signs and CBGs as ordered, Perform/monitor CIWA, COWS, AIMS and Fall Risk screenings as ordered, Perform wound care treatments as ordered.  Evaluation of Outcomes: Progressing   LCSW Treatment Plan for Primary Diagnosis: <principal problem not specified> Long Term Goal(s): Safe transition to appropriate next level of care at discharge, Engage patient in therapeutic group addressing interpersonal concerns.  Short Term Goals: Engage patient in aftercare planning with referrals and resources, Increase social support, Increase emotional regulation, Facilitate acceptance of mental health diagnosis and concerns, Identify triggers associated with mental health/substance abuse issues and Increase skills for wellness and recovery  Therapeutic Interventions: Assess for all discharge needs, 1 to 1 time with Social worker, Explore available resources and support systems, Assess for adequacy in community support network, Educate family and significant other(s) on suicide prevention, Complete Psychosocial Assessment, Interpersonal group therapy.  Evaluation of Outcomes: Progressing   Progress in Treatment: Attending  groups: Yes. Participating in groups: Yes. Taking medication as prescribed: Yes. Toleration medication: Yes. Family/Significant other contact made: Yes, individual(s) contacted:  Fiance  Patient understands diagnosis: No. Discussing patient identified problems/goals with staff: Yes. Medical problems stabilized or resolved: Yes. Denies suicidal/homicidal ideation: Yes. Issues/concerns per patient self-inventory: No.   New problem(s) identified: No, Describe:  None  New Short Term/Long Term Goal(s): medication stabilization, elimination of SI thoughts, development of comprehensive mental wellness plan.   Patient Goals:"To be the light"   Discharge Plan or Barriers: Patient recently admitted. CSW will continue to follow and assess for appropriate referrals and possible discharge planning.   Reason for Continuation of Hospitalization: Delusions  Hallucinations Mania Medication stabilization  Estimated Length of Stay: 3 to 5 days   Attendees: Patient: Samantha Ashley 08/21/2020   Physician: Clifton Custard, MD 08/21/2020   Nursing:  08/21/2020   RN Care Manager: 08/21/2020   Social Worker: Melba Coon, LCSWA 08/21/2020   Recreational Therapist:  08/21/2020   Other:  08/21/2020   Other:  08/21/2020   Other: 08/21/2020     Scribe for Treatment Team: Aram Beecham, LCSWA 08/21/2020 2:54 PM

## 2020-08-21 NOTE — Progress Notes (Signed)
D: Patient calm and cooperative, denies  SI/HI/AVH, stated that she had a good night's sleep, reports a good appetite and denies any further concerns. A: Pt is being maintained on Q15 minute checks for safety, and took all meds as ordered R:Will continue to monitor on Q15 minute checks    08/21/20 1025  Psych Admission Type (Psych Patients Only)  Admission Status Involuntary  Psychosocial Assessment  Patient Complaints None  Eye Contact Brief  Facial Expression Animated  Affect Euphoric;Anxious  Speech Logical/coherent  Production designer, theatre/television/film Characteristics Cooperative  Mood Pleasant;Euthymic  Thought Process  Coherency Circumstantial  Content WDL  Delusions None reported or observed  Hallucination None reported or observed  Judgment Poor  Confusion None  Danger to Self  Current suicidal ideation? Denies  Danger to Others  Danger to Others None reported or observed

## 2020-08-22 DIAGNOSIS — F250 Schizoaffective disorder, bipolar type: Principal | ICD-10-CM

## 2020-08-22 LAB — BASIC METABOLIC PANEL
Anion gap: 10 (ref 5–15)
BUN: 5 mg/dL — ABNORMAL LOW (ref 6–20)
CO2: 21 mmol/L — ABNORMAL LOW (ref 22–32)
Calcium: 9.1 mg/dL (ref 8.9–10.3)
Chloride: 109 mmol/L (ref 98–111)
Creatinine, Ser: 0.78 mg/dL (ref 0.44–1.00)
GFR, Estimated: >60 mL/min (ref >60)
Glucose, Bld: 97 mg/dL (ref 70–99)
Potassium: 3.8 mmol/L (ref 3.5–5.1)
Sodium: 140 mmol/L (ref 135–145)

## 2020-08-22 MED ORDER — DIVALPROEX SODIUM ER 500 MG PO TB24
500.0000 mg | ORAL_TABLET | Freq: Every day | ORAL | Status: DC
  Administered 2020-08-22: 500 mg via ORAL
  Filled 2020-08-22 (×2): qty 1

## 2020-08-22 MED ORDER — DIPHENHYDRAMINE HCL 50 MG PO CAPS
50.0000 mg | ORAL_CAPSULE | Freq: Every day | ORAL | Status: DC
  Administered 2020-08-22 – 2020-08-25 (×4): 50 mg via ORAL
  Filled 2020-08-22 (×2): qty 1
  Filled 2020-08-22: qty 2
  Filled 2020-08-22 (×3): qty 1

## 2020-08-22 MED ORDER — HYDROXYZINE HCL 25 MG PO TABS
25.0000 mg | ORAL_TABLET | Freq: Four times a day (QID) | ORAL | Status: DC | PRN
  Administered 2020-08-22 – 2020-08-26 (×3): 25 mg via ORAL
  Filled 2020-08-22 (×3): qty 1

## 2020-08-22 MED ORDER — DIPHENHYDRAMINE HCL 25 MG PO CAPS
25.0000 mg | ORAL_CAPSULE | Freq: Once | ORAL | Status: AC
  Administered 2020-08-22: 25 mg via ORAL
  Filled 2020-08-22 (×2): qty 1

## 2020-08-22 NOTE — Progress Notes (Signed)
Patient ID: Samantha Ashley, female   DOB: 1996-03-02, 25 y.o.   MRN: [id number] Patient calm until told by Dr Mason Jim that she will not be discharged home today, then became extremely agitated and hyperventilating, crying and pacing in the hallway. Pt medicated with Zyprexa Zydis 10mg  initially at 1105am, and still very agitated and angry at 1140am, and medicated with Ativan 1mg  PO. Pt eventually was able to regain control after multiple positive verbal reinforcements from staff. She went to the cafeteria for lunch and is currently resting in bed with no signs of distress. Q15 minute checks being maintained for safety.

## 2020-08-22 NOTE — [facility] Group Notes (Signed)
Occupational Therapy Group Note Date: 08/22/2020 Group Topic/Focus: Feelings Management  Group Description: Group encouraged increased engagement and participation through discussion focused on Building Happiness. Patients were encouraged to engage in discussion and share what happiness means to them and what brings them true feelings of happiness. Patients were also provided with a handout that offered six strategies to build/improve happiness including identifying gratitude, acts of kindness, exercise, meditation, fostering relationships, and positive journaling.   Participation Level: Active   Participation Quality: Independent   Behavior: Calm and Cooperative   Speech/Thought Process: Focused   Affect/Mood: Euthymic   Insight: Fair   Judgement: Fair   Individualization: Samantha Ashley was active in their participation of group discussion/activity. Pt identified "my support system" as something that brings her happiness and shared hers is really big, and consists of her family, her fiances, and friends. Actively participated for duration, however complained of sedation from medications. Encouraged to follow up with MD.   Modes of Intervention: Discussion, Education and Support  Patient Response to Interventions:  Attentive, Engaged and Receptive   Plan: Continue to engage patient in OT groups 2 - 3x/week.  08/22/2020  Donne Hazel, MOT, OTR/L

## 2020-08-22 NOTE — [facility] Group Notes (Signed)
[facility] Group Notes:  (Nursing/MHT/Case Management/Adjunct)  Date:  08/22/2020  Time:  8:53 AM  Type of Therapy:  Group Therapy  Participation Level:  Active  Participation Quality:  Appropriate  Affect:  Appropriate  Cognitive:  Alert and Appropriate  Insight:  Appropriate and Good  Engagement in Group:  Engaged  Modes of Intervention:  Orientation  Summary of Progress/Problems: Her goal for today is being the light she wish to see in the world.   Samantha Ashley J [name] 08/22/2020, 8:53 AM

## 2020-08-22 NOTE — Progress Notes (Signed)
DAR Note: Patient is calm and cooperative this morning, denies SI/HI/AVH, reports that she had a good night's sleep despite report from night shift that she only slept three hours. Pt reports a good appetite, reports a good mood, affect is congruent. Pt ate breakfast, is visible in the milieu interacting with peers and staff. Pt is being maintained on Q15 minute safety checks, all meds being given as ordered. Will continue to monitor on Q15 minute safety checks.

## 2020-08-22 NOTE — Progress Notes (Signed)
DAR NOTE: Patient presents with calm affect and pleasant mood. Complained headache and seasonal allergies, denied auditory and visual hallucinations.  Pt looking forward to discharge today.  Maintained on routine safety checks.  Medications given as prescribed.  Support and encouragement offered as needed. Will continue to monitor.

## 2020-08-22 NOTE — Progress Notes (Addendum)
Recreation Therapy Notes  Recreation Therapy Notes  Date: 4.28.22 Time: 1000 Location: 500 Hall Dayroom  Group Topic: Communication, Team Building, Problem Solving  Goal Area(s) Addresses:  Patient will effectively work with peer towards shared goal.  Patient will identify skills used to make activity successful.  Patient will identify how skills used during activity can be applied to reach post d/c goals.   Behavioral Response: Engaged  Intervention: STEM Activity- Glass blower/designer  Activity: Tallest [employer]. In teams of 5-6, patients were given 12 craft pipe cleaners. Using the materials provided, patients were instructed to compete again the opposing team(s) to build the tallest free-standing structure from floor level. The activity was timed; difficulty incrementally increased by Clinical research associate as Production designer, theatre/television/film continued. Additional directions given including, placing one arm behind their back, working in silence, and shape stipulations. LRT facilitated post-activity discussion reviewing team processes and necessary communication skills involved in completion. Patients were encouraged to reflect how the skills utilized, or not utilized, in this activity can be incorporated to positively impact support systems post discharge.  Education: Pharmacist, community, Scientist, physiological, Discharge Planning   Education Outcome: Acknowledges education/In group clarification offered/Needs additional education.   Clinical Observations/Feedback: Pt was bright and active.  Pt worked well with peers in creating Ashley plan and working it through to create the tower.  Pt gave and listened to suggestions of peers to complete activity.  During processing, pt explained the hardest part was not being able to talk to each other.  Pt went on to describe some of the skilles used for the activity were communication, decision making and problem solving.  Pt also described how your support system should needs to be  people you can trust and each person needs to understand each other.  Pt was appropriate and expressed enjoying the activity.   Samantha Ashley, LRT/CTRS    Samantha Ashley, Samantha Ashley 08/22/2020 10:30 AM

## 2020-08-23 DIAGNOSIS — F1210 Cannabis abuse, uncomplicated: Secondary | ICD-10-CM | POA: Diagnosis present

## 2020-08-23 LAB — CBC WITH DIFFERENTIAL/PLATELET
Abs Immature Granulocytes: 0.02 10*3/uL (ref 0.00–0.07)
Basophils Absolute: 0 10*3/uL (ref 0.0–0.1)
Basophils Relative: 0 %
Eosinophils Absolute: 0.5 10*3/uL (ref 0.0–0.5)
Eosinophils Relative: 9 %
HCT: 38.9 % (ref 36.0–46.0)
Hemoglobin: 12.3 g/dL (ref 12.0–15.0)
Immature Granulocytes: 0 %
Lymphocytes Relative: 41 %
Lymphs Abs: 2.3 10*3/uL (ref 0.7–4.0)
MCH: 26.3 pg (ref 26.0–34.0)
MCHC: 31.6 g/dL (ref 30.0–36.0)
MCV: 83.3 fL (ref 80.0–100.0)
Monocytes Absolute: 0.8 10*3/uL (ref 0.1–1.0)
Monocytes Relative: 13 %
Neutro Abs: 2.2 10*3/uL (ref 1.7–7.7)
Neutrophils Relative %: 37 %
Platelets: 297 10*3/uL (ref 150–400)
RBC: 4.67 MIL/uL (ref 3.87–5.11)
RDW: 16.7 % — ABNORMAL HIGH (ref 11.5–15.5)
WBC: 5.8 10*3/uL (ref 4.0–10.5)
nRBC: 0 % (ref 0.0–0.2)

## 2020-08-23 LAB — HEPATIC FUNCTION PANEL
ALT: 30 U/L (ref 0–44)
AST: 27 U/L (ref 15–41)
Albumin: 3.7 g/dL (ref 3.5–5.0)
Alkaline Phosphatase: 60 U/L (ref 38–126)
Bilirubin, Direct: <0.1 mg/dL (ref 0.0–0.2)
Total Bilirubin: 0.3 mg/dL (ref 0.3–1.2)
Total Protein: 7.9 g/dL (ref 6.5–8.1)

## 2020-08-23 LAB — VALPROIC ACID LEVEL: Valproic Acid Lvl: 26 ug/mL — ABNORMAL LOW (ref 50.0–100.0)

## 2020-08-23 MED ORDER — HALOPERIDOL 5 MG PO TABS
10.0000 mg | ORAL_TABLET | Freq: Every day | ORAL | Status: DC
  Administered 2020-08-23 – 2020-08-25 (×3): 10 mg via ORAL
  Filled 2020-08-23 (×6): qty 2

## 2020-08-23 MED ORDER — DIVALPROEX SODIUM ER 500 MG PO TB24
1000.0000 mg | ORAL_TABLET | Freq: Every day | ORAL | Status: DC
  Administered 2020-08-23 – 2020-08-25 (×3): 1000 mg via ORAL
  Filled 2020-08-23 (×6): qty 2

## 2020-08-23 MED ORDER — HALOPERIDOL 5 MG PO TABS
5.0000 mg | ORAL_TABLET | Freq: Two times a day (BID) | ORAL | Status: DC
  Administered 2020-08-23 – 2020-08-26 (×6): 5 mg via ORAL
  Filled 2020-08-23 (×11): qty 1

## 2020-08-23 MED ORDER — FLUTICASONE PROPIONATE 50 MCG/ACT NA SUSP
1.0000 | Freq: Every day | NASAL | Status: DC
  Administered 2020-08-23 – 2020-08-26 (×4): 1 via NASAL
  Filled 2020-08-23 (×2): qty 16

## 2020-08-23 NOTE — Progress Notes (Signed)
   08/22/20 2121  Psych Admission Type (Psych Patients Only)  Admission Status Involuntary  Psychosocial Assessment  Patient Complaints None  Eye Contact Fair  Facial Expression Animated  Affect Euphoric;Anxious  Speech Logical/coherent  Therapist, sports Cooperative  Mood Pleasant  Thought Process  Coherency Circumstantial  Content WDL  Delusions None reported or observed  Perception WDL  Hallucination None reported or observed  Judgment Poor  Confusion None  Danger to Self  Current suicidal ideation? Denies  Danger to Others  Danger to Others None reported or observed  Samantha Ashley has been out in the milieu.  She was pleasant and cooperative.  She allowed RN to complete EKG without difficulty.  She reported having a good day despite her earlier agitation.  She denied any pain or discomfort and appears to be in no physical distress.  She denied SI/HI or A/V hallucinations.  Q 15 minute checks maintained for safety.  She remains safe on the unit.  We will continue to monitor the progress towards her goals.

## 2020-08-23 NOTE — Progress Notes (Signed)
Patient has been up and active on the unit, attended group this evening and has voiced no complaints. Patient currently denies having pain, -si/hi/a/v hall. Support and encouragement offered, safety maintained on unit, will continue to monitor.  

## 2020-08-23 NOTE — Plan of Care (Signed)
  Problem: Self-Concept: Goal: Level of anxiety will decrease Outcome: Progressing   Problem: Education: Goal: Knowledge of the prescribed therapeutic regimen will improve Outcome: Progressing   Problem: Safety: Goal: Violent Restraint(s) Outcome: Completed/Met

## 2020-08-23 NOTE — [facility] Group Notes (Signed)
[facility] Group Notes:  (Nursing/MHT/Case Management/Adjunct)  Date:  08/23/2020  Time:  11:55 AM  Type of Therapy:  Group Therapy  Participation Level:  Active  Participation Quality:  Appropriate  Affect:  Appropriate  Cognitive:  Alert and Appropriate  Insight:  Appropriate and Good  Engagement in Group:  Engaged  Modes of Intervention:  Orientation  Summary of Progress/Problems: Her goal today is to remain positive.   Consetta Cosner J Mitesh Rosendahl 08/23/2020, 11:55 AM

## 2020-08-23 NOTE — Progress Notes (Signed)
[facility] MD Progress Note  08/23/2020 1:10 PM Samantha Ashley  MRN:  [id number]   Chief Complaint:   Subjective:  Samantha Ashley is a 25 y.o. female with a history of schizoaffective d/o bipolar type, who was initially admitted for inpatient psychiatric hospitalization on 08/19/2020 for management of aggression, pressured speech, delusions, and AH. The patient is currently on Hospital Day 4.   Chart Review from last 24 hours:  The patient's chart was reviewed and nursing notes were reviewed. The patient's case was discussed in multidisciplinary team meeting. Per nursing, patient had an episode of agitation yesterday after she was informed she was not being discharged and required PRN Zyprexa zydis and PRN Ativan for agitation. She was able to attend groups and did not have behavioral outbursts or further agitation during the remainder of the day or evening. Per MAR she was compliant with scheduled medications and did receive Vistaril X1 for anxiety yesterday evening. Sleep time 5.5 hours.  Information Obtained Today During Patient Interview: The patient was seen and evaluated on the unit. The patient states she is feeling better today and is feeling less irritable and less agitated compared to our conversation yesterday. She admits that she was initially upset yesterday to find out that she was not being discharged, but now understands that is for her safety while her medications are adjusted. She is concerned that she is losing PTO days while in the hospital and states she had been working to accrue days to take for her wedding this fall. We discussed that she will likely be inpatient this weekend while we are adjusting her VPA dose and she was calm and cooperative for this discussion.She states the medications are making her "a little sleepy" but she recognizes that she slept better last night compared to previous nights. She denies SI, HI, AVH, paranoia, ideas of reference, or first rank symptoms. 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 She voices no other physical complaints other than some mild backpain which she attributes to walking around on the unit. She reports good appetite. When questioned about her THC use prior to admission, she admits to smoking 2-3 grams of THD daily prior to coming to the unit. She was counseled on the negative impact that [facility] has on the brain chemistry and was encouraged to abstain from [facility] use after discharge.   Principal Problem: Schizoaffective disorder, bipolar type (HCC) Diagnosis: Principal Problem:   Schizoaffective disorder, bipolar type (HCC) Active Problems:   Marijuana abuse  Total Time Spent in Direct Patient Care:  I personally spent 30 minutes on the unit in direct patient care. The direct patient care time included face-to-face time with the patient, reviewing the patient's chart, communicating with other professionals, and coordinating care. Greater than 50% of this time was spent in counseling or coordinating care with the patient regarding goals of hospitalization, psycho-education, and discharge planning needs.  Past Psychiatric History: see admission H&P  Past Medical History:  Past Medical History:  Diagnosis Date  . Asthma    No past surgical history on file.   Family History:  Family History  Problem Relation Age of Onset  . Healthy Mother   . Healthy Father    Family Psychiatric  History: see admission H&P  Social History:  Social History   Substance and Sexual Activity  Alcohol Use No     Social History   Substance and Sexual Activity  Drug Use Yes  . Types: Marijuana    Social History   Socioeconomic History  . Marital  status: Single    Spouse name: Not on file  . Number of children: Not on file  . Years of education: Not on file  . Highest education level: Not on file  Occupational History  . Not on file  Tobacco Use  . Smoking status: Current Every Day Smoker    Types: Cigars  . Smokeless tobacco: Never Used  Substance and Sexual Activity  . Alcohol use: No  . Drug use: Yes    Types: Marijuana  . Sexual activity: Yes  Other Topics Concern  . Not on file  Social History Narrative  . Not on file   Social Determinants of Health   Financial Resource Strain: Not on file  Food Insecurity: Not on file  Transportation Needs: Not on file  Physical Activity: Not on file  Stress: Not on file  Social Connections: Not on file   Sleep: Improving   Appetite:  Good  Current Medications: Current Facility-Administered Medications  Medication Dose Route Frequency Provider Last Rate Last Admin  . acetaminophen (TYLENOL) tablet 650 mg  650 mg Oral Q6H PRN Antonieta Pert, MD   650 mg at 08/22/20 0013  . alum & mag hydroxide-simeth (MAALOX/MYLANTA) 200-200-20 MG/5ML suspension 30 mL  30 mL Oral Q4H PRN Antonieta Pert, MD      . benztropine (COGENTIN) tablet 1 mg  1 mg Oral BID PRN Mason Jim, Tayte Childers E, MD      . cloNIDine (CATAPRES) tablet 0.1 mg  0.1 mg Oral Q8H PRN Mason Jim, Karna Abed E, MD      . diphenhydrAMINE (BENADRYL) capsule 50 mg  50 mg Oral QHS Comer Locket, MD   50 mg at 08/22/20 2121  . divalproex (DEPAKOTE ER) 24 hr tablet 1,000 mg  1,000 mg Oral QHS Genaro Bekker E, MD      . fluticasone (FLONASE) 50 MCG/ACT nasal spray 1 spray  1 spray Each Nare Daily White, Patrice L, NP   1 spray at 08/23/20 0532  . haloperidol (HALDOL) tablet 10 mg  10 mg Oral QHS Jaleiyah Alas E, MD      . haloperidol (HALDOL) tablet 5 mg  5 mg Oral BID Mason Jim, Placida Cambre E, MD      . hydrOXYzine (ATARAX/VISTARIL) tablet 25 mg  25 mg Oral Q6H PRN Comer Locket, MD   25 mg at 08/22/20 1818  . LORazepam (ATIVAN)  tablet 1 mg  1 mg Oral Q6H PRN Antonieta Pert, MD   1 mg at 08/22/20 1140   And  . OLANZapine zydis (ZYPREXA) disintegrating tablet 10 mg  10 mg Oral Q8H PRN Antonieta Pert, MD   10 mg at 08/22/20 1114   And  . ziprasidone (GEODON) injection 20 mg  20 mg Intramuscular Q6H PRN Antonieta Pert, MD   20 mg at 08/19/20 1844  . magnesium hydroxide (MILK OF MAGNESIA) suspension 30 mL  30 mL Oral Daily PRN Antonieta Pert, MD      . traZODone (DESYREL) tablet 200 mg  200 mg Oral QHS Antonieta Pert, MD   200 mg at 08/22/20 2120    Lab Results:  Results for orders placed or performed during the hospital  encounter of 08/19/20 (from the past 48 hour(s))  Basic metabolic panel     Status: Abnormal   Collection Time: 08/22/20  6:25 AM  Result Value Ref Range   Sodium 140 135 - 145 mmol/L   Potassium 3.8 3.5 - 5.1 mmol/L   Chloride 109 98 - 111 mmol/L   CO2 21 (L) 22 - 32 mmol/L   Glucose, Bld 97 70 - 99 mg/dL    Comment: Glucose reference range applies only to samples taken after fasting for at least 8 hours.   BUN 5 (L) 6 - 20 mg/dL   Creatinine, Ser 1.61 0.44 - 1.00 mg/dL   Calcium 9.1 8.9 - 09.6 mg/dL   GFR, Estimated >04 >54 mL/min    Comment: (NOTE) Calculated using the CKD-EPI Creatinine Equation (2021)    Anion gap 10 5 - 15    Comment: Performed at [facility], [address] [place], [place] [address]  Valproic acid level     Status: Abnormal   Collection Time: 08/23/20  7:02 AM  Result Value Ref Range   Valproic Acid Lvl 26 (L) 50.0 - 100.0 ug/mL    Comment: Performed at [facility], [address], [place], [place] [address]  CBC with Differential/Platelet     Status: Abnormal   Collection Time: 08/23/20  7:02 AM  Result Value Ref Range   WBC 5.8 4.0 - 10.5 K/uL   RBC 4.67 3.87 - 5.11 MIL/uL   Hemoglobin 12.3 12.0 - 15.0 g/dL   HCT 29.5 62.1 - 30.8 %   MCV 83.3 80.0 - 100.0 fL   MCH 26.3 26.0 - 34.0 pg   MCHC 31.6  30.0 - 36.0 g/dL   RDW 65.7 (H) 84.6 - 96.2 %   Platelets 297 150 - 400 K/uL   nRBC 0.0 0.0 - 0.2 %   Neutrophils Relative % 37 %   Neutro Abs 2.2 1.7 - 7.7 K/uL   Lymphocytes Relative 41 %   Lymphs Abs 2.3 0.7 - 4.0 K/uL   Monocytes Relative 13 %   Monocytes Absolute 0.8 0.1 - 1.0 K/uL   Eosinophils Relative 9 %   Eosinophils Absolute 0.5 0.0 - 0.5 K/uL   Basophils Relative 0 %   Basophils Absolute 0.0 0.0 - 0.1 K/uL   Immature Granulocytes 0 %   Abs Immature Granulocytes 0.02 0.00 - 0.07 K/uL    Comment: Performed at [facility], [address], [place], [place] [address]  Hepatic function panel     Status: None   Collection Time: 08/23/20  7:02 AM  Result Value Ref Range   Total Protein 7.9 6.5 - 8.1 g/dL   Albumin 3.7 3.5 - 5.0 g/dL   AST 27 15 - 41 U/L   ALT 30 0 - 44 U/L   Alkaline Phosphatase 60 38 - 126 U/L   Total Bilirubin 0.3 0.3 - 1.2 mg/dL   Bilirubin, Direct <1.3 0.0 - 0.2 mg/dL   Indirect Bilirubin NOT CALCULATED 0.3 - 0.9 mg/dL    Comment: Performed at [facility], [address], [place], [place] [address]    Blood Alcohol level:  Lab Results  Component Value Date   [facility] <10 10/25/2018   ETH <10 10/10/2018    Metabolic Disorder Labs: Lab Results  Component Value Date   HGBA1C 5.1 08/20/2020   MPG 99.67 08/20/2020   MPG 93.93 10/11/2018   Lab Results  Component Value Date   PROLACTIN 98.3 (  H) 10/29/2018   PROLACTIN 52.5 (H) 10/11/2018   Lab Results  Component Value Date   CHOL 86 08/20/2020   TRIG 93 08/20/2020   HDL 23 (L) 08/20/2020   CHOLHDL 3.7 08/20/2020   VLDL 19 08/20/2020   LDLCALC 44 08/20/2020   LDLCALC 54 10/29/2018   Musculoskeletal: Strength & Muscle Tone: within normal limits Gait & Station: normal, steady Patient leans: N/A  Psychiatric Specialty Exam: Physical Exam Vitals reviewed.  HENT:     Head: Normocephalic.  Pulmonary:     Effort: Pulmonary effort is normal.   Neurological:     Mental Status: She is alert.     Review of Systems  Respiratory: Negative for shortness of breath.   Cardiovascular: Negative for chest pain.  Gastrointestinal: Negative for diarrhea, nausea and vomiting.  Musculoskeletal: Positive for back pain.  Skin: Negative for rash.       Perceives that the texture of her tattoos have changed after Haldol dec shot     Blood pressure 134/82, pulse 100, temperature 97.9 F (36.6 C), temperature source Oral, resp. rate 18, height 5\' 5"  (1.651 m), weight 113.4 kg, SpO2 99 %.Body mass index is 41.6 kg/m.  General Appearance: fair hygiene, casually dressed  Eye Contact:  Good  Speech:  Clear and coherent, regular rate  Volume:  normal  Mood:  Calmer, no longer irritable; mildly elevated  Affect:  Brighter affect, pleasant but mildly euphoric  Thought Process:  Circumstantial   Orientation:  Oriented to self, month, date, year and city  Thought Content:  Denies AVH, paranoia, ideas of reference, or first rank symptoms but has somatic delusion that her haldol has caused a change to the texture of her tattoos and mildly grandiose on exam  Suicidal Thoughts:  No  Homicidal Thoughts:  No  Memory:  Recent;   Fair  Judgement:  Fair  Insight:  Fair  Psychomotor Activity:  Normal  Concentration:  improved  Recall:  [employer]  Fund of Knowledge:  Fair  Language:  Good  Akathisia:  Negative  Assets:  Communication Skills Desire for Improvement Housing Resilience Social Support  ADL's:  Intact  Cognition:  WNL  Sleep:  Number of Hours: 5.5   Treatment Plan Summary: Diagnoses / Active Problems: Schizoaffective d/o bipolar type by hx HTN Cannabis use d/o  PLAN: 1. Safety and Monitoring:  -- Involuntary admission to inpatient psychiatric unit for safety, stabilization and treatment  -- Daily contact with patient to assess and evaluate symptoms and progress in treatment  -- Patient's case to be discussed in multi-disciplinary  team meeting  -- Observation Level : q15 minute checks  -- Vital signs:  q12 hours  -- Precautions: suicide  2. Psychiatric Diagnoses and Treatment:   Schizoaffective d/o bipolar type by hx  -- Received Haldol dec 100mg  IM on 08/20/20 -- Change Haldol to 5mg  po bid and 10mg  qhs to spread dose throughout the day as oral bridging as LAI becomes therapeutic -- Continue Trazodone 200mg  qhs for sleep and Benadryl 50mg  po qhs for sleep -- Cogentin 1mg  bid PRN EPS/tremor -- Increase VPA ER to 1000mg  po qhs (08/23/20: VPA level 26; WBC 5.8, H/H 12.3/38.9 platelets 297; AST 27 and ALT 30 with remainder of hepatic function panel WNL) -- Continue Zyprexa zydis agitation protocol -- Vistaril 25mg  q6 hours PRN anxiety  -- Metabolic profile and EKG monitoring obtained while on an atypical antipsychotic (BMI:41.60; Lipid Panel:cholesterol 86, triglycerides 93, HDL 23, LDL 44; HbgA1c:5.1; QTc:45074ms) EKG shows NSR 83bpm  with QTc  -- Encouraged patient to participate in unit milieu and in scheduled group therapies   -- Short Term Goals: Ability to demonstrate self-control will improve and Ability to identify and develop effective coping behaviors will improve  -- Long Term Goals: Improvement in symptoms so as ready for discharge   High Risk Medication Use: -- The complexity of this patient's case involves drug therapy with Depakote which requires intensive monitoring for toxicity. This is in part due to a narrow therapeutic window as well as potential for toxicity with this medication.    Cannabis use d/o  -- UDS positive for THC  -- Counseled on need to abstain from illicit substances   3. Medical Issues Being Addressed:   HTN  -- Continue clonidine 0.1mg  q8 hours PRN SBP >150 or DBP >100    Hypokalemia - resolved  -- Repeat BMP 08/22/20: shows K+ 3.8   Hyponatremia - resolved  -- Repeat BMP 08/22/20: shows Na+ 140   Elevated Creatinine (1.22) - resolved  -- Repeat BMP 08/22/20: shows creatinine  0.78 with BUN 5  -- po hydration encouraged   Leukocytosis on admission (WBC 12.5) - resolved  -- 08/23/20: WBC 5.8  4. Discharge Planning:   -- Social work and case management to assist with discharge planning and identification of hospital follow-up needs prior to discharge  -- Estimated LOS: 3-4 days  -- Discharge Concerns: Need to establish a safety plan; Medication compliance and effectiveness  -- Discharge Goals: Return home with outpatient referrals for mental health follow-up including medication management/psychotherapy  Comer Locket, MD, FAPA 08/23/2020, 1:10 PM

## 2020-08-23 NOTE — Progress Notes (Incomplete)
Samantha Ashley woke up complaining of her allergies and nasal stuffiness.  Order obtained from NP for Flonase daily.

## 2020-08-23 NOTE — Progress Notes (Signed)
   08/23/20 1200  Psych Admission Type (Psych Patients Only)  Admission Status Involuntary  Psychosocial Assessment  Patient Complaints None  Eye Contact Fair  Facial Expression Animated  Affect Euphoric;Anxious  Speech Logical/coherent  Therapist, sports Cooperative  Mood Pleasant  Thought Process  Coherency Circumstantial  Content WDL  Delusions None reported or observed  Perception WDL  Hallucination None reported or observed  Judgment Poor  Confusion None  Danger to Self  Current suicidal ideation? Denies  Danger to Others  Danger to Others None reported or observed

## 2020-08-23 NOTE — Progress Notes (Signed)
Recreation Therapy Notes  Date: 4.29.22 Time: 1000 Location: 500 Hall Dayroom  Group Topic: Decision Making, Problem Solving, Communication  Goal Area(s) Addresses:  Patient will effectively work with peer towards shared goal.  Patient will identify factors that guided their decision making.  Patient will pro-socially communicate ideas during group session.   Behavioral Response: Engaged  Intervention: Survival Scenario - pencil, paper  Activity:  Patients were given a scenario that they were going to be stranded on a deserted [place] for several months before being rescued. Writer tasked them with making a list of 15 things they would choose to bring with them for "survival". The list of items was prioritized most important to least. Each patient would come up with their own list, then work together to create a new list of 15 items while in a group of 3-5 peers. LRT discussed each person's list and how it differed from others. The debrief included discussion of priorities, good decisions versus bad decisions, and how it is important to think before acting so we can make the best decision possible. LRT tied the concept of effective communication among group members to patient's support systems outside of the hospital and its benefit post discharge.  Education: Pharmacist, community, Priorities, Support System, Discharge Planning    Education Outcome:   Acknowledges education/In group clarification/Needs additional education  Clinical Observations/Feedback: Pt was engaged and worked well within the rules of the activity.  Pt was bright and seemed calm.  Five of the things pt included in her list were food, water, shelter, knife and her boyfriend [name].  Pt identified a negative decision made that pt would do over was "not drink as much as I did last week".  Pt identified a positive decision as "allowing myself to be brought back to the hospital".  Pt also explained how yesterday she made a decision to  pace the hall when she was upset.  Pt was able to calm down and explain to the doctor this morning why she reacted the way she did.     Caroll Rancher, LRT/CTRS    Caroll Rancher A 08/23/2020 11:36 AM

## 2020-08-23 NOTE — [facility] Group Notes (Signed)
[facility] LCSW Group Therapy   08/23/2020 11:54 AM    Type of Therapy and Topic:  Group Therapy:  Strengths Exploration   Participation Level: Active  Description of Group: This group allows individuals to explore their strengths, learn to use strengths in new ways to improve well-being. Strengths-based interventions involve identifying strengths, understanding how they are used, and learning new ways to apply them. Individuals will identify their strengths, and then explore their roles in different areas of life (relationships, professional life, and personal fulfillment). Individuals will think about ways in which they currently use their strengths, along with new ways they could begin using them.    Therapeutic Goals 1. Patient will verbalize two of their strengths 2. Patient will identify how their strengths are currently used 3. Patient will identify two new ways to apply their strengths  4. Patients will create a plan to apply their strengths in their daily lives     Summary of Patient Progress:  Pt accepted worksheet. Pt was active in discussing strengths and sharing other pt's strengths out.        Therapeutic Modalities Cognitive Behavioral Therapy Motivational Interviewing   Ruthann Cancer MSW, LCSW Clincal Social Worker  [facility]

## 2020-08-23 NOTE — [facility] Group Notes (Signed)
SPIRITUALITY GROUP NOTE  Spirituality group facilitated by Wilkie Aye, MDiv, BCC.  Group Description: Group focused on topic of hope. Patients participated in facilitated discussion around topic, connecting with one another around experiences and definitions for hope. Group members engaged with visual explorer photos, reflecting on what hope looks like for them today. Group engaged in discussion around how their definitions of hope are present today in hospital.  Modalities: Psycho-social ed, Adlerian, Narrative, MI  Patient Progress: Roshawna attended and participated in group.  She was very engaged and showed good self-reflection and active listening to others.  Chaplain Dyanne Carrel, Bcc Pager, [phone] 3:07 PM

## 2020-08-24 MED ORDER — HYDROCORTISONE 0.5 % EX CREA
TOPICAL_CREAM | Freq: Three times a day (TID) | CUTANEOUS | Status: DC | PRN
  Administered 2020-08-25 (×2): 1 via TOPICAL
  Filled 2020-08-24: qty 28.35

## 2020-08-24 NOTE — Progress Notes (Signed)
[facility] Group Notes:  (Nursing/MHT/Case Management/Adjunct)  Date:  08/24/2020  Time:  2030 Type of Therapy:  wrap up group  Participation Level:  Active  Participation Quality:  Appropriate, Attentive, Sharing and Supportive  Affect:  Excited  Cognitive:  Alert  Insight:  Improving  Engagement in Group:  Engaged  Modes of Intervention:  Clarification, Education and Support  Summary of Progress/Problems: Positive thinking and self-care were discussed.   Marcille Buffy 08/24/2020, 10:24 PM

## 2020-08-24 NOTE — [facility] Group Notes (Signed)
Psychoeducational Group Note    Date:08/24/2020 Time: 1300-1400    Life Skills:  A group where two lists are made. What people need and what are things that we do that are healthy. The lists are developed by the patients and it is explained that we often do the actions that are not healthy to get our list of needs met.   Purpose of Group: . The group focus' on teaching patients on how to identify their needs and how to develop the coping skills needed to get their needs met  Participation Level:  Active  Participation Quality:  Appropriate  Affect:  Appropriate  Cognitive:  Oriented  Insight:  Improving  Engagement in Group:  Engaged  Additional Comments: Pt attended the group and rates her energy level at a 10/10. Pt reports that she feels that she will have better control and not Samantha Ashley herself over her emotions as she has done in the past. ...  Samantha Ashley

## 2020-08-24 NOTE — Progress Notes (Signed)

## 2020-08-24 NOTE — Progress Notes (Signed)
   08/24/20 1400  Psych Admission Type (Psych Patients Only)  Admission Status Involuntary  Psychosocial Assessment  Patient Complaints Anxiety  Eye Contact Fair  Facial Expression Animated  Affect Euphoric  Speech Logical/coherent  Interaction Assertive  Motor Activity Fidgety  Appearance/Hygiene Disheveled  Thought Process  Coherency Circumstantial  Content WDL  Delusions None reported or observed  Perception WDL  Hallucination None reported or observed  Judgment Poor  Confusion None  Danger to Self  Current suicidal ideation? Denies  Danger to Others  Danger to Others None reported or observed

## 2020-08-24 NOTE — Progress Notes (Signed)
Patient has been up and active on the unit, attended group this evening and has voiced no complaints. Patient currently denies having pain, -si/hi/a/v hall. Support and encouragement offered, safety maintained on unit, will continue to monitor.  

## 2020-08-24 NOTE — [facility] Group Notes (Signed)
.  Psychoeducational Group Note  Date: 08/24/2020 Time: 0900-1000    Goal Setting   Purpose of Group: This group helps to provide patients with the steps of setting a goal that is specific, measurable, attainable, realistic and time specific. A discussion on how we keep ourselves stuck with negative self talk.    Participation Level:  Active  Participation Quality:  Appropriate  Affect:  Appropriate  Cognitive:  Appropriate  Insight:  Improving  Engagement in Group:  Engaged  Additional Comments:  Pt states that she is at a 10/10, States "in 2020, I lost my mind, and in 2022, I lost my tempter  Samantha Ashley A

## 2020-08-24 NOTE — Progress Notes (Signed)
[facility] MD Progress Note  08/24/2020 2:35 PM Samantha Ashley  MRN:  [id number]  Principal Problem: Schizoaffective disorder, bipolar type (HCC) Diagnosis: Principal Problem:   Schizoaffective disorder, bipolar type (HCC) Active Problems:   Marijuana abuse  Subjective:  "Last night I slept all night and despite me sleeping all night. Maybe because I went to jail at 17, this feels like an institution and I'm not comfortable here. I can't sleep comfortably here. I remember last Sunday running up and down the hall. I tell people I lost my mind in 2020, I lost my temper in 2022".   Objective:  Samantha Ashley is a 25 y.o. female with a history of schizoaffective d/o bipolar type, who was initially admitted for inpatient psychiatric hospitalization on 08/19/2020 for management of aggression, pressured speech, delusions, and AH. The patient is currently on Hospital Day 5.  Assessment:  The patient's chart and nursing notes were reviewed. This patient's case was discussed in the multi-disciplinary team meeting. where it was noted patient had Depakote increased to 1000 mg HS (08/23/20) and received Haldol Decanoate 100 mg IM (08/20/20). PRN Hydroxyzine 25 mg prn given x1 (08/23/20) 1510.  On assessment patient presents calm and cooperative, bright affect. Engages in assessment. Patient states she is feeling "good today" and slept all night. No behavioral issues noted in chart. Patient discussed events that led to admission and states she can remember "everything"; denies any substance use, UDS+ marijuana. Endorses attending groups and showed provider journal where she has been completing homework "30 positive things about Danny". 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 Later stated she last attended the university in 2020 when she had her  "1st psychotic break". Denies any suicidal ideations stating "I love myself. I hate the fact that I'm even in here when I should be planning my wedding. I'm getting married in October and have a lot to do". Denies any homicidal ideations, auditory or visual hallucinations, and does not appear to be responding to any external/internal stimuli at this time. Patient continues to state the Haldol Deconoate injection has affected her tattoos making them "feel like Braille".  Total Time spent with patient: 30 minutes  Past Psychiatric History: see H&P  Past Medical History:  Past Medical History:  Diagnosis Date  . Asthma    No past surgical history on file. Family History:  Family History  Problem Relation Age of Onset  . Healthy Mother   . Healthy Father    Family Psychiatric  History: not noted Social History:  Social History   Substance and Sexual Activity  Alcohol Use No     Social History   Substance and Sexual Activity  Drug Use Yes  . Types: Marijuana    Social History   Socioeconomic History  . Marital status: Single    Spouse name: Not on file  . Number of children: Not on file  . Years of education: Not on file  . Highest education level: Not on file  Occupational History  . Not on file  Tobacco Use  . Smoking status: Current Every Day Smoker    Types: Cigars  . Smokeless tobacco: Never Used  Substance and Sexual Activity  . Alcohol use: No  . Drug use: Yes    Types: Marijuana  . Sexual activity: Yes  Other Topics Concern  .  Not on file  Social History Narrative  . Not on file   Social Determinants of Health   Financial Resource Strain: Not on file  Food Insecurity: Not on file  Transportation Needs: Not on file  Physical Activity: Not on file  Stress: Not on file  Social Connections: Not on file   Additional Social History:    Sleep: Fair  Appetite:  Fair  Current Medications: Current Facility-Administered Medications  Medication Dose Route  Frequency Provider Last Rate Last Admin  . acetaminophen (TYLENOL) tablet 650 mg  650 mg Oral Q6H PRN Antonieta Pert, MD   650 mg at 08/22/20 0013  . alum & mag hydroxide-simeth (MAALOX/MYLANTA) 200-200-20 MG/5ML suspension 30 mL  30 mL Oral Q4H PRN Antonieta Pert, MD      . benztropine (COGENTIN) tablet 1 mg  1 mg Oral BID PRN Mason Jim, Amy E, MD      . cloNIDine (CATAPRES) tablet 0.1 mg  0.1 mg Oral Q8H PRN Mason Jim, Amy E, MD      . diphenhydrAMINE (BENADRYL) capsule 50 mg  50 mg Oral QHS Comer Locket, MD   50 mg at 08/23/20 2106  . divalproex (DEPAKOTE ER) 24 hr tablet 1,000 mg  1,000 mg Oral QHS Mason Jim, Amy E, MD   1,000 mg at 08/23/20 2105  . fluticasone (FLONASE) 50 MCG/ACT nasal spray 1 spray  1 spray Each Nare Daily White, Patrice L, NP   1 spray at 08/24/20 0815  . haloperidol (HALDOL) tablet 10 mg  10 mg Oral QHS Mason Jim, Amy E, MD   10 mg at 08/23/20 2105  . haloperidol (HALDOL) tablet 5 mg  5 mg Oral BID Bartholomew Crews E, MD   5 mg at 08/24/20 0815  . hydrOXYzine (ATARAX/VISTARIL) tablet 25 mg  25 mg Oral Q6H PRN Comer Locket, MD   25 mg at 08/23/20 1510  . LORazepam (ATIVAN) tablet 1 mg  1 mg Oral Q6H PRN Antonieta Pert, MD   1 mg at 08/22/20 1140   And  . OLANZapine zydis (ZYPREXA) disintegrating tablet 10 mg  10 mg Oral Q8H PRN Antonieta Pert, MD   10 mg at 08/22/20 1114   And  . ziprasidone (GEODON) injection 20 mg  20 mg Intramuscular Q6H PRN Antonieta Pert, MD   20 mg at 08/19/20 1844  . magnesium hydroxide (MILK OF MAGNESIA) suspension 30 mL  30 mL Oral Daily PRN Antonieta Pert, MD      . traZODone (DESYREL) tablet 200 mg  200 mg Oral QHS Antonieta Pert, MD   200 mg at 08/23/20 2106   Lab Results:  Results for orders placed or performed during the hospital encounter of 08/19/20 (from the past 48 hour(s))  Valproic acid level     Status: Abnormal   Collection Time: 08/23/20  7:02 AM  Result Value Ref Range   Valproic Acid Lvl 26  (L) 50.0 - 100.0 ug/mL    Comment: Performed at [facility], [address], [place], [place] [address]  CBC with Differential/Platelet     Status: Abnormal   Collection Time: 08/23/20  7:02 AM  Result Value Ref Range   WBC 5.8 4.0 - 10.5 K/uL   RBC 4.67 3.87 - 5.11 MIL/uL   Hemoglobin 12.3 12.0 - 15.0 g/dL   HCT 60.4 54.0 - 98.1 %   MCV 83.3 80.0 - 100.0 fL   MCH 26.3 26.0 - 34.0 pg   MCHC 31.6  30.0 - 36.0 g/dL   RDW 16.116.7 (H) 09.611.5 - 04.515.5 %   Platelets 297 150 - 400 K/uL   nRBC 0.0 0.0 - 0.2 %   Neutrophils Relative % 37 %   Neutro Abs 2.2 1.7 - 7.7 K/uL   Lymphocytes Relative 41 %   Lymphs Abs 2.3 0.7 - 4.0 K/uL   Monocytes Relative 13 %   Monocytes Absolute 0.8 0.1 - 1.0 K/uL   Eosinophils Relative 9 %   Eosinophils Absolute 0.5 0.0 - 0.5 K/uL   Basophils Relative 0 %   Basophils Absolute 0.0 0.0 - 0.1 K/uL   Immature Granulocytes 0 %   Abs Immature Granulocytes 0.02 0.00 - 0.07 K/uL    Comment: Performed at [facility], [address] [place], [place] [address]  Hepatic function panel     Status: None   Collection Time: 08/23/20  7:02 AM  Result Value Ref Range   Total Protein 7.9 6.5 - 8.1 g/dL   Albumin 3.7 3.5 - 5.0 g/dL   AST 27 15 - 41 U/L   ALT 30 0 - 44 U/L   Alkaline Phosphatase 60 38 - 126 U/L   Total Bilirubin 0.3 0.3 - 1.2 mg/dL   Bilirubin, Direct <1.9<0.1 0.0 - 0.2 mg/dL   Indirect Bilirubin NOT CALCULATED 0.3 - 0.9 mg/dL    Comment: Performed at [facility], [address] [place], [place] [address]   Blood Alcohol level:  Lab Results  Component Value Date   ETH <10 10/25/2018   ETH <10 10/10/2018   Metabolic Disorder Labs: Lab Results  Component Value Date   HGBA1C 5.1 08/20/2020   MPG 99.67 08/20/2020   MPG 93.93 10/11/2018   Lab Results  Component Value Date   PROLACTIN 98.3 (H) 10/29/2018   PROLACTIN 52.5 (H) 10/11/2018   Lab Results  Component Value Date   CHOL 86 08/20/2020    TRIG 93 08/20/2020   HDL 23 (L) 08/20/2020   CHOLHDL 3.7 08/20/2020   VLDL 19 08/20/2020   LDLCALC 44 08/20/2020   LDLCALC 54 10/29/2018   Physical Findings: AIMS:  , ,  ,  ,    CIWA:    COWS:     Musculoskeletal: Strength & Muscle Tone: within normal limits Gait & Station: normal Patient leans: N/A  Psychiatric Specialty Exam:  Presentation  General Appearance: Casual  Eye Contact:Fair  Speech:Clear and Coherent; Normal Rate  Speech Volume:Normal  Handedness:Right  Mood and Affect  Mood:Euthymic  Affect:Non-Congruent  Thought Process  Thought Processes:Goal Directed  Descriptions of Associations:Loose  Orientation:Full (Time, Place and Person)  Thought Content:Logical  History of Schizophrenia/Schizoaffective disorder:Yes  Duration of Psychotic Symptoms:Greater than six months  Hallucinations:Hallucinations: None  Ideas of Reference:None  Suicidal Thoughts:Suicidal Thoughts: No  Homicidal Thoughts:Homicidal Thoughts: No  Sensorium  Memory:Immediate Fair; Recent Fair; Remote Fair  Judgment:Fair  Insight:Fair; Present  Executive Functions  Concentration:Fair  Attention Span:Fair  Recall:Fair  Fund of Knowledge:Fair  Language:Fair  Psychomotor Activity  Psychomotor Activity:Psychomotor Activity: Normal  Assets  Assets:Communication Skills; Desire for Improvement; Financial Resources/Insurance; Housing; Intimacy; Leisure Time; Physical Health; Resilience; Social Support; Talents/Skills; Vocational/Educational  Sleep  Sleep:Sleep: Fair  Physical Exam: Physical Exam Vitals and nursing note reviewed.  Psychiatric:        Attention and Perception: Attention and perception normal.        Mood and Affect: Mood and affect normal.        Speech: Speech normal.  Behavior: Behavior is cooperative.        Thought Content: Thought content normal.        Cognition and Memory: Cognition and memory normal.        Judgment: Judgment  normal.    Review of Systems  Psychiatric/Behavioral: Negative.   All other systems reviewed and are negative.  Blood pressure (!) 140/119, pulse (!) 109, temperature 98.6 F (37 C), temperature source Oral, resp. rate 18, height 5\' 5"  (1.651 m), weight 113.4 kg, SpO2 100 %. Body mass index is 41.6 kg/m.  Treatment Plan Summary: Daily contact with patient to assess and evaluate symptoms and progress in treatment and Medication management  PLAN: 1. Safety and Monitoring:             -- Involuntary admission to inpatient psychiatric unit for safety,   stabilization and treatment             -- Daily contact with patient to assess and evaluate symptoms   and progress in treatment             -- Patient's case to be discussed in multi-disciplinary team   meeting             -- Observation Level : q15 minute checks             -- Vital signs:  q12 hours             -- Precautions: suicide  2. Psychiatric Diagnoses and Treatment:              Schizoaffective d/o bipolar type by hx        -- Received Haldol dec 100mg  IM on 08/20/20 -- Change Haldol to 5mg  po bid and 10mg  qhs to spread dose throughout the day as oral bridging as LAI becomes therapeutic -- Continue Trazodone 200mg  qhs for sleep and Benadryl 50mg  po qhs for sleep -- Cogentin 1mg  bid PRN EPS/tremor -- Increase VPA ER to 1000mg  po qhs (08/23/20: VPA level 26; WBC 5.8, H/H 12.3/38.9 platelets 297; AST 27 and ALT 30 with remainder of hepatic function panel WNL) -- Continue Zyprexa zydis agitation protocol -- Vistaril 25mg  q6 hours PRN anxiety             -- Metabolic profile and EKG monitoring obtained while on an   atypical antipsychotic (BMI:41.60; Lipid Panel:cholesterol 86,   triglycerides 93, HDL 23, LDL 44; HbgA1c:5.1;   QTc:445ms) EKG shows NSR 83bpm with QTc             -- Encouraged patient to participate in unit milieu and in   scheduled group therapies              -- Short Term Goals: Ability to  demonstrate self-control will   improve and Ability to identify and develop effective coping   behaviors will improve             -- Long Term Goals: Improvement in symptoms so as ready for   discharge              High Risk Medication Use: -- The complexity of this patient's case involves drug therapy  with Depakote which requires intensive monitoring for toxicity.  This is in part due to a narrow therapeutic window as well as  potential for toxicity with this medication.               Cannabis use d/o             --  UDS positive for THC             -- Counseled on need to abstain from illicit substances              3. Medical Issues Being Addressed:              HTN             -- Continue clonidine 0.1mg  q8 hours PRN SBP >150 or DBP   >100               Hypokalemia - resolved             -- Repeat BMP 08/22/20: shows K+ 3.8              Hyponatremia - resolved             -- Repeat BMP 08/22/20: shows Na+ 140              Elevated Creatinine (1.22) - resolved             -- Repeat BMP 08/22/20: shows creatinine 0.78 with BUN 5             -- po hydration encouraged              Leukocytosis on admission (WBC 12.5) - resolved             -- 08/23/20: WBC 5.8  4. Discharge Planning:              -- Social work and case management to assist with discharge   planning and identification of hospital follow-up needs prior to   discharge             -- Estimated LOS: 3-4 days             -- Discharge Concerns: Need to establish a safety plan;   Medication compliance and effectiveness             -- Discharge Goals: Return home with outpatient referrals for   mental health follow-up including medication   management/psychotherapy  Loletta Parish, NP 08/24/2020, 2:35 PM

## 2020-08-24 NOTE — Progress Notes (Signed)
   08/24/20 2135  COVID-19 Daily Checkoff  Have you had a fever (temp > 37.80C/100F)  in the past 24 hours?  No  If you have had runny nose, nasal congestion, sneezing in the past 24 hours, has it worsened? No  COVID-19 EXPOSURE  Have you traveled outside the state in the past 14 days? No  Have you been in contact with someone with a confirmed diagnosis of COVID-19 or PUI in the past 14 days without wearing appropriate PPE? No  Have you been living in the same home as a person with confirmed diagnosis of COVID-19 or a PUI (household contact)? No  Have you been diagnosed with COVID-19? No

## 2020-08-25 NOTE — Plan of Care (Signed)
Patient remained pleasant and cooperative with no concerns. Alert and oriented. Denied SI/HI/AVA. Had no sign of distress.

## 2020-08-25 NOTE — Progress Notes (Signed)

## 2020-08-25 NOTE — Progress Notes (Addendum)
[facility] MD Progress Note  08/25/2020 2:18 PM Samantha Ashley  MRN:  [id number]  Principal Problem: Schizoaffective disorder, bipolar type (HCC) Diagnosis: Principal Problem:   Schizoaffective disorder, bipolar type (HCC) Active Problems:   Marijuana abuse  Subjective:   "I was just getting anxious because I was seeing everyone else getting updates and I'm ready to go home".    Objective:  Samantha Ashley a 25 y.o.femalewith a history of schizoaffective d/o bipolar type, who was initially admitted for inpatient psychiatric hospitalization on4/25/2022for management of aggression, pressured speech, delusions, and AH.The patient is currently on Hospital Day 6. The patient's chart and nursing notes were reviewed. This patient's case was discussed in the multi-disciplinary team meeting. where it was noted patient had Depakote increased to 1000 mg HS (08/23/20) and received Haldol Decanoate 100 mg IM (08/20/20). PRN Hydroxyzine 25 mg prn given x1 (08/23/20) 1510.  Assessment:  On assessment patient presents calm and cooperative, bright affect. Engages in assessment. Patient states she is feeling "good today, just been getting anxious because I hadn't talked to anyone yet and everyone seemed like they were getting updates and I feel I have done so well and it's not being acknowledged". Provider acknowledged patient's feelings and provided empathetic reassurance of her improvements, discussed redirecting negative thoughts and perspective. Patient was able to verbalize insight into her situation and states she is ready to go home to be with her family. Patient states she slept all night. No behavioral issues noted in chart. Endorses attending groups and participation in unit programming. Patient was able to verbalize her supports as her fiance and her family; states she is youngest of 5 and her fiance is the second youngest of 70, "I have a lot of love around me so I know I have support". She denies any  suicidal ideations stating "I love myself. I'm getting married October 22 and I plan on being here for that". Denies any homicidal ideations, auditory or visual hallucinations, and does not appear to be responding to any external/internal stimuli at this time. Patient continues to state the Haldol Deconoate injection has affected her tattoos making them "feel like Braille"; currently using topical hydrocortisone cream for itching with some improvement.  Total Time spent with patient: 30 minutes  Past Psychiatric History: see H&P  Past Medical History:  Past Medical History:  Diagnosis Date  . Asthma    No past surgical history on file. Family History:  Family History  Problem Relation Age of Onset  . Healthy Mother   . Healthy Father    Family Psychiatric  History: see H&P Social History:  Social History   Substance and Sexual Activity  Alcohol Use No     Social History   Substance and Sexual Activity  Drug Use Yes  . Types: Marijuana    Social History   Socioeconomic History  . Marital status: Single    Spouse name: Not on file  . Number of children: Not on file  . Years of education: Not on file  . Highest education level: Not on file  Occupational History  . Not on file  Tobacco Use  . Smoking status: Current Every Day Smoker    Types: Cigars  . Smokeless tobacco: Never Used  Substance and Sexual Activity  . Alcohol use: No  . Drug use: Yes    Types: Marijuana  . Sexual activity: Yes  Other Topics Concern  . Not on file  Social History Narrative  . Not on file  Social Determinants of Health   Financial Resource Strain: Not on file  Food Insecurity: Not on file  Transportation Needs: Not on file  Physical Activity: Not on file  Stress: Not on file  Social Connections: Not on file   Additional Social History:   Sleep: Good  Appetite:  Good  Current Medications: Current Facility-Administered Medications  Medication Dose Route Frequency Provider  Last Rate Last Admin  . acetaminophen (TYLENOL) tablet 650 mg  650 mg Oral Q6H PRN Antonieta Pert, MD   650 mg at 08/22/20 0013  . alum & mag hydroxide-simeth (MAALOX/MYLANTA) 200-200-20 MG/5ML suspension 30 mL  30 mL Oral Q4H PRN Antonieta Pert, MD      . benztropine (COGENTIN) tablet 1 mg  1 mg Oral BID PRN Mason Jim, Tia Hieronymus E, MD      . cloNIDine (CATAPRES) tablet 0.1 mg  0.1 mg Oral Q8H PRN Mason Jim, Raquel Sayres E, MD      . diphenhydrAMINE (BENADRYL) capsule 50 mg  50 mg Oral QHS Comer Locket, MD   50 mg at 08/24/20 2108  . divalproex (DEPAKOTE ER) 24 hr tablet 1,000 mg  1,000 mg Oral QHS Mason Jim, Analilia Geddis E, MD   1,000 mg at 08/24/20 2108  . fluticasone (FLONASE) 50 MCG/ACT nasal spray 1 spray  1 spray Each Nare Daily White, Patrice L, NP   1 spray at 08/25/20 0749  . haloperidol (HALDOL) tablet 10 mg  10 mg Oral QHS Mason Jim, Armonte Tortorella E, MD   10 mg at 08/24/20 2108  . haloperidol (HALDOL) tablet 5 mg  5 mg Oral BID Comer Locket, MD   5 mg at 08/25/20 0749  . hydrocortisone cream 0.5 %   Topical TID PRN Comer Locket, MD   1 application at 08/25/20 1308  . hydrOXYzine (ATARAX/VISTARIL) tablet 25 mg  25 mg Oral Q6H PRN Comer Locket, MD   25 mg at 08/23/20 1510  . LORazepam (ATIVAN) tablet 1 mg  1 mg Oral Q6H PRN Antonieta Pert, MD   1 mg at 08/25/20 1306   And  . OLANZapine zydis (ZYPREXA) disintegrating tablet 10 mg  10 mg Oral Q8H PRN Antonieta Pert, MD   10 mg at 08/22/20 1114   And  . ziprasidone (GEODON) injection 20 mg  20 mg Intramuscular Q6H PRN Antonieta Pert, MD   20 mg at 08/19/20 1844  . magnesium hydroxide (MILK OF MAGNESIA) suspension 30 mL  30 mL Oral Daily PRN Antonieta Pert, MD      . traZODone (DESYREL) tablet 200 mg  200 mg Oral QHS Antonieta Pert, MD   200 mg at 08/24/20 2108    Lab Results: No results found for this or any previous visit (from the past 48 hour(s)).  Blood Alcohol level:  Lab Results  Component Value Date   ETH <10  10/25/2018   ETH <10 10/10/2018    Metabolic Disorder Labs: Lab Results  Component Value Date   HGBA1C 5.1 08/20/2020   MPG 99.67 08/20/2020   MPG 93.93 10/11/2018   Lab Results  Component Value Date   PROLACTIN 98.3 (H) 10/29/2018   PROLACTIN 52.5 (H) 10/11/2018   Lab Results  Component Value Date   CHOL 86 08/20/2020   TRIG 93 08/20/2020   HDL 23 (L) 08/20/2020   CHOLHDL 3.7 08/20/2020   VLDL 19 08/20/2020   LDLCALC 44 08/20/2020   LDLCALC 54 10/29/2018    Physical Findings: AIMS:  , ,  ,  ,  CIWA:    COWS:     Musculoskeletal: Strength & Muscle Tone: within normal limits Gait & Station: normal Patient leans: N/A  Psychiatric Specialty Exam:  Presentation  General Appearance: Casual; Appropriate for Environment  Eye Contact:Good  Speech:Clear and Coherent  Speech Volume:Normal  Handedness:Right   Mood and Affect  Mood:Euthymic  Affect:Appropriate; Congruent  Thought Process  Thought Processes:Goal Directed; Coherent; Linear  Descriptions of Associations:Intact  Orientation:Full (Time, Place and Person)  Thought Content:Logical  History of Schizophrenia/Schizoaffective disorder:Yes  Duration of Psychotic Symptoms:Greater than six months  Hallucinations:Hallucinations: None  Ideas of Reference:None  Suicidal Thoughts:Suicidal Thoughts: No  Homicidal Thoughts:Homicidal Thoughts: No  Sensorium  Memory:Immediate Good; Recent Fair; Remote Good  Judgment:Fair  Insight:Fair; Present  Executive Functions  Concentration:Fair  Attention Span:Good  Recall:Good  Fund of Knowledge:Good  Language:Fair  Psychomotor Activity  Psychomotor Activity:Psychomotor Activity: Normal  Assets  Assets:Communication Skills; Desire for Improvement; Financial Resources/Insurance; Housing; Intimacy; Leisure Time; Physical Health; Resilience; Social Support; Vocational/Educational  Sleep  Sleep:Sleep: Good  Physical Exam: Physical  Exam Vitals and nursing note reviewed.  Psychiatric:        Attention and Perception: Attention and perception normal.        Mood and Affect: Mood and affect normal.        Speech: Speech normal.        Behavior: Behavior normal. Behavior is cooperative.        Thought Content: Thought content normal.        Cognition and Memory: Cognition and memory normal.        Judgment: Judgment normal.    Review of Systems  Psychiatric/Behavioral: Negative.   All other systems reviewed and are negative.  Blood pressure 112/74, pulse (!) 131, temperature 98.6 F (37 C), temperature source Oral, resp. rate 18, height 5\' 5"  (1.651 m), weight 113.4 kg, SpO2 100 %. Body mass index is 41.6 kg/m.  Treatment Plan Summary: Daily contact with patient to assess and evaluate symptoms and progress in treatment and Medication management  PLAN: 1. Safety and Monitoring: -- Involuntary admission to inpatient psychiatric unit for safety,              stabilization and treatment -- Daily contact with patient to assess and evaluate symptoms              and progress in treatment -- Patient's case to be discussed in multi-disciplinary team              meeting -- Observation Level : q15 minute checks -- Vital signs: q12 hours -- Precautions: suicide  2. Psychiatric Diagnoses and Treatment:  Schizoaffective d/o bipolar type by hx -- Received Haldol dec 100mg  IM on 08/20/20 -- Change Haldol to 5mg  po bid and 10mg  qhs to spread dose throughout the dayas oral bridging as LAI becomes therapeutic -- Continue Trazodone 200mg  qhs for sleep and Benadryl 50mg  po qhs for sleep -- Cogentin 1mg  bid PRN EPS/tremor --IncreaseVPA ER to 1000mg  poqhs(08/23/20: VPA level 26; WBC 5.8, H/H 12.3/38.9 platelets 297; AST 27 and ALT 30 with remainder of hepatic function panel WNL) Rechecking labs tomorrow after VPA dose increase --  Continue Zyprexa zydis agitation protocol -- Vistaril 25mg  q6 hours PRN anxiety -- Metabolic profile and EKG monitoring obtained while on an              atypical antipsychotic (BMI:41.60; Lipid Panel:cholesterol 86,              triglycerides 93, HDL 23, LDL  44; HbgA1c:5.1;              QTc:43674ms)EKG shows NSR 83bpm with QTc 474ms -- Encouraged patient to participate in unit milieu and in              scheduled group therapies  -- Short Term Goals: Ability to demonstrate self-control will              improve and Ability to identify and develop effective coping              behaviors will improve -- Long Term Goals: Improvement in symptoms so as ready for              discharge  High Risk Medication Use: -- The complexity of this patient's case involves drug therapy  with Depakote which requires intensive monitoring for toxicity.  This is in part due to a narrow therapeutic window as well as  potential for toxicity with this medication.   Cannabis used/o -- UDS positive for THC -- Counseled on need to abstain from illicit substances  3. Medical Issues Being Addressed:  HTN -- Continue clonidine 0.1mg  q8 hours PRN SBP >150 or DBP              >100   Hypokalemia - resolved -- Repeat BMP 08/22/20: shows K+ 3.8  Hyponatremia - resolved -- Repeat BMP 08/22/20: shows Na+ 140  Elevated Creatinine (1.22) - resolved -- Repeat BMP 08/22/20: shows creatinine 0.78 with BUN 5 -- po hydration encouraged  Leukocytosis on admission (WBC 12.5) - resolved --08/23/20: WBC 5.8  4. Discharge Planning:  -- Social work and case management to assist with discharge              planning and identification of hospital follow-up needs prior to               discharge -- Estimated LOS: 1-2 days -- Discharge Concerns: Need to establish a safety plan;              Medication compliance and effectiveness -- Discharge Goals: Return home with outpatient referrals for              mental health follow-up including medication              management/psychotherapy  Loletta ParishBrooke A Leevy-Johnson, NP 08/25/2020, 2:18 PM

## 2020-08-25 NOTE — BHH Suicide Risk Assessment (Addendum)
[facility] Discharge Suicide Risk Assessment   Principal Problem: Schizoaffective disorder, bipolar type [facility]) Discharge Diagnoses: Principal Problem:   Schizoaffective disorder, bipolar type (HCC) Active Problems:   Marijuana abuse  Total Time Spent in Direct Patient Care:  I personally spent 30 minutes on the unit in direct patient care. The direct patient care time included face-to-face time with the patient, reviewing the patient's chart, communicating with other professionals, and coordinating care. Greater than 50% of this time was spent in counseling or coordinating care with the patient regarding goals of hospitalization, psycho-education, and discharge planning needs.  Subjective: Nursing notes reviewed and patient was calm and cooperative yesterday without behavioral issues, agitation, or acute mania. Patient was seen on rounds and reports stable mood today. She denies feeling racing thoughts, mood elevation, or grandiosity. She denies medication side-effects. She denies SI, HI, AVH, or paranoia and voices no physical complaints. She does not make delusional statements today and denies ideas of reference or first rank symptoms. She reports improved sleep and per nursing slept 6.75 hours overnight. She reports good appetite. We discussed the need to remain on Haldol oral until her Haldol decanoate shot has time to reach therapeutic effect. It was advised that at her outpatient mental health appointment, it can be discussed about when to start tapering off her oral Haldol, and she was advised that she is due for next Haldol decanoate injection on 09/17/20. She was reminded that she will need ongoing metabolic lab, EKG, weight, and CBC monitoring while on Haldol. She was also reminded that she should not use excessive Tylenol while taking Depakote. She was reminded she will need ongoing liver panel, CBC, weight, and Depakote level monitoring while on Depakote. She was told she needs birth control or to  avoid pregnancy while on Depakote due to the teratogenic effects of this drug in pregnancy. She verbalized understanding of instructions and time was given for questions.   Labs 08/26/20: WBC 7.4, H/H 12.7/40.4, platelets 311; AST 21, ALT 27 and hepatic function panel WNL except for total bili 0.1; VPA level 53  Musculoskeletal: Strength & Muscle Tone: within normal limits Gait & Station: normal, steady Patient leans: N/A  Psychiatric Specialty Exam: Review of Systems  Blood pressure 137/65, pulse 90, temperature 98.3 F (36.8 C), temperature source Oral, resp. rate 18, height 5\' 5"  (1.651 m), weight 113.4 kg, SpO2 100 %.Body mass index is 41.6 kg/m.  General Appearance: casually dressed, adequate hygiene  Eye Contact::  Good  Speech:  Clear and Coherent and Normal Rate  Volume:  Normal  Mood:  Euthymic  Affect:  Appropriate and Congruent  Thought Process:  Coherent, Goal Directed and Linear  Orientation:  Full (Time, Place, and Person)  Thought Content:  Logical and makes no delusional statesments, denies AVH, paranoia, ideas of reference, or first rank symptoms; no acute psychosis on exam  Suicidal Thoughts:  No  Homicidal Thoughts:  No  Memory:  Recent;   Fair  Judgement:  Fair  Insight:  Fair  Psychomotor Activity:  Normal  Concentration:  Good  Recall:  Fair  Fund of Knowledge:Good  Language: Good  Akathisia:  Negative  AIMS (if indicated):   0 on 08/25/20  Assets:  Communication Skills Desire for Improvement Housing Resilience Social Support  Sleep:  Number of Hours: 6.75  Cognition: WNL  ADL's:  Intact   Mental Status Per Nursing Assessment::   On Admission:  Acute mania and psychosis on admission  Demographic Factors:  Young adult, gay orientation  Loss Factors: NA  Historical Factors: Impulsivity; previous mental health diagnosis/treatment; substance use prior to admission; h/o past trauma  Risk Reduction Factors:   Sense of responsibility to family,  Employed, Living with another person, especially a relative and Positive social support  Continued Clinical Symptoms:  Schizoaffective bipolar type diagnosis Substance use prior to admission  Cognitive Features That Contribute To Risk:  None    Suicide Risk:  Minimal: No identifiable suicidal ideation.  Patients presenting with no risk factors but with morbid ruminations; may be classified as minimal risk based on the severity of the depressive symptoms   Follow-up Information    [facility] up on 08/24/2020.   Why: You have an appointment for therapy services on 08/24/20 at 10:00 am.   This appointment will be Virtual. * Please have your discharge summary from this hospitalization available for your appointment.  Contact information: [address] [place], [place] [address]  Phone: [phone], [phone] Fax:        [facility] Go on 09/13/2020.   Why: You have an appointment for medication management on 09/13/20 at 11:00 am.  This appointment will be held in person. Contact information: [address] [place] [address] [phone]        [facility] Follow up.   Specialty: Family Medicine Why: You have a hospital follow up appointment for primary care services on   This appointment will be held in person.  Contact information: [place] [facility] [address] [phone]              Plan Of Care/Follow-up recommendations:  Activity:  as tolerated Diet:  heart healthy Other:  Patient was advised to keep scheduled outpatient mental health appointments for therapy and med management without fail. She was advised to be compliant with scheduled medications and to have ongoing CBC, Depakote drug level, EKG, glucose, weight, lipid panel, and liver function panels checked while on Haldol and Depakote. She was advised NOT to use excessive Tylenol or get pregnant while  on Depakote. The need for birth control was stressed. She received her Haldol Decanoate 100mg  IM injection on 08/20/20 and is due for her next monthly injection on 09/17/20. She was advised that at her follow up appointment her outpatient mental health provider can discuss when to safely start tapering her off oral Haldol as her long-acting injectable reaches steady state. She was counseled on the need to abstain from illicit substances after discharge.   09/19/20, MD, FAPA 08/26/2020, 9:30 AM

## 2020-08-25 NOTE — Progress Notes (Addendum)
   08/25/20 1806  Psych Admission Type (Psych Patients Only)  Admission Status Involuntary  Psychosocial Assessment  Patient Complaints Anxiety  Eye Contact Fair  Facial Expression Animated  Affect Appropriate to circumstance  Speech Logical/coherent  Interaction Assertive  Motor Activity Other (Comment) (WNL)  Appearance/Hygiene Improved  Behavior Characteristics Cooperative;Anxious  Mood Pleasant  Thought Process  Coherency Circumstantial  Content WDL  Delusions None reported or observed  Perception WDL  Hallucination None reported or observed  Judgment Poor  Confusion None  Danger to Self  Current suicidal ideation? Denies  Danger to Others  Danger to Others None reported or observed  D. Pt has been calm and cooperative throughout the shift, although she became very anxious earlier in the shift before she had talked to a Provider, worrying that she may not be going home. Pt has been visible on the unit interacting well with peers and staff, and attending groups. Pt has been compliant with her medications, and has verbally expressed her understanding that she needs to continue them.  Pt currently denies SI/HI and AVH   A. Labs and vitals monitored. Pt administered scheduled medications,  and was given prn medsfor her anxiety.  Pt supported emotionally and encouraged to express concerns and ask questions.   R. Pt remains safe with 15 minute checks. Will continue POC.

## 2020-08-25 NOTE — BHH Counselor (Signed)
Clinical Social Work Note  At [employer] request, CSW called patient's fiancee Florinda Marker [phone]) and asked how she feels that patient is doing, based on their daily phone calls.  She stated that patient is much better, asked about her sleep.  CSW informed her that patient slept 6.75 hours last night, and that pleased fiancee.  She does feel the patient will be ready to come in the next day or so, and is happy to convey the news to patient's mother.  Mother will be picking patient up to transport home.   Ambrose Mantle, LCSW 08/25/2020, 10:53 AM

## 2020-08-25 NOTE — Plan of Care (Addendum)
I spoke to the patient about the need for birth control and the need to avoid pregnancy while on VPA due to the potential teratogenic effects of the medication. She verbalized understanding of discussed risks. She states she and her female partner hope to have children in the future, and I advised she would need to coordinate this with her outpatient psychiatrist and Ob-Gyn to come off VPA prior to attempts to conceive.   AIMS today 0 on my exam; no cogwheeling, stiffness, or tremors. Bartholomew Crews, MD, [facility]

## 2020-08-26 LAB — HEPATIC FUNCTION PANEL
ALT: 27 U/L (ref 0–44)
AST: 21 U/L (ref 15–41)
Albumin: 3.6 g/dL (ref 3.5–5.0)
Alkaline Phosphatase: 62 U/L (ref 38–126)
Bilirubin, Direct: <0.1 mg/dL (ref 0.0–0.2)
Total Bilirubin: 0.1 mg/dL — ABNORMAL LOW (ref 0.3–1.2)
Total Protein: 7.6 g/dL (ref 6.5–8.1)

## 2020-08-26 LAB — CBC WITH DIFFERENTIAL/PLATELET
Abs Immature Granulocytes: 0.01 10*3/uL (ref 0.00–0.07)
Basophils Absolute: 0 10*3/uL (ref 0.0–0.1)
Basophils Relative: 0 %
Eosinophils Absolute: 0.5 10*3/uL (ref 0.0–0.5)
Eosinophils Relative: 7 %
HCT: 40.4 % (ref 36.0–46.0)
Hemoglobin: 12.7 g/dL (ref 12.0–15.0)
Immature Granulocytes: 0 %
Lymphocytes Relative: 58 %
Lymphs Abs: 4.3 10*3/uL — ABNORMAL HIGH (ref 0.7–4.0)
MCH: 26.3 pg (ref 26.0–34.0)
MCHC: 31.4 g/dL (ref 30.0–36.0)
MCV: 83.6 fL (ref 80.0–100.0)
Monocytes Absolute: 0.6 10*3/uL (ref 0.1–1.0)
Monocytes Relative: 9 %
Neutro Abs: 1.9 10*3/uL (ref 1.7–7.7)
Neutrophils Relative %: 26 %
Platelets: 311 10*3/uL (ref 150–400)
RBC: 4.83 MIL/uL (ref 3.87–5.11)
RDW: 16.4 % — ABNORMAL HIGH (ref 11.5–15.5)
WBC: 7.4 10*3/uL (ref 4.0–10.5)
nRBC: 0 % (ref 0.0–0.2)

## 2020-08-26 LAB — VALPROIC ACID LEVEL: Valproic Acid Lvl: 53 ug/mL (ref 50.0–100.0)

## 2020-08-26 MED ORDER — HALOPERIDOL 10 MG PO TABS: 10.0000 mg | ORAL_TABLET | Freq: Every day | ORAL | 0 refills | Status: DC

## 2020-08-26 MED ORDER — FLUTICASONE PROPIONATE 50 MCG/ACT NA SUSP: 1.0000 | Freq: Every day | NASAL | 0 refills | Status: DC

## 2020-08-26 MED ORDER — BENZTROPINE MESYLATE 1 MG PO TABS: 1.0000 mg | ORAL_TABLET | Freq: Two times a day (BID) | ORAL | 0 refills | Status: DC | PRN

## 2020-08-26 MED ORDER — TRAZODONE HCL 100 MG PO TABS: 200.0000 mg | ORAL_TABLET | Freq: Every day | ORAL | 0 refills | Status: DC

## 2020-08-26 MED ORDER — DIPHENHYDRAMINE HCL 25 MG PO CAPS: 50.0000 mg | ORAL_CAPSULE | Freq: Every evening | ORAL | Status: DC | PRN

## 2020-08-26 MED ORDER — DIVALPROEX SODIUM ER 500 MG PO TB24: 1000.0000 mg | ORAL_TABLET | Freq: Every day | ORAL | 0 refills | Status: DC

## 2020-08-26 MED ORDER — HALOPERIDOL 5 MG PO TABS: 5.0000 mg | ORAL_TABLET | Freq: Two times a day (BID) | ORAL | 0 refills | Status: DC

## 2020-08-26 MED ORDER — DIPHENHYDRAMINE HCL 50 MG PO CAPS
50.0000 mg | ORAL_CAPSULE | Freq: Every day | ORAL | Status: DC
  Filled 2020-08-26 (×2): qty 1

## 2020-08-26 NOTE — Plan of Care (Signed)
Pt was able to identify some helpful coping skills to use when dealing with anger and display some healthy anger management techniques.   Caroll Rancher, LRT/CTRS

## 2020-08-26 NOTE — Discharge Summary (Signed)
Physician Discharge Summary Note  Patient:  Samantha Ashley is an 25 y.o., female MRN:  [id number] DOB:  1996/03/20 Patient phone:  [phone] (home)  Patient address:   [address] [place] [address],  Total Time spent with patient: 30 minutes  Date of Admission:  08/19/2020 Date of Discharge: 08/26/2020  Reason for Admission: (From MD's admission note): Patient is a 25 year old female familiar to me from previous psychiatric admissions with a primary psychiatric diagnosis of schizoaffective disorder; bipolar type. She originally presented on 08/18/2020 with her mother. The patient was a poor historian. She apparently [facility], became agitated and on directable. She ripped banners off the hallway. She was escorted to the [facility] emergency room to take out involuntary commitment paperwork. The patient stated that she had last had her medications sometime last fall. She stated she was unable to remember her psychiatrist nor any of her medications. She was placed under involuntary commitment and then transferred back to our facility. Her last psychiatric hospitalization was on 10/28/2018 at our facility. Her discharge medications at that time included Cogentin, Depakote, Haldol, omega-3 fatty acids, prenatal vitamins, propranolol, Risperdal and trazodone. On admission last night she was placed on clonidine 0.1 mg p.o. 3 times daily for elevated systolic blood pressures and restarted on her haloperidol 5 mg p.o. or IM daily as well as 10 mg p.o. or IM nightly. She is irritable this morning and paranoid. She is requesting discharge. I told her that we would give her the long-acting decanoate injection, and she is agreed to that today.  Evaluation on the unit today: Patient was seen and evaluated. She denies SI/HI/AVH, paranoia and delusions. She reported good sleep and a good appetite. Patient is attending group  therapy and interacting appropriately with staff and peers. Patient is taking her medications as prescribed and has no issues with them. She agrees to continue to take her medications and attend her follow up appointments after discharge. She received Haldol Decanoate 100 mg IM on 08/20/2020 for psychosis. If she decides she would like to continue to receive this long acting injectable she can discuss this with her outpatient provider. Patient has outpatient appointments as listed in her discharge paperwork. Patient is stable for discharge home today.   Principal Problem: Schizoaffective disorder, bipolar type [facility]) Discharge Diagnoses: Principal Problem:   Schizoaffective disorder, bipolar type (HCC) Active Problems:   Marijuana abuse  Past Psychiatric History: See H&P  Past Medical History:  Past Medical History:  Diagnosis Date  . Asthma    No past surgical history on file. Family History:  Family History  Problem Relation Age of Onset  . Healthy Mother   . Healthy Father    Family Psychiatric  History: See H&P Social History:  Social History   Substance and Sexual Activity  Alcohol Use No     Social History   Substance and Sexual Activity  Drug Use Yes  . Types: Marijuana    Social History   Socioeconomic History  . Marital status: Single    Spouse name: Not on file  . Number of children: Not on file  . Years of education: Not on file  . Highest education level: Not on file  Occupational History  . Not on file  Tobacco Use  . Smoking status: Current Every Day Smoker    Types: Cigars  . Smokeless tobacco: Never Used  Substance and Sexual Activity  . Alcohol use: No  .  Drug use: Yes    Types: Marijuana  . Sexual activity: Yes  Other Topics Concern  . Not on file  Social History Narrative  . Not on file   Social Determinants of Health   Financial Resource Strain: Not on file  Food Insecurity: Not on file  Transportation Needs: Not on file  Physical  Activity: Not on file  Stress: Not on file  Social Connections: Not on file    Hospital Course:  After the above admission evaluation, Samantha Ashley's  presenting symptoms were noted. She was recommended for mood stabilization treatments. The medication regimen targeting those presenting symptoms were discussed with her & initiated with her consent. She was placed on Haldol PO and received a Haldol Decanoate IM for her psychosis. Her UDS on arrival to the ED was positive for THC, BAL was negative.  She was however medicated, stabilized & discharged on the medications as listed on her discharge medication lists below. Besides the mood stabilization treatments, Samantha Ashley was also enrolled & participated in the group counseling sessions being offered & held on this unit. She learned coping skills. She presented no other significant pre-existing medical issues that required treatment. She tolerated his treatment regimen without any adverse effects or reactions reported.   During the course of her hospitalization, the 15-minute checks were adequate to ensure patient's safety. Samantha Ashley did not display any dangerous, violent or suicidal behavior on the unit. She interacted with patients & staff appropriately, participated appropriately in the group sessions/therapies. Her medications were addressed & adjusted to meet her needs. She was recommended for outpatient follow-up care & medication management upon discharge to assure continuity of care & mood stability.  At the time of discharge patient is not reporting any acute suicidal/homicidal ideations. She feels more confident about her self-care & in managing his mental health. She currently denies any new issues or concerns. Education and supportive counseling provided throughout her hospital stay & upon discharge.   Today upon her discharge evaluation with the attending psychiatrist, Samantha Ashley shares she is doing well. She denies any other specific concerns. She is  sleeping well. Her appetite is good. She denies other physical complaints. She denies AH/VH, delusional thoughts or paranoia. She does not appear to be responding to any internal stimuli. She feels that her medications have been helpful & is in agreement to continue her current treatment regimen as recommended. She was able to engage in safety planning including plan to return to [facility] or contact emergency services if she feels unable to maintain her own safety or the safety of others. Pt had no further questions, comments, or concerns. She left [facility] with all personal belongings in no apparent distress. Transportation per her fiance' in a private vehicle.   Physical Findings: AIMS:  , ,  ,  ,    CIWA:    COWS:     Musculoskeletal: Strength & Muscle Tone: within normal limits Gait & Station: normal Patient leans: N/A  Psychiatric Specialty Exam:  Presentation  General Appearance: Casual; Appropriate for Environment; Fairly Groomed  Eye Contact:Good  Speech:Clear and Coherent; Normal Rate  Speech Volume:Normal  Handedness:Right  Mood and Affect  Mood:Euthymic  Affect:Appropriate; Congruent  Thought Process  Thought Processes:Goal Directed; Coherent; Linear  Descriptions of Associations:Intact  Orientation:Full (Time, Place and Person)  Thought Content:Logical  History of Schizophrenia/Schizoaffective disorder:Yes  Duration of Psychotic Symptoms:Greater than six months  Hallucinations:Hallucinations: None  Ideas of Reference:None  Suicidal Thoughts:Suicidal Thoughts: No  Homicidal Thoughts:Homicidal Thoughts: No  Sensorium  Memory:Immediate  Good; Recent Fair; Remote Good  Judgment:Fair  Insight:Fair; Present  Executive Functions  Concentration:Fair  Attention Span:Good  Recall:Good  Fund of Knowledge:Good  Language:Fair  Psychomotor Activity  Psychomotor Activity:Psychomotor Activity: Normal  Assets  Assets:Communication Skills; Desire for  Improvement; Financial Resources/Insurance; Housing; Intimacy; Leisure Time; Physical Health; Resilience; Social Support; Vocational/Educational  Sleep  Sleep:Sleep: Good Number of Hours of Sleep: 6.75   Physical Exam: Physical Exam Vitals and nursing note reviewed.  Constitutional:      Appearance: Normal appearance.  HENT:     Head: Normocephalic.  Pulmonary:     Effort: Pulmonary effort is normal.  Musculoskeletal:     Cervical back: Normal range of motion.  Neurological:     Mental Status: She is alert and oriented to person, place, and time.  Psychiatric:        Attention and Perception: She does not perceive auditory or visual hallucinations.        Mood and Affect: Mood normal.        Speech: Speech normal.        Behavior: Behavior normal. Behavior is cooperative.        Thought Content: Thought content normal. Thought content is not paranoid or delusional. Thought content does not include homicidal or suicidal ideation. Thought content does not include homicidal or suicidal plan.        Cognition and Memory: Cognition normal.    Review of Systems  Constitutional: Negative for fever.  HENT: Negative for congestion and sore throat.   Respiratory: Negative for cough and shortness of breath.   Cardiovascular: Negative for chest pain.  Gastrointestinal: Negative.   Genitourinary: Negative.   Musculoskeletal: Negative.   Neurological: Negative.    Blood pressure 137/65, pulse 90, temperature 98.3 F (36.8 C), temperature source Oral, resp. rate 18, height 5\' 5"  (1.651 m), weight 113.4 kg, SpO2 100 %. Body mass index is 41.6 kg/m.      Has this patient used any form of tobacco in the last 30 days? (Cigarettes, Smokeless Tobacco, Cigars, and/or Pipes) Yes, N/A  Blood Alcohol level:  Lab Results  Component Value Date   ETH <10 10/25/2018   ETH <10 10/10/2018    Metabolic Disorder Labs:  Lab Results  Component Value Date   HGBA1C 5.1 08/20/2020   MPG 99.67  08/20/2020   MPG 93.93 10/11/2018   Lab Results  Component Value Date   PROLACTIN 98.3 (H) 10/29/2018   PROLACTIN 52.5 (H) 10/11/2018   Lab Results  Component Value Date   CHOL 86 08/20/2020   TRIG 93 08/20/2020   HDL 23 (L) 08/20/2020   CHOLHDL 3.7 08/20/2020   VLDL 19 08/20/2020   LDLCALC 44 08/20/2020   LDLCALC 54 10/29/2018    See Psychiatric Specialty Exam and Suicide Risk Assessment completed by Attending Physician prior to discharge.  Discharge destination:  Home  Is patient on multiple antipsychotic therapies at discharge:  No  Do you recommend tapering to monotherapy for antipsychotics?  No   Has Patient had three or more failed trials of antipsychotic monotherapy by history:  Yes,   Antipsychotic medications that previously failed include:   1.  abilify. and 2.  Risperdal.  Recommended Plan for Multiple Antipsychotic Therapies: NA  Discharge Instructions    Diet - low sodium heart healthy   Complete by: As directed    Increase activity slowly   Complete by: As directed      Allergies as of 08/26/2020      Reactions  Coconut Oil Itching      Medication List    STOP taking these medications   ARIPiprazole 5 MG tablet Commonly known as: ABILIFY   metFORMIN 500 MG tablet Commonly known as: GLUCOPHAGE   omega-3 acid ethyl esters 1 g capsule Commonly known as: LOVAZA   ondansetron 4 MG tablet Commonly known as: ZOFRAN   prenatal multivitamin Tabs tablet   propranolol 40 MG tablet Commonly known as: INDERAL   risperiDONE microspheres 50 MG injection Commonly known as: RISPERDAL CONSTA   tetrahydrozoline-zinc 0.05-0.25 % ophthalmic solution Commonly known as: VISINE-AC     TAKE these medications     Indication  albuterol 108 (90 Base) MCG/ACT inhaler Commonly known as: VENTOLIN HFA Inhale 1 puff into the lungs every 6 (six) hours as needed for wheezing or shortness of breath.  Indication: Asthma   benztropine 1 MG tablet Commonly known as:  COGENTIN Take 1 tablet (1 mg total) by mouth 2 (two) times daily as needed for tremors (EPS). What changed:   when to take this  reasons to take this  Indication: Extrapyramidal Reaction caused by Medications   divalproex 500 MG 24 hr tablet Commonly known as: DEPAKOTE ER Take 2 tablets (1,000 mg total) by mouth at bedtime. What changed:   how much to take  how to take this  when to take this  additional instructions  Indication: Manic Phase of Manic-Depression   fluticasone 50 MCG/ACT nasal spray Commonly known as: FLONASE Place 1 spray into both nostrils daily. Start taking on: Aug 27, 2020  Indication: Nonallergic Rhinitis   haloperidol 10 MG tablet Commonly known as: HALDOL Take 1 tablet (10 mg total) by mouth at bedtime. What changed:   medication strength  how much to take  how to take this  when to take this  additional instructions  Indication: Manic Phase of Manic-Depression   haloperidol 5 MG tablet Commonly known as: HALDOL Take 1 tablet (5 mg total) by mouth 2 (two) times daily. What changed: You were already taking a medication with the same name, and this prescription was added. Make sure you understand how and when to take each.  Indication: Manic Phase of Manic-Depression   Norethindrone Acetate-Ethinyl Estradiol 1.5-30 MG-MCG tablet Commonly known as: LOESTRIN Take 1 tablet by mouth daily.  Indication: Birth Control Treatment   traZODone 100 MG tablet Commonly known as: DESYREL Take 2 tablets (200 mg total) by mouth at bedtime.  Indication: Anxiety Disorder, Trouble Sleeping       Follow-up Information    [facility] Follow up on 08/24/2020.   Why: You have an appointment for therapy services on 08/24/20 at 10:00 am.   This appointment will be Virtual. * Please have your discharge summary from this hospitalization available for your appointment.  Contact information: [address] [place], [place]  [address]  Phone: [phone], [phone] Fax:        [facility] Go on 09/13/2020.   Why: You have an appointment for medication management on 09/13/20 at 11:00 am.  This appointment will be held in person. Contact information: [address] [place] [address] [phone]        [facility]. Go on 10/01/2020.   Specialty: Family Medicine Why: You have a hospital follow up appointment for primary care services on 10/01/20 at 10:30 am.  This appointment will be held in person.  Contact information: [address] [place] [address] [phone]  Follow-up recommendations:  Activity:  as tolerated Diet:  Heart Healthy  Comments:  Prescriptions given at discharge.  Patient agreeable to plan.  Given opportunity to ask questions.  Appears to feel comfortable with discharge denies any current suicidal or homicidal thoughts.   Patient is instructed prior to discharge to: Take all medications as prescribed by her mental healthcare provider. Report any adverse effects and or reactions from the medicines to her outpatient provider promptly. Patient has been instructed & cautioned: To not engage in alcohol and or illegal drug use while on prescription medicines. In the event of worsening symptoms, patient is instructed to call the crisis hotline, 911 and or go to the nearest ED for appropriate evaluation and treatment of symptoms. To follow-up with her primary care provider for your other medical issues, concerns and or health care needs.  Signed: Laveda Abbe, NP 08/26/2020, 10:23 AM

## 2020-08-26 NOTE — Progress Notes (Signed)
  [facility] Adult Case Management Discharge Plan :  Will you be returning to the same living situation after discharge:  Yes,  to live with Fiance At discharge, do you have transportation home?: Yes,  Fiance to pick this patient up Do you have the ability to pay for your medications: Yes,  has insurance  Release of information consent forms completed and in the chart;  Patient's signature needed at discharge.  Patient to Follow up at:  Follow-up Information    [facility] Follow up on 08/24/2020.   Why: You have an appointment for therapy services on 08/24/20 at 10:00 am.   This appointment will be Virtual. * Please have your discharge summary from this hospitalization available for your appointment.  Contact information: [address] [place], [place] [address]  Phone: [phone], [phone] Fax:        [facility] Go on 09/13/2020.   Why: You have an appointment for medication management on 09/13/20 at 11:00 am.  This appointment will be held in person. Contact information: [address] [place] [address] [phone]        [facility]. Go on 10/01/2020.   Specialty: Family Medicine Why: You have a hospital follow up appointment for primary care services on 10/01/20 at 10:30 am.  This appointment will be held in person.  Contact information: [address] [place] [facility] [address] [phone]              Next level of care provider has access to [facility] Link:no  Safety Planning and Suicide Prevention discussed: Yes,  with fiance     Has patient been referred to the Quitline?: N/A patient is not a smoker  Patient has been referred for addiction treatment: N/A  Otelia Santee, LCSW 08/26/2020, 9:47 AM

## 2020-08-26 NOTE — Progress Notes (Signed)
Recreation Therapy Notes  INPATIENT RECREATION TR PLAN  Patient Details Name: Samantha Ashley MRN: [id number] DOB: 1996/04/19 Today's Date: 08/26/2020  Rec Therapy Plan Is patient appropriate for Therapeutic Recreation?: Yes Treatment times per week: about 3 days Estimated Length of Stay: 5-7 days TR Treatment/Interventions: Group participation (Comment)  Discharge Criteria Pt will be discharged from therapy if:: Discharged Treatment plan/goals/alternatives discussed and agreed upon by:: Patient/family  Discharge Summary Short term goals set: See patient care plan Short term goals met: Adequate for discharge Progress toward goals comments: Groups attended Which groups?: Self-esteem,Wellness,Stress management,Other (Comment) (Decision Making, Team Building) Reason goals not met: Pt identifed coping skills not techniques to deal with anger. Therapeutic equipment acquired: N/A Reason patient discharged from therapy: Discharge from hospital Pt/family agrees with progress & goals achieved: Yes Date patient discharged from therapy: 08/26/20    Victorino Sparrow, LRT/CTRS   Ria Comment, Arabi 08/26/2020, 12:02 PM

## 2020-08-26 NOTE — Progress Notes (Signed)
RN met with pt and reviewed pt's discharge instructions.  Pt verbalized understanding of discharge instructions and pt did not have any questions. RN reviewed and provided pt with a copy of SRA, AVS and Transition Record.  RN returned pt's belongings to pt.  Pt denied SI/HI/AVH and voiced no concerns.  Pt was appreciative of the care pt received at [facility].  Patient discharged to the lobby without incident. 

## 2020-08-26 NOTE — Progress Notes (Signed)
Recreation Therapy Notes  Date:  5.2.22 Time: 0947 Location: 300 [address] Dayroom  Group Topic: Stress Management  Goal Area(s) Addresses:  Patient will identify positive stress management techniques. Patient will identify benefits of using stress management post d/c.  Behavioral Response: Engaged  Intervention: Stress Management  Activity: Meditation.  LRT played meditation that focused on being able to free yourself by not holding on to things you can't change.  Patients were to listen and follow as meditation played to engage in activity.   Education:  Stress Management, Discharge Planning.   Education Outcome: Acknowledges Education  Clinical Observations/Feedback: Pt attended and participated in group activity.   Samantha Ashley, LRT/CTRS         Samantha Ashley, Samantha Ashley 08/26/2020 11:01 AM

## 2020-09-08 ENCOUNTER — Ambulatory Visit (HOSPITAL_COMMUNITY): Payer: Self-pay

## 2020-10-01 ENCOUNTER — Encounter (INDEPENDENT_AMBULATORY_CARE_PROVIDER_SITE_OTHER): Payer: Self-pay

## 2020-10-01 ENCOUNTER — Encounter (INDEPENDENT_AMBULATORY_CARE_PROVIDER_SITE_OTHER): Payer: 59 | Admitting: Primary Care

## 2020-10-01 ENCOUNTER — Other Ambulatory Visit: Payer: Self-pay

## 2020-10-01 NOTE — Progress Notes (Signed)
  [facility]   Subjective:   Samantha Ashley is a 25 y.o. female presents for hospital follow up and establish care. Admit date to the hospital was 08/19/20, patient was discharged from the hospital on 08/26/20, patient was admitted for:   Past Medical History:  Diagnosis Date  . Asthma      Allergies  Allergen Reactions  . Coconut Oil Itching      Current Outpatient Medications on File Prior to Visit  Medication Sig Dispense Refill  . albuterol (VENTOLIN HFA) 108 (90 Base) MCG/ACT inhaler Inhale 1 puff into the lungs every 6 (six) hours as needed for wheezing or shortness of breath.    . benztropine (COGENTIN) 1 MG tablet Take 1 tablet (1 mg total) by mouth 2 (two) times daily as needed for tremors (EPS). 30 tablet 0  . divalproex (DEPAKOTE ER) 500 MG 24 hr tablet Take 2 tablets (1,000 mg total) by mouth at bedtime. 60 tablet 0  . fluticasone (FLONASE) 50 MCG/ACT nasal spray Place 1 spray into both nostrils daily. 9.9 mL 0  . haloperidol (HALDOL) 10 MG tablet Take 1 tablet (10 mg total) by mouth at bedtime. 30 tablet 0  . haloperidol (HALDOL) 5 MG tablet Take 1 tablet (5 mg total) by mouth 2 (two) times daily. 60 tablet 0  . Norethindrone Acetate-Ethinyl Estradiol (LOESTRIN) 1.5-30 MG-MCG tablet Take 1 tablet by mouth daily.    . traZODone (DESYREL) 100 MG tablet Take 2 tablets (200 mg total) by mouth at bedtime. 60 tablet 0   No current facility-administered medications on file prior to visit.     Review of System: ROS  Objective:  There were no vitals taken for this visit.  There were no vitals filed for this visit.  Physical Exam:   General Appearance: Well nourished, in no apparent distress. Eyes: PERRLA, EOMs, conjunctiva no swelling or erythema Sinuses: No Frontal/maxillary tenderness ENT/Mouth: Ext aud canals clear, TMs without erythema, bulging. No erythema, swelling, or exudate on post pharynx.  Tonsils not swollen or erythematous. Hearing  normal.  Neck: Supple, thyroid normal.  Respiratory: Respiratory effort normal, BS equal bilaterally without rales, rhonchi, wheezing or stridor.  Cardio: RRR with no MRGs. Brisk peripheral pulses without edema.  Abdomen: Soft, + BS.  Non tender, no guarding, rebound, hernias, masses. Lymphatics: Non tender without lymphadenopathy.  Musculoskeletal: Full ROM, 5/5 strength, normal gait.  Skin: Warm, dry without rashes, lesions, ecchymosis.  Neuro: Cranial nerves intact. Normal muscle tone, no cerebellar symptoms. Sensation intact.  Psych: Awake and oriented X 3, normal affect, Insight and Judgment appropriate.    Assessment:   No diagnosis found.  No orders of the defined types were placed in this encounter.   This note has been created with Education officer, environmental. Any transcriptional errors are unintentional.   Grayce Sessions, NP 10/01/2020, 10:40 AM

## 2020-11-06 ENCOUNTER — Ambulatory Visit (HOSPITAL_COMMUNITY)
Admission: EM | Admit: 2020-11-06 | Discharge: 2020-11-06 | Disposition: A | Discharge status: Home / Self Care | Payer: 59

## 2020-11-06 ENCOUNTER — Other Ambulatory Visit: Payer: Self-pay

## 2020-11-06 DIAGNOSIS — F1210 Cannabis abuse, uncomplicated: Secondary | ICD-10-CM

## 2020-11-06 DIAGNOSIS — F250 Schizoaffective disorder, bipolar type: Secondary | ICD-10-CM

## 2020-11-06 NOTE — Discharge Summary (Signed)
Samantha Ashley to be D/C'd Home per NP order. Discussed with the patient and all questions fully answered. An After Visit Summary was printed and given to the patient.  Patient escorted out and D/C home via private auto.  Dickie La  11/06/2020 11:15 AM

## 2020-11-06 NOTE — Discharge Instructions (Addendum)

## 2020-11-06 NOTE — ED Notes (Addendum)
Called [facility] for transport of pt to home. Awaiting arrival. Safety maintained.

## 2020-11-06 NOTE — ED Provider Notes (Signed)
Behavioral Health Urgent Care Medical Screening Exam  Patient Name: Samantha Ashley MRN: [id number] Date of Evaluation: 11/06/20 Chief Complaint:   Diagnosis:  Final diagnoses:  Schizoaffective disorder, bipolar type (HCC)  Marijuana abuse    History of Present illness: Samantha Ashley is a 25 y.o. female.  Patient presents voluntarily to [facility] behavioral health transported by police.  She reports she called police after "a conversation I had with my mom, she does not understand me."  She reports feeling triggered by instances when people do not understand her.  She reports this is most frequently when speaking with her mother.  Audrea would like to seek family counseling involving her mother, plans to discuss this with her mother.  She reports readiness to discharge home.  She states "my fianc works odd hours, from 2 AM until 8 AM at [employer].  I have difficulty sleeping and it is hard when she is trying to sleep to use my coping skills."  She reports plan to discuss this with her outpatient psychiatry provider scheduled for Friday, 11/08/2020 at 4 PM.  Patient states "I am manic, I have been manic since April 24."  She reports difficulty sleeping x3 days.  She is insightful regarding diagnosis as well as medications.  She is diagnosed with bipolar disorder.  She is followed by outpatient psychiatry, [name] is a health.  Reports she is typically compliant with her lamotrigine and trazodone but "forgot to take all of my medications last night."  She is not currently followed by outpatient psychiatry as she "fired" her therapist approximately 1 week ago. She reports her therapist failed to complete her short-term disability documents in a timely fashion.  She is interested in following up with outpatient therapy and intensive outpatient program at [facility] behavioral health, resources provided.  Patient is assessed by nurse practitioner.  She is seated in assessment room, pleasant and  cooperative during assessment.  She is alert and oriented, answers appropriately.    She denies suicidal and homicidal ideations.  She contracts verbally for safety with this Clinical research associate.  She endorses 1 prior suicide attempt at age 87, she also endorses self-harm behaviors, last episode of self-harm when patient was age 52.  She denies auditory and visual hallucinations.  There is no evidence of delusional thought content and she denies symptoms of paranoia.  She resides in [place] with her fianc.  She reports upcoming wedding in approximately 3 months.  She denies access to weapons.  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  She reports plan to return to work on 11/18/2020.  She endorses rare alcohol use, last use on yesterday.  She reports drinking alcohol less than once per month.  She endorses chronic and daily marijuana use, last use yesterday.  She reports consuming marijuana several times each day.  Patient offered support and encouragement.  She gives verbal consent to speak with her fianc, Marquette Saa phone number [phone].  Spoke with patient's fianc who denies concern for patient safety, she confirms patient does not have access to weapons.  Patient's fianc request that this provider speak with patient regarding chronic marijuana use, assured her this conversation had taken place at length.  Psychiatric Specialty Exam  Presentation  General Appearance:Appropriate for Environment; Casual  Eye Contact:Good  Speech:Clear and Coherent; Normal Rate  Speech Volume:Normal  Handedness:Right   Mood and Affect  Mood:Euthymic  Affect:Appropriate; Congruent   Thought Process  Thought Processes:Coherent; Goal Directed  Descriptions of Associations:Intact  Orientation:Full (Time, Place and Person)  Thought Content:Logical; WDL  Diagnosis of Schizophrenia or Schizoaffective disorder in past: Yes  Duration of Psychotic Symptoms:  Greater than six months  Hallucinations:None reprots she hears her own voice and with racing thoughts  Ideas of Reference:None  Suicidal Thoughts:No  Homicidal Thoughts:No   Sensorium  Memory:Immediate Good; Recent Good; Remote Good  Judgment:Fair  Insight:Fair   Executive Functions  Concentration:Good  Attention Span:Good  Recall:Good  Fund of Knowledge:Good  Language:Good   Psychomotor Activity  Psychomotor Activity:Normal   Assets  Assets:Communication Skills; Desire for Improvement; Financial Resources/Insurance; Housing; Intimacy; Leisure Time; Physical Health; Resilience; Social Support; Talents/Skills   Sleep  Sleep:Poor  Number of hours: 6.75   No data recorded  Physical Exam: Physical Exam Vitals and nursing note reviewed.  Constitutional:      Appearance: Normal appearance. She is well-developed. She is obese.  HENT:     Head: Normocephalic and atraumatic.     Nose: Nose normal.  Cardiovascular:     Rate and Rhythm: Normal rate.  Pulmonary:     Effort: Pulmonary effort is normal.  Musculoskeletal:        General: Normal range of motion.     Cervical back: Normal range of motion.  Neurological:     Mental Status: She is alert and oriented to person, place, and time.  Psychiatric:        Attention and Perception: Attention and perception normal.        Mood and Affect: Mood and affect normal.        Speech: Speech normal.        Behavior: Behavior normal. Behavior is cooperative.        Thought Content: Thought content normal.        Cognition and Memory: Cognition and memory normal.        Judgment: Judgment normal.   Review of Systems  Constitutional: Negative.   HENT: Negative.    Eyes: Negative.   Respiratory: Negative.    Cardiovascular: Negative.   Gastrointestinal: Negative.   Genitourinary: Negative.   Musculoskeletal: Negative.   Skin: Negative.   Neurological: Negative.   Endo/Heme/Allergies: Negative.    Psychiatric/Behavioral:  Positive for substance abuse. The patient has insomnia.   Blood pressure 123/68, pulse 83, temperature 98 F (36.7 C), temperature source Oral, resp. rate 18, SpO2 100 %. There is no height or weight on file to calculate BMI.  Musculoskeletal: Strength & Muscle Tone: within normal limits Gait & Station: normal Patient leans: N/A   [facility] MSE Discharge Disposition for Follow up and Recommendations: Based on my evaluation the patient does not appear to have an emergency medical condition and can be discharged with resources and follow up care in outpatient services for Medication Management, Substance Abuse Intensive Outpatient Program, and Individual Therapy Patient reviewed with Dr Bronwen Betters.  Follow-up with outpatient psychiatry, appointment upcoming. Follow-up with therapy/counseling and intensive outpatient resources provided. Continue current medications.   Lenard Lance, FNP 11/06/2020, 10:50 AM

## 2020-11-07 ENCOUNTER — Ambulatory Visit (HOSPITAL_COMMUNITY)
Admission: EM | Admit: 2020-11-07 | Discharge: 2020-11-07 | Disposition: A | Discharge status: Other Acute Inpatient Hospital | Payer: 59

## 2020-11-07 ENCOUNTER — Emergency Department (HOSPITAL_COMMUNITY)
Admission: EM | Admit: 2020-11-07 | Discharge: 2020-11-09 | Disposition: A | Discharge status: Other Acute Inpatient Hospital | Payer: 59 | Source: Home / Self Care | Attending: Emergency Medicine | Admitting: Emergency Medicine

## 2020-11-07 ENCOUNTER — Ambulatory Visit (HOSPITAL_COMMUNITY): Admission: EM | Admit: 2020-11-07 | Discharge: 2020-11-07 | Discharge status: Against Medical Advice | Payer: 59

## 2020-11-07 ENCOUNTER — Other Ambulatory Visit: Payer: Self-pay

## 2020-11-07 DIAGNOSIS — Z7951 Long term (current) use of inhaled steroids: Secondary | ICD-10-CM | POA: Insufficient documentation

## 2020-11-07 DIAGNOSIS — F17290 Nicotine dependence, other tobacco product, uncomplicated: Secondary | ICD-10-CM | POA: Insufficient documentation

## 2020-11-07 DIAGNOSIS — R45851 Suicidal ideations: Secondary | ICD-10-CM | POA: Diagnosis not present

## 2020-11-07 DIAGNOSIS — J45909 Unspecified asthma, uncomplicated: Secondary | ICD-10-CM | POA: Insufficient documentation

## 2020-11-07 DIAGNOSIS — F29 Unspecified psychosis not due to a substance or known physiological condition: Secondary | ICD-10-CM | POA: Insufficient documentation

## 2020-11-07 DIAGNOSIS — Z79899 Other long term (current) drug therapy: Secondary | ICD-10-CM | POA: Insufficient documentation

## 2020-11-07 DIAGNOSIS — F250 Schizoaffective disorder, bipolar type: Secondary | ICD-10-CM

## 2020-11-07 DIAGNOSIS — Z20822 Contact with and (suspected) exposure to covid-19: Secondary | ICD-10-CM | POA: Insufficient documentation

## 2020-11-07 DIAGNOSIS — R4689 Other symptoms and signs involving appearance and behavior: Secondary | ICD-10-CM

## 2020-11-07 LAB — CBC WITH DIFFERENTIAL/PLATELET
Abs Immature Granulocytes: 0.04 10*3/uL (ref 0.00–0.07)
Basophils Absolute: 0 10*3/uL (ref 0.0–0.1)
Basophils Relative: 0 %
Eosinophils Absolute: 0.1 10*3/uL (ref 0.0–0.5)
Eosinophils Relative: 1 %
HCT: 37.5 % (ref 36.0–46.0)
Hemoglobin: 12.4 g/dL (ref 12.0–15.0)
Immature Granulocytes: 0 %
Lymphocytes Relative: 33 %
Lymphs Abs: 3.5 10*3/uL (ref 0.7–4.0)
MCH: 27.9 pg (ref 26.0–34.0)
MCHC: 33.1 g/dL (ref 30.0–36.0)
MCV: 84.5 fL (ref 80.0–100.0)
Monocytes Absolute: 0.7 10*3/uL (ref 0.1–1.0)
Monocytes Relative: 6 %
Neutro Abs: 6.2 10*3/uL (ref 1.7–7.7)
Neutrophils Relative %: 60 %
Platelets: 317 10*3/uL (ref 150–400)
RBC: 4.44 MIL/uL (ref 3.87–5.11)
RDW: 15.4 % (ref 11.5–15.5)
WBC: 10.6 10*3/uL — ABNORMAL HIGH (ref 4.0–10.5)
nRBC: 0 % (ref 0.0–0.2)

## 2020-11-07 LAB — COMPREHENSIVE METABOLIC PANEL
ALT: 18 U/L (ref 0–44)
AST: 29 U/L (ref 15–41)
Albumin: 4 g/dL (ref 3.5–5.0)
Alkaline Phosphatase: 55 U/L (ref 38–126)
Anion gap: 12 (ref 5–15)
BUN: 9 mg/dL (ref 6–20)
CO2: 17 mmol/L — ABNORMAL LOW (ref 22–32)
Calcium: 9.4 mg/dL (ref 8.9–10.3)
Chloride: 103 mmol/L (ref 98–111)
Creatinine, Ser: 1.02 mg/dL — ABNORMAL HIGH (ref 0.44–1.00)
GFR, Estimated: >60 mL/min (ref >60)
Glucose, Bld: 77 mg/dL (ref 70–99)
Potassium: 3.9 mmol/L (ref 3.5–5.1)
Sodium: 132 mmol/L — ABNORMAL LOW (ref 135–145)
Total Bilirubin: 0.6 mg/dL (ref 0.3–1.2)
Total Protein: 8.6 g/dL — ABNORMAL HIGH (ref 6.5–8.1)

## 2020-11-07 LAB — SALICYLATE LEVEL: Salicylate Lvl: <7 mg/dL — ABNORMAL LOW (ref 7.0–30.0)

## 2020-11-07 LAB — RESP PANEL BY RT-PCR (FLU A&B, COVID) ARPGX2
Influenza A by PCR: NEGATIVE
Influenza B by PCR: NEGATIVE
SARS Coronavirus 2 by RT PCR: NEGATIVE

## 2020-11-07 LAB — ACETAMINOPHEN LEVEL: Acetaminophen (Tylenol), Serum: <10 ug/mL — ABNORMAL LOW (ref 10–30)

## 2020-11-07 LAB — ETHANOL: Alcohol, Ethyl (B): <10 mg/dL (ref <10)

## 2020-11-07 LAB — I-STAT BETA HCG BLOOD, ED (MC, WL, AP ONLY): I-stat hCG, quantitative: <5 m[IU]/mL (ref <5)

## 2020-11-07 MED ORDER — TRAZODONE HCL 50 MG PO TABS
50.0000 mg | ORAL_TABLET | Freq: Every day | ORAL | Status: DC
  Administered 2020-11-08 (×2): 50 mg via ORAL
  Filled 2020-11-07 (×2): qty 1

## 2020-11-07 MED ORDER — OLANZAPINE 5 MG PO TBDP
5.0000 mg | ORAL_TABLET | Freq: Two times a day (BID) | ORAL | Status: DC
  Administered 2020-11-08 – 2020-11-09 (×3): 5 mg via ORAL
  Filled 2020-11-07 (×4): qty 1

## 2020-11-07 MED ORDER — ZIPRASIDONE MESYLATE 20 MG IM SOLR
INTRAMUSCULAR | Status: AC
  Administered 2020-11-07: 20 mg via INTRAMUSCULAR
  Filled 2020-11-07: qty 20

## 2020-11-07 MED ORDER — ZIPRASIDONE MESYLATE 20 MG IM SOLR: 20.0000 mg | Freq: Once | INTRAMUSCULAR | Status: AC

## 2020-11-07 MED ORDER — METFORMIN HCL 500 MG PO TABS
500.0000 mg | ORAL_TABLET | Freq: Two times a day (BID) | ORAL | Status: DC
  Administered 2020-11-07 – 2020-11-09 (×4): 500 mg via ORAL
  Filled 2020-11-07 (×4): qty 1

## 2020-11-07 MED ORDER — OLANZAPINE 10 MG IM SOLR
5.0000 mg | Freq: Two times a day (BID) | INTRAMUSCULAR | Status: DC
  Administered 2020-11-07: 5 mg via INTRAMUSCULAR
  Filled 2020-11-07 (×2): qty 10

## 2020-11-07 MED ORDER — STERILE WATER FOR INJECTION IJ SOLN
INTRAMUSCULAR | Status: AC
  Administered 2020-11-07: 1.2 mL
  Filled 2020-11-07: qty 10

## 2020-11-07 MED ORDER — LORAZEPAM 2 MG/ML IJ SOLN
2.0000 mg | Freq: Once | INTRAMUSCULAR | Status: AC
  Administered 2020-11-07: 2 mg via INTRAMUSCULAR
  Filled 2020-11-07: qty 1

## 2020-11-07 MED ORDER — LAMOTRIGINE 100 MG PO TABS
100.0000 mg | ORAL_TABLET | Freq: Every day | ORAL | Status: DC
  Administered 2020-11-08 – 2020-11-09 (×2): 100 mg via ORAL
  Filled 2020-11-07 (×2): qty 1

## 2020-11-07 NOTE — ED Triage Notes (Signed)
Pt from [facility] BIB [employer] for being combative and aggressive. Pt arrived screaming and yelling at [facility] and staff. Pt denies SI/HI. Pt report marijuana use; unknown last use.

## 2020-11-07 NOTE — ED Provider Notes (Signed)
[name] EMERGENCY DEPARTMENT Provider Note   CSN: [id number] Arrival date & time: 11/07/20  1647     History IVC, Psychosis   NOELA BROTHERS is a 25 y.o. female here for evaluation of psychosis under IVC taken out by behavioral health urgent care providers.  History of schizophrenia, substance use.  Patient does not answer questions, she is aggressive, apears to be responding to internal stimuli.  Low 5 caveat- Psychosis  According to [employer] patient presented for evaluation at [facility] for hallucinations, delusions  According to collateral from patient's mother via [facility] note>>Tonya Maiolo, Mother patient has had worsening behavior, has not slept in many days.  She is aggressive.  She has history of admissions for similar presentations.  Family unsure if patient taking medications  HPI     Past Medical History:  Diagnosis Date   Asthma     Patient Active Problem List   Diagnosis Date Noted   Marijuana abuse 08/23/2020   Bipolar 1 disorder, depressed (HCC) 10/28/2018   Bipolar depression (HCC) 10/15/2018   Schizoaffective disorder, bipolar type (HCC) 10/10/2018   Schizophrenia (HCC) 10/10/2018    No past surgical history on file.   OB History   No obstetric history on file.     Family History  Problem Relation Age of Onset   Healthy Mother    Healthy Father     Social History   Tobacco Use   Smoking status: Every Day    Types: Cigars   Smokeless tobacco: Never  Substance Use Topics   Alcohol use: No   Drug use: Yes    Types: Marijuana    Home Medications Prior to Admission medications   Medication Sig Start Date End Date Taking? Authorizing Provider  albuterol (VENTOLIN HFA) 108 (90 Base) MCG/ACT inhaler Inhale 1 puff into the lungs every 6 (six) hours as needed for wheezing or shortness of breath.    [provider]  benztropine (COGENTIN) 1 MG tablet Take 1 tablet (1 mg total) by mouth 2 (two) times daily as needed for  tremors (EPS). 08/26/20   Laveda Abbe, NP  divalproex (DEPAKOTE ER) 500 MG 24 hr tablet Take 2 tablets (1,000 mg total) by mouth at bedtime. 08/26/20   Laveda Abbe, NP  fluticasone [facility]) 50 MCG/ACT nasal spray Place 1 spray into both nostrils daily. 08/27/20   Laveda Abbe, NP  haloperidol (HALDOL) 10 MG tablet Take 1 tablet (10 mg total) by mouth at bedtime. 08/26/20   Laveda Abbe, NP  haloperidol (HALDOL) 5 MG tablet Take 1 tablet (5 mg total) by mouth 2 (two) times daily. 08/26/20   Laveda Abbe, NP  Norethindrone Acetate-Ethinyl Estradiol (LOESTRIN) 1.5-30 MG-MCG tablet Take 1 tablet by mouth daily.    [provider]  traZODone (DESYREL) 100 MG tablet Take 2 tablets (200 mg total) by mouth at bedtime. 08/26/20   Laveda Abbe, NP    Allergies    Coconut oil  Review of Systems   Review of Systems  Unable to perform ROS: Psychiatric disorder  All other systems reviewed and are negative.  Physical Exam Updated Vital Signs BP 116/77 (BP Location: Right Arm)   Pulse 73   Temp 98.1 F (36.7 C) (Oral)   Resp 16   SpO2 100%   Physical Exam Vitals and nursing note reviewed.  Constitutional:      Appearance: She is well-developed. She is obese.  HENT:     Head: Atraumatic.  Eyes:  Pupils: Pupils are equal, round, and reactive to light.  Cardiovascular:     Rate and Rhythm: Normal rate.  Pulmonary:     Effort: No respiratory distress.     Comments: No obvious respiratory distress, speaks in full sentences without difficulty Abdominal:     General: There is no distension.  Musculoskeletal:        General: Normal range of motion.     Cervical back: Normal range of motion.     Comments: Moves all 4 extremities without difficulty  Skin:    General: Skin is warm and dry.  Neurological:     General: No focal deficit present.     Mental Status: She is alert.     Comments: Ambulatory in room without difficulty   Psychiatric:        Mood and Affect: Mood normal. Affect is labile and angry.        Speech: Speech is rapid and pressured.        Behavior: Behavior is agitated, aggressive and combative.        Thought Content: Thought content is paranoid and delusional.     Comments: Patient agitated, aggressive.  Appears to be responding to internal stimuli.  Will not answer many questions.  She appears paranoid and delusional.  Rapid and pressured speech.    ED Results / Procedures / Treatments   Labs (all labs ordered are listed, but only abnormal results are displayed) Labs Reviewed  CBC WITH DIFFERENTIAL/PLATELET - Abnormal; Notable for the following components:      Result Value   WBC 10.6 (*)    All other components within normal limits  COMPREHENSIVE METABOLIC PANEL - Abnormal; Notable for the following components:   Sodium 132 (*)    CO2 17 (*)    Creatinine, Ser 1.02 (*)    Total Protein 8.6 (*)    All other components within normal limits  RESP PANEL BY RT-PCR (FLU A&B, COVID) ARPGX2  ACETAMINOPHEN LEVEL  ETHANOL  SALICYLATE LEVEL  RAPID URINE DRUG SCREEN, HOSP PERFORMED  LAMOTRIGINE LEVEL  I-STAT BETA HCG BLOOD, ED ([facility], [facility], [facility] ONLY)    EKG None  Radiology No results found.  Procedures .Critical Care  Date/Time: 11/07/2020 7:14 PM Performed by: Linwood Dibbles, PA-C Authorized by: Linwood Dibbles, PA-C   Critical care provider statement:    Critical care time (minutes):  45   Critical care was necessary to treat or prevent imminent or life-threatening deterioration of the following conditions:  Toxidrome   Critical care was time spent personally by me on the following activities:  Discussions with consultants, evaluation of patient's response to treatment, examination of patient, ordering and performing treatments and interventions, ordering and review of laboratory studies, ordering and review of radiographic studies, pulse oximetry, re-evaluation of patient's  condition, obtaining history from patient or surrogate and review of old charts   Medications Ordered in ED Medications  OLANZapine zydis (ZYPREXA) disintegrating tablet 5 mg (has no administration in time range)    Or  OLANZapine (ZYPREXA) injection 5 mg (has no administration in time range)  metFORMIN (GLUCOPHAGE) tablet 500 mg (has no administration in time range)  lamoTRIgine (LAMICTAL) tablet 100 mg (has no administration in time range)  traZODone (DESYREL) tablet 50 mg (has no administration in time range)  LORazepam (ATIVAN) injection 2 mg (has no administration in time range)  ziprasidone (GEODON) injection 20 mg (20 mg Intramuscular Given 11/07/20 1709)  sterile water (preservative free) injection (1.2 mLs  Given 11/07/20 1709)    ED Course  I have reviewed the triage vital signs and the nursing notes.  Pertinent labs & imaging results that were available during my care of the patient were reviewed by me and considered in my medical decision making (see chart for details).  Patient here for evaluation under IVC.  Has hx of Schizophrenia. Patient appears to be psychotic. Actively responding to internal stimuli. She is agitated and aggressive. Unable to verbally deescalate. Requiring chemical sedation as aggression here with [employer] and staff. Seen by [facility] prior to tx and IVC placed by Psych team. Will plan on medical screening labs.   Patient is still aggressive, agitated, responding to internal stimuli despite Geodon.  Will give additional IM medications.  Psychiatry is written for as needed Zyprexa.   Patient was seen by Effie Shy, NP while at behavioral health.  She messaged me stating that patient recommended for inpatient admission after she is medically cleared.  Attending Dr., Lynelle Doctor will FU on remaining labs.     MDM Rules/Calculators/A&P                           Final Clinical Impression(s) / ED Diagnoses Final diagnoses:  Psychosis, unspecified psychosis type (HCC)   Aggressive behavior    Rx / DC Orders ED Discharge Orders     None        Roslynn Holte A, PA-C 11/07/20 1914    Linwood Dibbles, MD 11/10/20 [phone]

## 2020-11-07 NOTE — ED Provider Notes (Addendum)
Behavioral Health Urgent Care Medical Screening Exam  Patient Name: Samantha Ashley MRN: [id number] Date of Evaluation: 11/07/20 Chief Complaint:   Diagnosis:  Final diagnoses:  Schizoaffective disorder, bipolar type (HCC)    History of Present illness: Samantha Ashley is a 25 y.o. female patient presented to [facility] as a walk in accompanied by IVC with a [employer] with complaints of " this is what they do to you they say they taking you to one place and then take you to another"   Samantha Ashley, 25 y.o., female patient seen face to face by this provider, consulted with Dr. Bronwen Betters; and chart reviewed on 11/07/20.  Samantha Ashley has a psychiatric history of schizoaffective disorder, bipolar type.  During evaluation Samantha Ashley is walking around the room.  She is hitting the wall and the door she takes her shoes off and throws them.  She is throwing her cup of water and tearing up the Styrofoam cup and throwing it. Her speech is rapid, raised and pressured.  She is angry and her affect is congruent.  She is disorganized and tangential.  She refuses to answer many questions.  States, "I am using my right to remain silent".  She is alert, oriented x 4.  She is not cooperative.  She does not appear to be responding to internal/external.  Patient is delusional, states "I have my.  I am in labor and my feet hurt".  Patient denies suicidal/self-harm/homicidal ideation.  States she hears voices, but would not elaborate.    Collateral: Samantha Ashley, mother.  States that patients behavior has worsening. States patient has not slept in 2 days. States patient has become very aggressive with family especially her fiance.  States she was in car with her fiance and patient began pulling on steering wheel while they are driving. States patient is a "significant user of marijuana" and uses alcohol. States her behavior is manic and she is illogical. States the patient  has a history of schizoaffective disorder with  bipolar type. She has history of inpatient psychiatric admissions for similar presentation.   Mother states she does not know if patient has been taking her medications.  States that patients psychiatrist Dr.Izzy has changed patient's medications.  States patient currently takes trazodone 50 mg PO QHS, lamotrigine 100 mg QD PO, metformin 500 mg BID. States patient is not diabetic metformin is for other medication side effects. States Dr. Maggie Schwalbe stopped the Depakote and the Haldol. States her last dose of Haldol was Friday and that is when the insomnia began states she has continued to worsen since that time.  Psychiatric Specialty Exam  Presentation  General Appearance:Disheveled  Eye Contact:Fleeting  Speech:Pressured  Speech Volume:Increased  Handedness:Right   Mood and Affect  Mood:Anxious; Irritable  Affect:Congruent   Thought Process  Thought Processes:Disorganized  Descriptions of Associations:Tangential  Orientation:Full (Time, Place and Person)  Thought Content:Illogical  Diagnosis of Schizophrenia or Schizoaffective disorder in past: Yes  Duration of Psychotic Symptoms: Greater than six months  Hallucinations:Auditory hears peoples voices but would not elaborate  Ideas of Reference:Delusions  Suicidal Thoughts:No  Homicidal Thoughts:No   Sensorium  Memory:Immediate Poor; Recent Poor; Remote Poor  Judgment:Impaired  Insight:Lacking   Executive Functions  Concentration:Poor  Attention Span:Poor  Recall:Fair  Fund of Knowledge:Fair  Language:Fair   Psychomotor Activity  Psychomotor Activity:Increased   Assets  Assets:Housing; Health and safety inspector; Social Support   Sleep  Sleep:Poor  Number of hours: 0   No data recorded  Physical Exam: Physical  Exam Vitals and nursing note reviewed.  Constitutional:      General: She is not in acute distress.    Appearance: She is not ill-appearing.  Eyes:     Conjunctiva/sclera:  Conjunctivae normal.  Cardiovascular:     Rate and Rhythm: Normal rate.  Pulmonary:     Effort: Pulmonary effort is normal.  Musculoskeletal:        General: Normal range of motion.     Cervical back: Normal range of motion.  Skin:    General: Skin is dry.  Neurological:     Mental Status: She is alert and oriented to person, place, and time.  Psychiatric:        Attention and Perception: She is inattentive. She does not perceive auditory hallucinations.        Mood and Affect: Mood is anxious. Affect is angry.        Speech: Speech is rapid and pressured and tangential.        Behavior: Behavior is agitated and hyperactive.        Thought Content: Thought content is delusional.        Cognition and Memory: Cognition normal.        Judgment: Judgment is impulsive.   Review of Systems  Unable to perform ROS: Psychiatric disorder  Blood pressure 118/73, pulse 92, temperature 97.8 F (36.6 C), resp. rate 18, SpO2 100 %. There is no height or weight on file to calculate BMI.  Musculoskeletal: Strength & Muscle Tone: within normal limits Gait & Station: normal Patient leans: N/A   [facility] MSE Discharge Disposition for Follow up and Recommendations:  Patient meets inpatient psychiatric admission criteria.   Patient's acuity is too high to be managed here at the [facility].  Patient will be taken to [facility] emergency department via law enforcement. EMTALA completed.  Dr. Bronwen Betters completed first exam for IVC, it was given to [employer] to travel with IVC.   Ardis Hughs, NP 11/07/2020, 4:58 PM

## 2020-11-07 NOTE — BH Assessment (Signed)
Patient presented to the [facility] with family members.  Patient is in a manic episode and was cooperative when she was first registered.  However, prior to TTS seeing her, patient was refusing to stay and trying to rip her armband off.  Patient is manic, but states that she does not want to hurt herself or anyone else.  Patient was just seen at the [facility] yesterday for the same and she was refusing to stay yesterday as well.  Patient signed her waiver and left the facility AMA without being assessed or seeing a provider.  Patient's family was encouraged to petition patient for commitment.

## 2020-11-07 NOTE — ED Notes (Signed)
Patient allowed a phone call to her mother. Pt was asking mother for an apology for hurting her feelings. Pt asked to disconnect call. After hanging up on family member, pt began pacing and yelling. Police remain at bedside.

## 2020-11-07 NOTE — BH Assessment (Signed)
Disposition: Per Vernard Gambles, NP, Inpatient is recommended. @ 2229, Notified the [facility] AC Selena Batten ,Rn)  of patient's bed needs. Patient under review admission at [facility].

## 2020-11-07 NOTE — Consult Note (Signed)
Patient meets inpatient psychiatric admission criteria.  Discussed patient's medications with mother and her fianc.  Mother, Etheline Geppert [phone].They state patient sees Dr.Izzy on an outpatient basis.  States Dr. Maggie Schwalbe has changed patient's medications.  Patient's current medications are metformin 500 mg PO BID, trazodone 50 mg PO QHS, lamotrigine 100 mg PO QD. States Depakote and Haldol were discontinued.  Patient's current home medications, metformin, trazodone and lamotrigine were restarted  EKG was ordered Zyprexa 5 mg BID PO or IM ordered.  Dr. Stevie Kern and Ralph Leyden PA were notified.

## 2020-11-07 NOTE — Progress Notes (Signed)
Samantha Ashley to be D/C'd  to [facility]  per NP order. Patient escorted out and transported via [employer]. Dickie La  11/07/2020 4:43 PM

## 2020-11-07 NOTE — ED Notes (Signed)
Pt continues to scream, yelling that she is going to "hurt" herself, "I'm going to make a mess, "just be your fucking self", "God damnit, leave people alone". Provider made aware and new orders received.
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
## 2020-11-08 LAB — RAPID URINE DRUG SCREEN, HOSP PERFORMED
Amphetamines: NOT DETECTED
Barbiturates: NOT DETECTED
Benzodiazepines: POSITIVE — AB
Cocaine: NOT DETECTED
Opiates: NOT DETECTED
Tetrahydrocannabinol: POSITIVE — AB

## 2020-11-08 MED ORDER — LORAZEPAM 1 MG PO TABS
1.0000 mg | ORAL_TABLET | Freq: Once | ORAL | Status: AC
  Administered 2020-11-08: 1 mg via ORAL
  Filled 2020-11-08: qty 1

## 2020-11-08 MED ORDER — OLANZAPINE 5 MG PO TBDP
10.0000 mg | ORAL_TABLET | Freq: Once | ORAL | Status: AC
  Administered 2020-11-08: 10 mg via ORAL
  Filled 2020-11-08: qty 2

## 2020-11-08 MED ORDER — DIAZEPAM 5 MG PO TABS
5.0000 mg | ORAL_TABLET | Freq: Once | ORAL | Status: AC
  Administered 2020-11-08: 5 mg via ORAL
  Filled 2020-11-08: qty 1

## 2020-11-08 MED ORDER — ZOLPIDEM TARTRATE 5 MG PO TABS
5.0000 mg | ORAL_TABLET | Freq: Every evening | ORAL | Status: DC | PRN
  Administered 2020-11-08: 5 mg via ORAL
  Filled 2020-11-08: qty 1

## 2020-11-08 NOTE — ED Notes (Signed)
Patient showering at this time. 

## 2020-11-08 NOTE — ED Provider Notes (Signed)
Nurse requested for me to talk to patient as she is feeling anxious.  Pt voice frustration of being "imprison" in this hospital and sts she wants someone to listen to her.  I spend a moderate amount of time with active listening and giving feedback.  Pt felt better.  She is amenable for PO ativan as needed for agitation.  Reassured to patient that she will be evaluated by our psych team in the morning who will determine disposition.  At this time although pt is tearful, she is calm and amenable to plan.     [name], PA-C 11/08/20 5027    Zadie Rhine, MD 11/08/20 [phone]

## 2020-11-08 NOTE — ED Notes (Signed)
Patient awoke crying, asking for her wife, stating that we "just don't understand". Attempted to reassure patient.

## 2020-11-08 NOTE — ED Notes (Signed)
Patient's mother updated about status and plan of care.

## 2020-11-08 NOTE — ED Notes (Signed)
Dinner tray arrived 

## 2020-11-08 NOTE — ED Notes (Signed)
Patient has made several phone calls this morning. Patient informed that she cannot have any additional calls today. Patient acknowledged understanding the phone rules. Patient feeling "depressed" and requesting to be reevaluated. Patient stated, "I don't want to harm myself at home, but being here makes me want to harm myself." Patient expressed not understanding her diagnosis of schizoaffective d/o. Patient educated about diagnosis and treatment plan. Patient is calm and cooperative at this time and took ordered medications with no issue.

## 2020-11-08 NOTE — ED Provider Notes (Signed)
Emergency Medicine Observation Re-evaluation Note  Samantha Ashley is a 25 y.o. female, seen on rounds today.  Pt initially presented to the ED for complaints of Psychiatric Evaluation Currently, the patient is Under IVC and awaiting psych placement.  Physical Exam  BP 136/84 (BP Location: Right Arm)   Pulse 77   Temp 98.1 F (36.7 C) (Oral)   Resp 15   Ht 5\' 5"  (1.651 m)   Wt 114 kg   SpO2 100%   BMI 41.82 kg/m  Physical Exam General: NAD Lungs: No increased WOB Psych: resting comfortably  ED Course / MDM  EKG:EKG Interpretation  Date/Time:  Thursday November 07 2020 18:21:09 EDT Ventricular Rate:  91 PR Interval:  144 QRS Duration: 84 QT Interval:  386 QTC Calculation: 475 R Axis:   81 Text Interpretation: Sinus arrhythmia Otherwise within normal limits When compared with ECG of 08/22/2020, No significant change was found Confirmed by 08/24/2020 (Dione Booze) on 11/08/2020 5:18:44 AM  I have reviewed the labs performed to date as well as medications administered while in observation.  Recent changes in the last 24 hours include None.  Plan  Current plan is for psych placement. Patient is under full IVC at this time.   11/10/2020, DO 11/08/20 [phone]

## 2020-11-08 NOTE — ED Notes (Signed)
Patient woke up "feeling depressed" and attempted to make a phone call to her dad. Patient informed that she does not have any more phone calls for the day. Patient became tearful and upset. Informed patient of the phone rules. This RN proceeded to the patient's room for some therapeutic communication. Patient easily redirectable.

## 2020-11-08 NOTE — Progress Notes (Signed)
Patient has been denied by [facility] and has been recommended to be faxed out. Patient meets inpatient criteria per Inetta Fermo Allen,NP. Patient referred to the following facilities:  [facility]  [address], [place] [address] [phone]  [facility]  [address] [place] [address] [phone]  [facility]  [address] [place] [address] [phone]  [facility]  [address] [place] [address] [phone]  [facility]  [address], [place] [address] [phone]  [facility]  [address] [place], [place] [address] [phone]  [facility]  [address] [place], [place] [address] [phone]  [facility]  [address] [place] [address] [phone]  [facility]  [address], [place] [address] [phone]  [facility]  [address] [place] [address] [phone]  [facility]  [address], [place] [address] [phone]    CSW will continue to monitor disposition.    Damita Dunnings, MSW, LCSW-A  2:46 PM 11/08/2020

## 2020-11-08 NOTE — ED Notes (Signed)
Pt is out of the room again, pacing, crying saying "it's going to get ugly in here", "I feel triggered, I feel triggered", "I'm not crazy", "I don't want to die here, people die in hospitals", etc. Public Safety/Security at nurse's station trying to assist in soothing patient. Pt is requesting to see the Dr. Before she "hurts herself".

## 2020-11-08 NOTE — ED Notes (Signed)
Patient is crying and requesting to call her mom or her fiance. Patient informed that she has made the maximum amount of phone calls today. Patient additionally informed about her plan of care.

## 2020-11-08 NOTE — ED Notes (Signed)
Pt requested to speak with a Child psychotherapist. Upon asking what she needed, the patient expressed she wanted to talk to someone and proceeded to sit on the floor crying. Patient was instructed to sit in bed and this RN offered to sit and listen to the patient. The patient cooperated and expressed her concerns and feelings. Patient encouraged to shower. Patient expressed she doesn't want to shower because this is not her home, shower, and clothes. After some encouragement, the patient agreed to shower after using the restroom.

## 2020-11-08 NOTE — Progress Notes (Signed)
Patient information has been sent to [facility] via secure chat to review for potential admission. Patient meets inpatient criteria per Vernard Gambles, NP .   Situation ongoing, CSW will continue to monitor progress.    Signed:  Damita Dunnings, MSW, LCSW-A  11/08/2020 10:19 AM

## 2020-11-09 ENCOUNTER — Other Ambulatory Visit: Payer: Self-pay | Admitting: Psychiatry

## 2020-11-09 ENCOUNTER — Inpatient Hospital Stay (HOSPITAL_COMMUNITY)
Admission: RE | Admit: 2020-11-09 | Discharge: 2020-11-13 | DRG: 885 | Disposition: A | Discharge status: Home / Self Care | Payer: 59 | Source: Intra-hospital | Attending: Psychiatry | Admitting: Psychiatry

## 2020-11-09 DIAGNOSIS — F319 Bipolar disorder, unspecified: Secondary | ICD-10-CM | POA: Diagnosis present

## 2020-11-09 DIAGNOSIS — R21 Rash and other nonspecific skin eruption: Secondary | ICD-10-CM | POA: Diagnosis present

## 2020-11-09 DIAGNOSIS — F250 Schizoaffective disorder, bipolar type: Secondary | ICD-10-CM | POA: Diagnosis present

## 2020-11-09 DIAGNOSIS — R45851 Suicidal ideations: Secondary | ICD-10-CM | POA: Diagnosis not present

## 2020-11-09 DIAGNOSIS — F17290 Nicotine dependence, other tobacco product, uncomplicated: Secondary | ICD-10-CM | POA: Diagnosis present

## 2020-11-09 DIAGNOSIS — Z20822 Contact with and (suspected) exposure to covid-19: Secondary | ICD-10-CM | POA: Diagnosis present

## 2020-11-09 MED ORDER — METFORMIN HCL 500 MG PO TABS
500.0000 mg | ORAL_TABLET | Freq: Two times a day (BID) | ORAL | Status: DC
  Administered 2020-11-10 – 2020-11-13 (×7): 500 mg via ORAL
  Filled 2020-11-09 (×13): qty 1

## 2020-11-09 MED ORDER — ACETAMINOPHEN 325 MG PO TABS: 650.0000 mg | ORAL_TABLET | Freq: Four times a day (QID) | ORAL | Status: DC | PRN

## 2020-11-09 MED ORDER — OLANZAPINE 5 MG PO TBDP
ORAL_TABLET | ORAL | Status: AC
  Administered 2020-11-09: 5 mg
  Filled 2020-11-09: qty 1

## 2020-11-09 MED ORDER — OLANZAPINE 5 MG PO TBDP
5.0000 mg | ORAL_TABLET | Freq: Two times a day (BID) | ORAL | Status: DC
  Filled 2020-11-09: qty 1

## 2020-11-09 MED ORDER — ALUM & MAG HYDROXIDE-SIMETH 200-200-20 MG/5ML PO SUSP: 30.0000 mL | ORAL | Status: DC | PRN

## 2020-11-09 MED ORDER — MAGNESIUM HYDROXIDE 400 MG/5ML PO SUSP: 30.0000 mL | Freq: Every day | ORAL | Status: DC | PRN

## 2020-11-09 MED ORDER — LAMOTRIGINE 100 MG PO TABS
100.0000 mg | ORAL_TABLET | Freq: Every day | ORAL | Status: DC
  Administered 2020-11-10 – 2020-11-11 (×2): 100 mg via ORAL
  Filled 2020-11-09 (×5): qty 1

## 2020-11-09 MED ORDER — OLANZAPINE 5 MG PO TBDP
5.0000 mg | ORAL_TABLET | Freq: Two times a day (BID) | ORAL | Status: DC
  Administered 2020-11-09 – 2020-11-11 (×4): 5 mg via ORAL
  Filled 2020-11-09 (×9): qty 1

## 2020-11-09 MED ORDER — METFORMIN HCL 500 MG PO TABS
500.0000 mg | ORAL_TABLET | Freq: Every day | ORAL | Status: DC
  Filled 2020-11-09: qty 1

## 2020-11-09 MED ORDER — TRAZODONE HCL 50 MG PO TABS
50.0000 mg | ORAL_TABLET | Freq: Every evening | ORAL | Status: DC | PRN
  Administered 2020-11-09 – 2020-11-12 (×3): 50 mg via ORAL
  Filled 2020-11-09 (×3): qty 1

## 2020-11-09 MED ORDER — ACETAMINOPHEN 500 MG PO TABS
1000.0000 mg | ORAL_TABLET | Freq: Four times a day (QID) | ORAL | Status: DC | PRN
  Administered 2020-11-09 (×2): 1000 mg via ORAL
  Filled 2020-11-09 (×2): qty 2

## 2020-11-09 MED ORDER — ZOLPIDEM TARTRATE 5 MG PO TABS: 5.0000 mg | ORAL_TABLET | Freq: Once | ORAL | Status: DC

## 2020-11-09 MED ORDER — TRAZODONE HCL 50 MG PO TABS
ORAL_TABLET | ORAL | Status: AC
  Filled 2020-11-09: qty 1

## 2020-11-09 MED ORDER — LORAZEPAM 1 MG PO TABS
2.0000 mg | ORAL_TABLET | Freq: Once | ORAL | Status: AC
  Administered 2020-11-09: 2 mg via SUBLINGUAL
  Filled 2020-11-09: qty 2

## 2020-11-09 NOTE — ED Provider Notes (Signed)
Patient accepted to [facility]. Stable for transfer   Pollyann Savoy, MD 11/09/20 2213

## 2020-11-09 NOTE — ED Provider Notes (Addendum)
Emergency Medicine Observation Re-evaluation Note  Samantha Ashley is a 25 y.o. female, seen on rounds today.  Pt initially presented to the ED for complaints of trouble sleeping, 'manic episode'.  Physical Exam  BP 137/77 (BP Location: Left Arm)   Pulse 82   Temp 98.6 F (37 C) (Oral)   Resp 18   Ht 1.651 m (5\' 5" )   Wt 114 kg   SpO2 100%   BMI 41.82 kg/m  Physical Exam General: calm, alert Cardiac: regular rate Lungs: breathing comfortably Psych: normal mood and affect. Does not appear to be responding to internal stimuli. No SI/HI.  Pt intermittently seems alert, oriented, appropriate speech/content, but also expresses recurrent delusional and paranoid thoughts. Pt acknowledges a lot of stress, worry, trouble sleeping, restlessness.   ED Course / MDM    I have reviewed the labs performed to date as well as medications administered while in observation.  Recent changes in the last 24 hours include stabilization in ED, and BH reassessment and BH placement.   Plan  Current plan is for [facility] re-evaluation and placement, medication management.      [facility], MD 11/09/20 1232   Pt accepted to [facility] by Dr [facility]. Pt remains alert, content, no distress, normal vitals - pt appears stable for transfer to [facility].    [facility], MD 11/09/20 [phone]

## 2020-11-09 NOTE — Progress Notes (Signed)
Per Rosey Bath, [facility], pt has been accepted to [facility] bed 500-2. Accepting provider is Dr. Vernard Gambles, Attending provider is Dr. Jola Babinski. Patient can arrive after 8:00pm. Number for report is [phone].   Crissie Reese, MSW, LCSW-A Phone: [phone] Disposition/TOC

## 2020-11-09 NOTE — ED Notes (Signed)
Patient requesting to make phone call at this time. This RN reminded patient of phone rules--two 5-minute phone calls per day. Patient verbalized understanding. Patient made phone call to mother and then requested to use second phone call of day to contact spouse, Sumner Boast. Patient has now used both phone calls for the day and verbalizes understanding at this time. Patient is aware that she may not make another phone call until tomorrow.

## 2020-11-09 NOTE — ED Notes (Signed)
Patient given fresh scrubs, toiletries, and pads. Showering at this time.

## 2020-11-09 NOTE — ED Notes (Signed)
Patient requesting for her fiance, Sumner Boast, to be updated. Florinda Marker updated on patient status via telephone. She states that patient has not had an episode comparable to this current one in about 2 years, states patient has not been sleeping and it is exacerbating her mania. Fiance states that she believes inpatient psychiatric treatment would benefit patient at this time, reports that she and the patient's family have been trying to get patient to seek inpatient treatment on her own but were unsuccessful prior to IVC. Fiance additionally reports that patient "knows what to say to be discharged instead of treated" and wants staff to be aware of manipulative behaviors.

## 2020-11-09 NOTE — ED Notes (Signed)
Mom Archie Patten [phone] would like an update and to speak to her son

## 2020-11-09 NOTE — ED Notes (Signed)
Attempted to call report x2; delay in pt EMTALA transport to [facility] at this time. Charge RN has been made aware.

## 2020-11-09 NOTE — ED Notes (Signed)
Pt on the phone speaking to fiancee; req to also speak to mother--pt allowed to make quick 5 min phone call to mother.

## 2020-11-09 NOTE — Progress Notes (Signed)
Per Ardell Isaacs, patient meets criteria for inpatient treatment. There are no available or appropriate beds at [facility] today. CSW faxed referrals to the following facilities for review:  [facility]  [place] [facility]  [facility]  TTS will continue to seek bed placement.  Crissie Reese, MSW, LCSW-A, LCAS-A Phone: [phone] Disposition/TOC

## 2020-11-09 NOTE — ED Notes (Signed)
[facility] called to let this RN know that the unit has a situation going on at the moment that is preventing the pt from safely being accepted at this time. [facility] requesting that we wait for [facility] RN to call for report and to let us know when it is safe for pt to arrive. Will alert charge RN.

## 2020-11-09 NOTE — ED Notes (Signed)
Called [facility] to request transport for pt to [facility]. Will send someone as soon as they can. Pt is currently IVC.

## 2020-11-09 NOTE — ED Notes (Signed)
Attempted to call report to [facility] at this time; RN will call back due to shift change. Pt is aware that she will be transported to [facility] at or after 8pm and will be allowed to call fiancee to update on her transfer. Pt agreeable.

## 2020-11-09 NOTE — Progress Notes (Signed)
CSW contacted [facility] in reference to a follow-up on a referral sent for for review. It was reported that [facility] is currently at capacity.   Joshwa Hemric, MSW, LCSW-A, LCAS-A Phone: [phone] Disposition/TOC 

## 2020-11-09 NOTE — ED Notes (Signed)
Pt is c/o cramps and needs pain meds. Given PRN tylenol.

## 2020-11-10 ENCOUNTER — Other Ambulatory Visit: Payer: Self-pay

## 2020-11-10 ENCOUNTER — Encounter (HOSPITAL_COMMUNITY): Payer: Self-pay | Admitting: Psychiatry

## 2020-11-10 MED ORDER — NORETHINDRONE ACET-ETHINYL EST 1.5-30 MG-MCG PO TABS
1.0000 | ORAL_TABLET | Freq: Every day | ORAL | Status: DC
  Filled 2020-11-10 (×2): qty 1

## 2020-11-10 MED ORDER — WHITE PETROLATUM EX OINT
TOPICAL_OINTMENT | CUTANEOUS | Status: AC
  Filled 2020-11-10: qty 5

## 2020-11-10 MED ORDER — NORETHINDRONE ACET-ETHINYL EST 1.5-30 MG-MCG PO TABS
1.0000 | ORAL_TABLET | Freq: Every day | ORAL | Status: DC
  Administered 2020-11-10 – 2020-11-13 (×4): 1 via ORAL

## 2020-11-10 NOTE — Tx Team (Signed)
Initial Treatment Plan 11/10/2020 4:16 AM Samantha Ashley EQA:[id number]    PATIENT STRESSORS: Substance abuse Other: Anniversary of sister in law coming up in September   PATIENT STRENGTHS: Ability for insight Capable of independent living Communication skills Supportive family/friends Work skills   PATIENT IDENTIFIED PROBLEMS: Substance abuse  Mania        " I want to be assertive without being disrespectful."           DISCHARGE CRITERIA:  Improved stabilization in mood, thinking, and/or behavior  PRELIMINARY DISCHARGE PLAN: Return to previous living arrangement  PATIENT/FAMILY INVOLVEMENT: This treatment plan has been presented to and reviewed with the patient, [name], and/or family member,.  The patient and family have been given the opportunity to ask questions and make suggestions.  Floyce Stakes, RN 11/10/2020, 4:16 AM

## 2020-11-10 NOTE — BHH Group Notes (Signed)
10, honest without being rude, working on it

## 2020-11-10 NOTE — Plan of Care (Addendum)
Nurse discussed coping skills with patient.  

## 2020-11-10 NOTE — BHH Suicide Risk Assessment (Signed)
[facility] Admission Suicide Risk Assessment   Nursing information obtained from:  Patient Demographic factors:  Adolescent or young adult, Samantha Ashley, lesbian, or bisexual orientation Current Mental Status:  NA Loss Factors:  NA Historical Factors:  Impulsivity Risk Reduction Factors:  Positive social support, Positive coping skills or problem solving skills  Total Time spent with patient: 30 minutes Principal Problem: <principal problem not specified> Diagnosis:  Active Problems:   Schizoaffective disorder, bipolar type (HCC)   Bipolar 1 disorder (HCC)  Subjective Data: Patient is seen and examined.  Patient is a 25 year old female with a reported past psychiatric history significant for schizoaffective disorder; bipolar type as well as cannabis use disorder who originally presented to the [facility] on 11/06/2020.  She had been transported there by police.  She reported that she had called the police after "a conversation I had with my mom".  She stated that her family does not understand her illness, and that the patient understands her illness more than anyone else does.  She stated she becomes quite upset when people do not understand her.  She lives with her fianc, but the fianc works odd hours at night.  She reported that she had trouble sleeping at night and had to use coping skills.  She stated at that time that she had been diagnosed previously with bipolar disorder, and had been "manic" since April 24.  She reported significant sleeping difficulty.  She is followed by an outpatient psychiatrist who recently changed her medications, but "forgot to take all my medicines last night".  She also is currently seeing a therapist but fired them because she failed to complete her short-term disability documents, and the patient felt as though she was being treated for anxiety and depression in therapy and that she needed to be treated for "mania".  The evaluating person contacted  the patient's fianc, and her fianc denied any concerns for safety.  She was discharged from the behavioral health urgent care center.  She then again presented to the [facility] on 11/07/2020.  She had been involuntarily committed.  At that time she was quite agitated and was hitting the wall in the door.  She was quite delusional and stated that "I am in labor and my feet hurt".  She would not elaborate.  The patient had continued Depakote and Haldol from her most recent psychiatric hospitalization at our facility, but the patient stated that her outpatient psychiatrist told her to stop that, and "he did not put me on anything else".  She was admitted to the hospital for evaluation and stabilization.  Her current medications apparently were trazodone 50 mg p.o. nightly, lamotrigine 100 mg p.o. daily and metformin 500 mg p.o. twice daily. On discussion today the patient stated that the Haldol was stopped because it was "physically restraining me".  She complained of left-sided weakness and an inability to use her hands at work.  I was the one who admitted the patient to the hospital back on 08/20/2020.  Her episode of agitation that occurred at the behavioral health center was similar at that time.  In July 2020 she was hospitalized, she was on combination therapy with Risperdal long-acting as well as haloperidol.  She was also being treated with Depakote at that time.  On the behavioral health visit from 02/23/2019 her medications included the Risperdal microspheres, Depakote and Haldol.  On her hospitalization in July 2020 it was noted that the patient received Risperdal Consta 50 mg,  and her next dosage was due on 11/10/2018.  In June 2020 she was hospitalized at that time as well, and was on Depakote, Risperdal 3 mg p.o. every morning and 6 mg p.o. nightly and was to receive the Risperdal long-acting injection as well.  I actually admitted the patient on 10/16/2018.  It does appear  that each time she is admitted she has significant agitation and requires intermittent muscular medication to calm her.  He does appear these are the only medications that she has been treated with in the past.  She was admitted to the hospital for evaluation and stabilization. Continued Clinical Symptoms:    The "Alcohol Use Disorders Identification Test", Guidelines for Use in Primary Care, Second Edition.  World Science writer [facility] Score between 0-7:  no or low risk or alcohol related problems. Score between 8-15:  moderate risk of alcohol related problems. Score between 16-19:  high risk of alcohol related problems. Score 20 or above:  warrants further diagnostic evaluation for alcohol dependence and treatment.   CLINICAL FACTORS:   Bipolar Disorder:   Mixed State Schizophrenia:   Less than 20 years old   Musculoskeletal: Strength & Muscle Tone: within normal limits Gait & Station: normal Patient leans: N/A  Psychiatric Specialty Exam:  Presentation  General Appearance: Casual  Eye Contact:Good  Speech:Clear and Coherent; Pressured  Speech Volume:Normal  Handedness:Right   Mood and Affect  Mood:Anxious  Affect:Congruent   Thought Process  Thought Processes:Coherent; Goal Directed  Descriptions of Associations:Intact  Orientation:Full (Time, Place and Person)  Thought Content:Logical  History of Schizophrenia/Schizoaffective disorder:No  Duration of Psychotic Symptoms:Greater than six months  Hallucinations:Hallucinations: None  Ideas of Reference:None  Suicidal Thoughts:Suicidal Thoughts: Yes, Passive SI Passive Intent and/or Plan: Without Intent; Without Plan  Homicidal Thoughts:Homicidal Thoughts: No   Sensorium  Memory:Immediate Good; Recent Good; Recent Poor  Judgment:Fair  Insight:Fair   Executive Functions  Concentration:Fair  Attention Span:Good  Recall:Good  Fund of Knowledge:Good  Language:Good   Psychomotor  Activity  Psychomotor Activity:Psychomotor Activity: Normal   Assets  Assets:Communication Skills; Desire for Improvement; Financial Resources/Insurance; Housing; Intimacy; Physical Health; Social Support; English as a second language teacher; Vocational/Educational   Sleep  Sleep:Sleep: Poor Number of Hours of Sleep: 2    Physical Exam: Physical Exam Vitals and nursing note reviewed.  HENT:     Head: Normocephalic and atraumatic.  Pulmonary:     Effort: Pulmonary effort is normal.  Neurological:     General: No focal deficit present.     Mental Status: She is oriented to person, place, and time.   Review of Systems  All other systems reviewed and are negative. Blood pressure (!) 145/108, pulse 92, temperature 98 F (36.7 C), temperature source Oral, resp. rate 18, height 5\' 5"  (1.651 m), weight 114 kg, last menstrual period 11/10/2020, SpO2 100 %. Body mass index is 41.82 kg/m.   COGNITIVE FEATURES THAT CONTRIBUTE TO RISK:  Thought constriction (tunnel vision)    SUICIDE RISK:   Moderate:  Frequent suicidal ideation with limited intensity, and duration, some specificity in terms of plans, no associated intent, good self-control, limited dysphoria/symptomatology, some risk factors present, and identifiable protective factors, including available and accessible social support.  PLAN OF CARE: Patient is seen and examined.  Patient is a 25 year old female with the above-stated past psychiatric history who is admitted with worsening manic symptoms, agitation, some suggested suicidal ideation.  She will be admitted to the hospital.  She will be integrated in the milieu.  She will be encouraged to  attend groups.  Her admission medications from the behavioral health urgent care center included olanzapine 5 mg p.o. twice daily, continuation of her Lamictal at 100 mg p.o. daily, and trazodone 50 mg p.o. nightly as well as Ambien 5 mg.  She does take Glucophage 500 mg p.o. twice daily for weight gain  associated with her medications, but we will also check her blood sugars during the course of the hospitalization.  There also appears to be some significant personality disorder overlay to her illness.  Review of her admission laboratories revealed a mildly low sodium at 132.  The rest of her electrolytes including her creatinine and liver function enzymes were normal.  CBC was essentially normal.  Differential was normal.  Acetaminophen was less than 10, salicylate less than 7.  Beta-hCG was negative.  Respiratory panel for influenza A, B and coronavirus were negative.  Blood alcohol was less than 10.  Drug screen was positive for benzodiazepines as well as marijuana.  EKG showed a normal sinus rhythm with a QTc interval of 475.  Interpretation from previous was that it was unchanged.  This morning her blood pressure is elevated at 145/108, pulse is 92.  We will monitor to see if blood pressure medication is necessary during the course hospitalization.  I certify that inpatient services furnished can reasonably be expected to improve the patient's condition.   Antonieta Pert, MD 11/10/2020, 10:30 AM

## 2020-11-10 NOTE — BHH Group Notes (Signed)
Patient participated in group and was attentive. 

## 2020-11-10 NOTE — BHH Counselor (Signed)
Clinical Social Work Note  Psychosocial Assessment not possible today due to patient's agitation.  Ambrose Mantle, LCSW 11/10/2020, 12:03 PM

## 2020-11-10 NOTE — Progress Notes (Signed)
   11/10/20 2252  Psych Admission Type (Psych Patients Only)  Admission Status Involuntary  Psychosocial Assessment  Patient Complaints Insomnia  Eye Contact Fair  Facial Expression Flat  Affect Appropriate to circumstance  Speech Logical/coherent  Interaction Assertive  Motor Activity Other (Comment) (WDL)  Appearance/Hygiene Unremarkable  Behavior Characteristics Appropriate to situation  Mood Pleasant  Thought Process  Coherency WDL  Content WDL  Delusions None reported or observed  Perception WDL  Hallucination None reported or observed  Judgment Poor  Confusion None  Danger to Self  Current suicidal ideation? Denies  Danger to Others  Danger to Others None reported or observed

## 2020-11-10 NOTE — Progress Notes (Signed)
Patient is a 25 year old female IVC. She reports she has been having difficulty sleeping for 3 days and became manic. She reports that her mother doesn't understand her mental illness and she feels she has family members with mental illness but just have not been diagnosed. She reports THC use daily and alcohol rarely reporting her last drink on July 4th. She denies si/hi/auditory or visual hallucinations. She reports her fiance and mother are supportive. Medical hx - Asthma and anxiety reported. Skin assessment completed, patient was pleasant and cooperative. Oriented to unit, patient safe with 15 min checks started.

## 2020-11-10 NOTE — BHH Group Notes (Signed)
BHH LCSW Group Therapy Note  11/10/2020    Type of Therapy and Topic:  Group Therapy:  Adding Supports Including Yourself  Participation Level:  Active   Description of Group:   Patients in this group were introduced to the concept that additional supports including self-support are an essential part of recovery.  Patients listed their current healthy and unhealthy supports, and discussed the difference between the two.   Several songs were played and a group discussion ensued in which patients stated they could relate to the songs which inspired them to realize they have be willing to help themselves in order to succeed, because other people cannot achieve sobriety or stability for them.  Parents were encouraged toward self-advocacy and self-support as part of their recovery.  They discussed their reactions to these songs' messages, which were positive and hopeful.  Before group ended, they identified the supports they believe they need to add to their lives to achieve their goals at discharge.   Therapeutic Goals: 1)  explain the difference between healthy and unhealthy supports and discuss what specific supports are currently in patients' lives 2)  demonstrate the importance of being a key part of one's own support system 3)  discuss the need for appropriate boundaries with supports 4)  elicit ideas from patients about supports that need to be added in order to achieve goals   Summary of Patient Progress:   The patient listed current healthy supports as friends and current unhealthy supports as her family that belittles her.  The patient became argumentative almost immediately as CSW attempted to describe boundaries.  She told the group that self-control is the only thing needed to not use substances, using as her evidence the fact that she smokes marijuana "a gateway drug" and has never used cocaine, heroin or other things.  When CSW tried to share that an addict's problem is deeper than using  willpower, her argumentativeness grew stronger.  CSW was asked a question by another group member, and patient kept interrupting with arguments against CSW's response.  CSW objected to the continual interruption and patient turned to the group to ask if "this is a discussion, is it not?"  She was very angry, raising her voice and standing while pointing her finger angrily at CSW to make her points.  CSW tried to stop the group by leaving, was asked to see it through, so CSW relented. Patient accused CSW of not listening to her actively, but rather thinking about CSW's own responses, beliefs, and money being made.  She stated CSW hurt her feelings and she was not willing to let that happen.  Her aggressive responses were inappropriate and another group member succeeded partially in calming her down, but as soon as she readdressed CSW, even though CSW was completely silent, she escalated once again.  Eventually she was called out by a provider.  Therapeutic Modalities:   Motivational Interviewing Activity  Lynnell Chad

## 2020-11-10 NOTE — Progress Notes (Addendum)
[facility] MD Progress Note  11/10/2020 2:37 PM Samantha Ashley  MRN:  [id number] Subjective:   Samantha Ashley is a 33 YOF with a PPHx of schizoaffective d/o, bipolar type, as well as cannabis use disorder, presenting under IVC with concern for mania and a potential suicide attempt.  On interview this morning, patient has a logical and coherent thought process but is emotionally labile with moderately pressured speech. Patient reports sleeping well for the first time in several days (staff reports 4 hours) she attributes this to the medicine and asks to be kept on whatever she is taking. She is hyperreligious and at multiple points mentions Jesus or God. She is perseverative on a group session she just finished with the LCSW, where she feels her problems were inadequately dealt with. Patient displays some insight into the fact that she has a mental illness and mania, however, she believes strongly in continuing use of cannabis and feels her family is out to get her.   Principal Problem: Schizoaffective disorder, bipolar type (HCC) Diagnosis: Principal Problem:   Schizoaffective disorder, bipolar type (HCC) Active Problems:   Bipolar 1 disorder (HCC)  Total Time spent in direct patient care: 45 minutes  Past Psychiatric History: as above  Past Medical History:  Past Medical History:  Diagnosis Date   Asthma    History reviewed. No pertinent surgical history. Family History:  Family History  Problem Relation Age of Onset   Healthy Mother    Healthy Father    Family Psychiatric  History: denies any formal diagnoses, but suspects bipolar d/o Social History:  Social History   Substance and Sexual Activity  Alcohol Use Not Currently     Social History   Substance and Sexual Activity  Drug Use Yes   Types: Marijuana    Social History   Socioeconomic History   Marital status: Single    Spouse name: Not on file   Number of children: Not on file   Years of education: Not on file    Highest education level: Not on file  Occupational History   Not on file  Tobacco Use   Smoking status: Every Day    Types: Cigars   Smokeless tobacco: Never  Substance and Sexual Activity   Alcohol use: Not Currently   Drug use: Yes    Types: Marijuana   Sexual activity: Yes    Birth control/protection: Pill  Other Topics Concern   Not on file  Social History Narrative   Not on file   Social Determinants of Health   Financial Resource Strain: Not on file  Food Insecurity: Not on file  Transportation Needs: Not on file  Physical Activity: Not on file  Stress: Not on file  Social Connections: Not on file   Additional Social History:     Sleep: Poor  Appetite:  Fair  Current Medications: Current Facility-Administered Medications  Medication Dose Route Frequency Provider Last Rate Last Admin   acetaminophen (TYLENOL) tablet 650 mg  650 mg Oral Q6H PRN Ajibola, Ene A, NP       alum & mag hydroxide-simeth (MAALOX/MYLANTA) 200-200-20 MG/5ML suspension 30 mL  30 mL Oral Q4H PRN Ajibola, Ene A, NP       lamoTRIgine (LAMICTAL) tablet 100 mg  100 mg Oral Daily Leevy-Johnson, Brooke A, NP   100 mg at 11/10/20 0740   magnesium hydroxide (MILK OF MAGNESIA) suspension 30 mL  30 mL Oral Daily PRN Ajibola, Ene A, NP       metFORMIN (  GLUCOPHAGE) tablet 500 mg  500 mg Oral BID Ajibola, Ene A, NP   500 mg at 11/10/20 0740   Norethindrone Acetate-Ethinyl Estradiol (LOESTRIN) 1.5-30 MG-MCG tablet 1 tablet  1 tablet Oral Daily Antonieta Pert, MD       OLANZapine zydis (ZYPREXA) disintegrating tablet 5 mg  5 mg Oral BID Ajibola, Ene A, NP   5 mg at 11/10/20 0809   traZODone (DESYREL) tablet 50 mg  50 mg Oral QHS PRN Ajibola, Ene A, NP   50 mg at 11/09/20 2355   white petrolatum (VASELINE) gel            zolpidem (AMBIEN) tablet 5 mg  5 mg Oral Once Ajibola, Ene A, NP        Lab Results: No results found for this or any previous visit (from the past 48 hour(s)).  Blood Alcohol level:   Lab Results  Component Value Date   ETH <10 11/07/2020   ETH <10 10/25/2018    Metabolic Disorder Labs: Lab Results  Component Value Date   HGBA1C 5.1 08/20/2020   MPG 99.67 08/20/2020   MPG 93.93 10/11/2018   Lab Results  Component Value Date   PROLACTIN 98.3 (H) 10/29/2018   PROLACTIN 52.5 (H) 10/11/2018   Lab Results  Component Value Date   CHOL 86 08/20/2020   TRIG 93 08/20/2020   HDL 23 (L) 08/20/2020   CHOLHDL 3.7 08/20/2020   VLDL 19 08/20/2020   LDLCALC 44 08/20/2020   LDLCALC 54 10/29/2018    Physical Findings: AIMS:  , ,  ,  ,    CIWA:    COWS:     Musculoskeletal: Strength & Muscle Tone: within normal limits Gait & Station: normal Patient leans: N/A  Psychiatric Specialty Exam:  Presentation  General Appearance: Appropriate for Environment  Eye Contact:Good  Speech:Pressured  Speech Volume:Increased  Handedness:Right   Mood and Affect  Mood:Anxious; Angry  Affect:Congruent   Thought Process  Thought Processes:Goal Directed; Disorganized  Descriptions of Associations:Intact  Orientation:Full (Time, Place and Person)  Thought Content:Logical; Perseveration  History of Schizophrenia/Schizoaffective disorder:Yes  Duration of Psychotic Symptoms:Greater than six months  Hallucinations:Hallucinations: None Description of Auditory Hallucinations: None  Ideas of Reference:None  Suicidal Thoughts:Suicidal Thoughts: No SI Passive Intent and/or Plan: Without Intent; Without Plan  Homicidal Thoughts:Homicidal Thoughts: No   Sensorium  Memory:Immediate Good; Recent Good; Remote Good  Judgment:Poor  Insight:Poor   Executive Functions  Concentration:Fair  Attention Span:Fair  Recall:Fair  Fund of Knowledge:Fair  Language:Fair   Psychomotor Activity  Psychomotor Activity:Psychomotor Activity: Normal   Assets  Assets:Physical Health; Housing; Social Support; Communication Skills; Desire for Improvement   Sleep   Sleep:Sleep: Poor Number of Hours of Sleep: 4    Physical Exam: Physical Exam Vitals reviewed.  Constitutional:      Appearance: Normal appearance.  HENT:     Head: Normocephalic and atraumatic.  Eyes:     Extraocular Movements: Extraocular movements intact.  Cardiovascular:     Rate and Rhythm: Normal rate and regular rhythm.  Pulmonary:     Effort: Pulmonary effort is normal.  Musculoskeletal:        General: Normal range of motion.  Neurological:     Mental Status: She is alert.   Review of Systems  Respiratory:  Negative for shortness of breath.   Cardiovascular:  Negative for chest pain.  Neurological:  Negative for headaches.  Blood pressure (!) 145/108, pulse 92, temperature 98 F (36.7 C), temperature source Oral, resp. rate  18, height 5\' 5"  (1.651 m), weight 114 kg, last menstrual period 11/10/2020, SpO2 100 %. Body mass index is 41.82 kg/m.   Assessment Glenola Wheat is a 41 YOF with a PPHx of schizoaffective d/o, bipolar type, as well as cannabis use disorder, presenting under IVC with concern for mania and a potential suicide attempt. Patient exhibits signs of mania (pressured speech, emotional lability, irritability, and hyper-religiosity), however, she also exhibits cluster B personality traits. Patient appears improved from previous day with Zyprexa, will continue.   Treatment Plan Summary: Daily contact with patient to assess and evaluate symptoms and progress in treatment and Medication management   Safety and Monitoring -- INVOLUNTARY admission to inpatient psychiatric unit for safety, stabilization and treatment -- Daily contact with patient to assess and evaluate symptoms and progress in treatment -- Patient's case to be discussed in multi-disciplinary team meeting -- Observation Level : q15 minute checks -- Vital signs:  q12 hours -- Precautions: suicide  Symptoms consistent with mania, Hx schizoaffective d/o bipolar type Patient restarted on  home Lamictal 100 mg daily As well as Zyprexa 5 mg bid (7/17) as a new medication Antipsychotic labs  EKG: NSR, Qtc 475 (7/14), recheck ordered  Lipids: pending  A1C: pending Weight gain may limit long term compliance  Cannabis Use Disorder -Encourage abstinence -Motivational interviewing  Medical Management Covid neg CBC w WBC of 10.6, no other signs of infection CMP w Na of 132, no clinical signs of hyponatremia, repeat ordered EtOH <10 UDS: benzos (pt has an Rx for Klonopin from 08/2020) and THC Upreg neg Pt on MF 500 mg bid as a home med (last A1C was 5.1, 07/2020) Pt on Loestrin OCP, continue  Continue PRNs: Tylenol, Maalox, Milk of Magnesia, Trazodone  08/2020 PGY-1, Psychiatry

## 2020-11-10 NOTE — Plan of Care (Signed)
Nurse discussed anxiety, depression and coping skills with patient.  

## 2020-11-10 NOTE — Progress Notes (Signed)
D:  Patient's self inventory sheet, patient sleeps good, sleep medication helpful.  Good appetite, high energy level, good concentration.  Rated depression, hopeless and anxiety 5.  Denied withdrawals.  Denied SI.  Denied physical problems.  Denied physical pain.  Goal is keep emotions in checks.  Plans to deep breath.  "The longer I stay, the worse I feel."  Does have discharge plans.  "Staying in this facility for an extended amount of time." A:  Medications administered per MD orders.  Emotional support and  encouragement. R:  Denied SI and HI, contracts for safety.  Denied A/V hallucinations.  Safety maintained with 15 minute checks.

## 2020-11-10 NOTE — H&P (Signed)
Psychiatric Admission Assessment Adult  Patient Identification: Samantha Ashley MRN:  [id number] Date of Evaluation:  11/10/2020 Chief Complaint:  Bipolar 1 disorder (HCC) [F31.9] Schizoaffective disorder, bipolar type (HCC) [F25.0] Principal Diagnosis: <principal problem not specified> Diagnosis:  Active Problems:   Schizoaffective disorder, bipolar type (HCC)   Bipolar 1 disorder (HCC)  History of Present Illness: Samantha Ashley is a 25 y/o female. Patient presented under IVC paperwork from [facility].   Patient seen face to face by this NP and chart reviewed on 11/10/2020. Samantha Ashley has psychiatric history of schizoaffective disorder, bipolar type with prior psychiatric inpatient admission for mania; she was last admitted to this facility in August 19, 2020. On evaluation Samantha Ashley is alert and oriented X4; she is pleasant and cooperative. She describes her mood as anxious and her affect is congruent with stated mood. Her speech is clear and coherent but rapid and pressured. Her thought process is coherent. There were no indications that she is responding to any internal or external stimuli and no evidence of delusional thought content noted during assessment.   She endorses passive suicidal thoughts of driving of the road, she later states this is "intrusive thoughts and I will never act on it." She admits to prior history of self-harming at age 18 and suicidal attempt X 1 by "overdosing on dad's medication at age 82." She states she was not hospitalized and her mother is unaware of suicide attempt. She denies homicidal ideation, auditory/visual hallucination, and paranoia. He admits to using less than 1 gram of marijuana daily, she reports that she drinks alcohol socially, 1-3 shots on Saturdays. She denies other substance use.     Patient acknowledged that she was in a "manic state" and was sleeping about 1-3 hours/night in the 3 days prior to hospitalization. She states "I can either be manic or  be depress; I have Bipolar. I choose mania over depression any day." She reports that she became frustrated because her family members were treating her like a "psychiatric patient" and will not allow her to leave the house. She reports that her fiance and mom hid her car key and will not allow her to drive. She then report that she went to the third floor in her apartment building, climbed a ladder to the roof to look at the air condition and her family became "scared" and contacted the police. She denied that this was a suicidal attempt and reports "I'm scared of heights, I don't even know why I went up there. I only went up there because I saw the ladder, if the ladder wasn't there I wouldn't have climbed up there."  Patient denied all allegations made in her IVC petition. She reports that her fiance was responsible for damaged properties in their home. She says that her fiance became upset and was punching the walls and throwing items at the walls because "I locked her in the house and sat in front of the door to keep her from running away from our issues like she always does." She reports that she is complaint with her medication and that she followed by Dr. Maggie Schwalbe for medication management.     Associated Signs/Symptoms:  Depression Symptoms:     Duration of Depression Symptoms: Greater than two weeks  (Hypo) Manic Symptoms:  Distractibility, Elevated Mood, Impulsivity, Anxiety Symptoms:   Psychotic Symptoms:   PTSD Symptoms:  Total Time spent with patient: 20 minutes  Past Psychiatric History: Schizoaffective disorder, bipolar type   Is the patient at  risk to self? Yes.    Has the patient been a risk to self in the past 6 months? No.  Has the patient been a risk to self within the distant past? Yes.    Is the patient a risk to others? No.  Has the patient been a risk to others in the past 6 months? No.  Has the patient been a risk to others within the distant past? No.   Prior  Inpatient Therapy:   Prior Outpatient Therapy:    Alcohol Screening:   Substance Abuse History in the last 12 months:  Yes.   Consequences of Substance Abuse: NA Previous Psychotropic Medications: Yes  Psychological Evaluations: No  Past Medical History:  Past Medical History:  Diagnosis Date   Asthma    No past surgical history on file. Family History:  Family History  Problem Relation Age of Onset   Healthy Mother    Healthy Father    Family Psychiatric  History:  Tobacco Screening:   Social History:  Social History   Substance and Sexual Activity  Alcohol Use No     Social History   Substance and Sexual Activity  Drug Use Yes   Types: Marijuana    Additional Social History:                           Allergies:   Allergies  Allergen Reactions   Coconut Oil Itching   Lab Results:  Results for orders placed or performed during the hospital encounter of 11/07/20 (from the past 48 hour(s))  Rapid urine drug screen (hospital performed)     Status: Abnormal   Collection Time: 11/08/20  2:56 AM  Result Value Ref Range   Opiates NONE DETECTED NONE DETECTED   Cocaine NONE DETECTED NONE DETECTED   Benzodiazepines POSITIVE (A) NONE DETECTED   Amphetamines NONE DETECTED NONE DETECTED   Tetrahydrocannabinol POSITIVE (A) NONE DETECTED   Barbiturates NONE DETECTED NONE DETECTED    Comment: (NOTE) DRUG SCREEN FOR MEDICAL PURPOSES ONLY.  IF CONFIRMATION IS NEEDED FOR ANY PURPOSE, NOTIFY LAB WITHIN 5 DAYS.  LOWEST DETECTABLE LIMITS FOR URINE DRUG SCREEN Drug Class                     Cutoff (ng/mL) Amphetamine and metabolites    1000 Barbiturate and metabolites    200 Benzodiazepine                 200 Tricyclics and metabolites     300 Opiates and metabolites        300 Cocaine and metabolites        300 THC                            50 Performed at [facility], [address], [place], [place] [address]     Blood Alcohol level:  Lab  Results  Component Value Date   [facility] <10 11/07/2020   ETH <10 10/25/2018    Metabolic Disorder Labs:  Lab Results  Component Value Date   HGBA1C 5.1 08/20/2020   MPG 99.67 08/20/2020   MPG 93.93 10/11/2018   Lab Results  Component Value Date   PROLACTIN 98.3 (H) 10/29/2018   PROLACTIN 52.5 (H) 10/11/2018   Lab Results  Component Value Date   CHOL 86 08/20/2020   TRIG 93 08/20/2020   HDL 23 (L) 08/20/2020  CHOLHDL 3.7 08/20/2020   VLDL 19 08/20/2020   LDLCALC 44 08/20/2020   LDLCALC 54 10/29/2018    Current Medications: Current Facility-Administered Medications  Medication Dose Route Frequency Provider Last Rate Last Admin   acetaminophen (TYLENOL) tablet 650 mg  650 mg Oral Q6H PRN Daisuke Bailey A, NP       alum & mag hydroxide-simeth (MAALOX/MYLANTA) 200-200-20 MG/5ML suspension 30 mL  30 mL Oral Q4H PRN Emree Locicero A, NP       lamoTRIgine (LAMICTAL) tablet 100 mg  100 mg Oral Daily Leevy-Johnson, Brooke A, NP       magnesium hydroxide (MILK OF MAGNESIA) suspension 30 mL  30 mL Oral Daily PRN Shivan Hodes A, NP       metFORMIN (GLUCOPHAGE) tablet 500 mg  500 mg Oral BID Tian Mcmurtrey A, NP       OLANZapine zydis (ZYPREXA) disintegrating tablet 5 mg  5 mg Oral BID Berdie Malter A, NP   5 mg at 11/09/20 2356   traZODone (DESYREL) 50 MG tablet            traZODone (DESYREL) tablet 50 mg  50 mg Oral QHS PRN Alika Saladin A, NP   50 mg at 11/09/20 2355   zolpidem (AMBIEN) tablet 5 mg  5 mg Oral Once Donjuan Robison A, NP       PTA Medications: Medications Prior to Admission  Medication Sig Dispense Refill Last Dose   acetaminophen (TYLENOL) 500 MG tablet Take 500-1,000 mg by mouth every 6 (six) hours as needed for mild pain or headache.      albuterol (VENTOLIN HFA) 108 (90 Base) MCG/ACT inhaler Inhale 2 puffs into the lungs every 6 (six) hours as needed for wheezing or shortness of breath.      aspirin 325 MG EC tablet Take 325 mg by mouth daily as needed (for headaches).       aspirin-acetaminophen-caffeine (EXCEDRIN MIGRAINE) 250-250-65 MG tablet Take 1 tablet by mouth every 6 (six) hours as needed for headache.      benztropine (COGENTIN) 1 MG tablet Take 1 tablet (1 mg total) by mouth 2 (two) times daily as needed for tremors (EPS). (Patient not taking: Reported on 11/08/2020) 30 tablet 0    diphenhydrAMINE (BENADRYL) 25 mg capsule Take 25 mg by mouth every 6 (six) hours as needed for allergies.      divalproex (DEPAKOTE ER) 500 MG 24 hr tablet Take 2 tablets (1,000 mg total) by mouth at bedtime. (Patient not taking: No sig reported) 60 tablet 0    fluticasone (FLONASE) 50 MCG/ACT nasal spray Place 1 spray into both nostrils daily. (Patient taking differently: Place 1-2 sprays into both nostrils daily as needed for allergies or rhinitis.) 9.9 mL 0    haloperidol (HALDOL) 10 MG tablet Take 1 tablet (10 mg total) by mouth at bedtime. (Patient not taking: No sig reported) 30 tablet 0    haloperidol (HALDOL) 5 MG tablet Take 1 tablet (5 mg total) by mouth 2 (two) times daily. (Patient not taking: No sig reported) 60 tablet 0    JUNEL FE 1.5/30 1.5-30 MG-MCG tablet Take 1 tablet by mouth at bedtime.      lamoTRIgine (LAMICTAL) 100 MG tablet Take 100 mg by mouth at bedtime.      metFORMIN (GLUCOPHAGE) 500 MG tablet Take 500 mg by mouth 2 (two) times daily.      traZODone (DESYREL) 100 MG tablet Take 2 tablets (200 mg total) by mouth at bedtime. (Patient not  taking: Reported on 11/08/2020) 60 tablet 0    traZODone (DESYREL) 50 MG tablet Take 50 mg by mouth at bedtime.       Musculoskeletal: Strength & Muscle Tone: within normal limits Gait & Station: normal Patient leans: Right            Psychiatric Specialty Exam:  Presentation  General Appearance: Casual  Eye Contact:Good  Speech:Clear and Coherent; Pressured  Speech Volume:Normal  Handedness:Right   Mood and Affect  Mood:Anxious  Affect:Congruent   Thought Process  Thought  Processes:Coherent; Goal Directed  Duration of Psychotic Symptoms: Greater than six months  Past Diagnosis of Schizophrenia or Psychoactive disorder: No  Descriptions of Associations:Intact  Orientation:Full (Time, Place and Person)  Thought Content:Logical  Hallucinations:Hallucinations: None  Ideas of Reference:None  Suicidal Thoughts:Suicidal Thoughts: Yes, Passive SI Passive Intent and/or Plan: Without Intent; Without Plan  Homicidal Thoughts:Homicidal Thoughts: No   Sensorium  Memory:Immediate Good; Recent Good; Recent Poor  Judgment:Fair  Insight:Fair   Executive Functions  Concentration:Fair  Attention Span:Good  Recall:Good  Fund of Knowledge:Good  Language:Good   Psychomotor Activity  Psychomotor Activity:Psychomotor Activity: Normal   Assets  Assets:Communication Skills; Desire for Improvement; Financial Resources/Insurance; Housing; Intimacy; Physical Health; Social Support; English as a second language teacher; Vocational/Educational   Sleep  Sleep:Sleep: Poor Number of Hours of Sleep: 2    Physical Exam: Physical Exam Vitals and nursing note reviewed.  Constitutional:      General: She is not in acute distress.    Appearance: Normal appearance. She is not ill-appearing, toxic-appearing or diaphoretic.  HENT:     Head: Normocephalic and atraumatic.     Nose: Nose normal.     Mouth/Throat:     Mouth: Mucous membranes are moist.  Eyes:     General:        Right eye: No discharge.        Left eye: No discharge.  Cardiovascular:     Rate and Rhythm: Normal rate.  Pulmonary:     Effort: Pulmonary effort is normal. No respiratory distress.     Breath sounds: No wheezing.  Abdominal:     General: There is no distension.  Musculoskeletal:        General: Normal range of motion.     Cervical back: Normal range of motion.  Skin:    General: Skin is warm.  Neurological:     Mental Status: She is alert and oriented to person, place, and time.   Psychiatric:        Attention and Perception: Attention and perception normal. She is attentive. She does not perceive auditory or visual hallucinations.        Mood and Affect: Mood is anxious. Mood is not depressed.        Speech: Speech is rapid and pressured.        Behavior: Behavior normal. Behavior is cooperative.        Thought Content: Thought content is not paranoid or delusional. Thought content does not include homicidal or suicidal ideation. Thought content does not include homicidal or suicidal plan.        Cognition and Memory: Cognition normal.   Review of Systems  Constitutional: Negative.  Negative for chills and fever.  HENT: Negative.    Eyes: Negative.   Respiratory: Negative.    Cardiovascular: Negative.   Gastrointestinal: Negative.   Genitourinary: Negative.   Musculoskeletal: Negative.   Skin: Negative.   Neurological: Negative.   Endo/Heme/Allergies: Negative.   Psychiatric/Behavioral:  Positive for substance abuse. The patient  is nervous/anxious and has insomnia.   Blood pressure (!) 145/108, pulse 92, temperature 98 F (36.7 C), temperature source Oral, resp. rate 18, SpO2 100 %. There is no height or weight on file to calculate BMI.  Treatment Plan Summary: Daily contact with patient to assess and evaluate symptoms and progress in treatment and Medication management  Observation Level/Precautions:  Continuous Observation  Laboratory:  CBC Chemistry Profile HCG UDS  Psychotherapy:    Medications:    Consultations:    Discharge Concerns:    Estimated LOS:  Other:     Physician Treatment Plan for Primary Diagnosis: <principal problem not specified> Long Term Goal(s): Improvement in symptoms so as ready for discharge  Short Term Goals: Ability to identify changes in lifestyle to reduce recurrence of condition will improve, Ability to disclose and discuss suicidal ideas, Ability to demonstrate self-control will improve, Ability to maintain clinical  measurements within normal limits will improve, Compliance with prescribed medications will improve, and Ability to identify triggers associated with substance abuse/mental health issues will improve  Physician Treatment Plan for Secondary Diagnosis: Active Problems:   Schizoaffective disorder, bipolar type (HCC)   Bipolar 1 disorder (HCC)  Long Term Goal(s): Improvement in symptoms so as ready for discharge  Short Term Goals: Ability to identify changes in lifestyle to reduce recurrence of condition will improve, Ability to verbalize feelings will improve, Ability to disclose and discuss suicidal ideas, Ability to demonstrate self-control will improve, Ability to identify and develop effective coping behaviors will improve, Ability to maintain clinical measurements within normal limits will improve, Compliance with prescribed medications will improve, and Ability to identify triggers associated with substance abuse/mental health issues will improve  I certify that inpatient services furnished can reasonably be expected to improve the patient's condition.    Maricela Bo, NP 7/17/20221:46 AM

## 2020-11-11 LAB — LIPID PANEL
Cholesterol: 99 mg/dL (ref 0–200)
HDL: 24 mg/dL — ABNORMAL LOW (ref >40)
LDL Cholesterol: 61 mg/dL (ref 0–99)
Total CHOL/HDL Ratio: 4.1 RATIO
Triglycerides: 69 mg/dL (ref <150)
VLDL: 14 mg/dL (ref 0–40)

## 2020-11-11 LAB — BASIC METABOLIC PANEL
Anion gap: 7 (ref 5–15)
BUN: 9 mg/dL (ref 6–20)
CO2: 22 mmol/L (ref 22–32)
Calcium: 9.3 mg/dL (ref 8.9–10.3)
Chloride: 109 mmol/L (ref 98–111)
Creatinine, Ser: 0.84 mg/dL (ref 0.44–1.00)
GFR, Estimated: >60 mL/min (ref >60)
Glucose, Bld: 88 mg/dL (ref 70–99)
Potassium: 4 mmol/L (ref 3.5–5.1)
Sodium: 138 mmol/L (ref 135–145)

## 2020-11-11 LAB — GLUCOSE, CAPILLARY: Glucose-Capillary: 89 mg/dL (ref 70–99)

## 2020-11-11 LAB — HEMOGLOBIN A1C
Hgb A1c MFr Bld: 5.2 % (ref 4.8–5.6)
Mean Plasma Glucose: 102.54 mg/dL

## 2020-11-11 LAB — LAMOTRIGINE LEVEL: Lamotrigine Lvl: 1.8 ug/mL — ABNORMAL LOW (ref 2.0–20.0)

## 2020-11-11 MED ORDER — LAMOTRIGINE 25 MG PO TABS
50.0000 mg | ORAL_TABLET | Freq: Once | ORAL | Status: AC
  Administered 2020-11-12: 50 mg via ORAL
  Filled 2020-11-11: qty 2

## 2020-11-11 MED ORDER — OLANZAPINE 10 MG PO TBDP
10.0000 mg | ORAL_TABLET | Freq: Every day | ORAL | Status: DC
  Administered 2020-11-11: 10 mg via ORAL
  Filled 2020-11-11 (×2): qty 1

## 2020-11-11 MED ORDER — OLANZAPINE 5 MG PO TBDP
5.0000 mg | ORAL_TABLET | Freq: Every day | ORAL | Status: DC
  Administered 2020-11-12 – 2020-11-13 (×2): 5 mg via ORAL
  Filled 2020-11-11 (×3): qty 1

## 2020-11-11 MED ORDER — LAMOTRIGINE 25 MG PO TABS
25.0000 mg | ORAL_TABLET | Freq: Once | ORAL | Status: DC
  Filled 2020-11-11 (×2): qty 1

## 2020-11-11 NOTE — Plan of Care (Signed)
°  Problem: Education: °Goal: Emotional status will improve °Outcome: Progressing °Goal: Mental status will improve °Outcome: Progressing °  °Problem: Activity: °Goal: Interest or engagement in activities will improve °Outcome: Progressing °  °
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
## 2020-11-11 NOTE — Progress Notes (Addendum)
[facility] MD Progress Note  11/11/2020 12:32 PM Samantha Ashley  MRN:  [id number] Subjective:   Samantha Ashley is a 67 YOF with a PPHx of schizoaffective d/o, bipolar type, as well as cannabis use disorder, presenting under IVC with concern for mania and a potential suicide attempt.  On interview this morning, patient is improved from the previous day. Her speech is less pressured and she pauses when she is finished with her thoughts. She reports doing well. Her sleep last night is documented at only 3.75 hrs (as of 5 AM). Pt reports sleeping well. Her thought process in linear and logical. She apologizes for her behavior in group yesterday (see LCSW note) and reports that she had a conversation with her father. Apparently this conversation went poorly, because the father was not appropriately concerned with her feelings and was focused on her mother's wellbeing. Patient reports a rash on her L arm and on her leg under her tattoo there (see description below). She feels this rash started after she received Geodon at [facility]    Principal Problem: Schizoaffective disorder, bipolar type (HCC) Diagnosis: Principal Problem:   Schizoaffective disorder, bipolar type (HCC) Active Problems:   Bipolar 1 disorder (HCC)  Total Time spent in direct patient care: 45 minutes  Past Psychiatric History: as above  Past Medical History:  Past Medical History:  Diagnosis Date   Asthma    History reviewed. No pertinent surgical history. Family History:  Family History  Problem Relation Age of Onset   Healthy Mother    Healthy Father    Family Psychiatric  History: denies any formal diagnoses, but suspects bipolar d/o Social History:  Social History   Substance and Sexual Activity  Alcohol Use Not Currently     Social History   Substance and Sexual Activity  Drug Use Yes   Types: Marijuana    Social History   Socioeconomic History   Marital status: Single    Spouse name: Not on file   Number of  children: Not on file   Years of education: Not on file   Highest education level: Not on file  Occupational History   Not on file  Tobacco Use   Smoking status: Every Day    Types: Cigars   Smokeless tobacco: Never  Substance and Sexual Activity   Alcohol use: Not Currently   Drug use: Yes    Types: Marijuana   Sexual activity: Yes    Birth control/protection: Pill  Other Topics Concern   Not on file  Social History Narrative   Not on file   Social Determinants of Health   Financial Resource Strain: Not on file  Food Insecurity: Not on file  Transportation Needs: Not on file  Physical Activity: Not on file  Stress: Not on file  Social Connections: Not on file   Additional Social History:     Sleep: Poor  Appetite:  Fair  Current Medications: Current Facility-Administered Medications  Medication Dose Route Frequency Provider Last Rate Last Admin   acetaminophen (TYLENOL) tablet 650 mg  650 mg Oral Q6H PRN Ajibola, Ene A, NP       alum & mag hydroxide-simeth (MAALOX/MYLANTA) 200-200-20 MG/5ML suspension 30 mL  30 mL Oral Q4H PRN Ajibola, Ene A, NP       lamoTRIgine (LAMICTAL) tablet 100 mg  100 mg Oral Daily Leevy-Johnson, Brooke A, NP   100 mg at 11/11/20 0725   magnesium hydroxide (MILK OF MAGNESIA) suspension 30 mL  30 mL Oral Daily  PRN Ajibola, Ene A, NP       metFORMIN (GLUCOPHAGE) tablet 500 mg  500 mg Oral BID Ajibola, Ene A, NP   500 mg at 11/11/20 0725   Norethindrone Acetate-Ethinyl Estradiol (LOESTRIN) 1.5-30 MG-MCG tablet 1 tablet  1 tablet Oral Daily Antonieta Pert, MD   1 tablet at 11/11/20 0725   OLANZapine zydis (ZYPREXA) disintegrating tablet 5 mg  5 mg Oral BID Ajibola, Ene A, NP   5 mg at 11/11/20 8938   traZODone (DESYREL) tablet 50 mg  50 mg Oral QHS PRN Ajibola, Ene A, NP   50 mg at 11/09/20 2355    Lab Results:  Results for orders placed or performed during the hospital encounter of 11/09/20 (from the past 48 hour(s))  Hemoglobin A1c      Status: None   Collection Time: 11/10/20  5:45 PM  Result Value Ref Range   Hgb A1c MFr Bld 5.2 4.8 - 5.6 %    Comment: (NOTE) Pre diabetes:          5.7%-6.4%  Diabetes:              >6.4%  Glycemic control for   <7.0% adults with diabetes    Mean Plasma Glucose 102.54 mg/dL    Comment: Performed at [facility], [address], [place], [place] [address]  Glucose, capillary     Status: None   Collection Time: 11/11/20  6:05 AM  Result Value Ref Range   Glucose-Capillary 89 70 - 99 mg/dL    Comment: Glucose reference range applies only to samples taken after fasting for at least 8 hours.   Comment 1 Notify RN    Comment 2 Document in Chart   Lipid panel     Status: Abnormal   Collection Time: 11/11/20  6:16 AM  Result Value Ref Range   Cholesterol 99 0 - 200 mg/dL   Triglycerides 69 <102 mg/dL   HDL 24 (L) >58 mg/dL   Total CHOL/HDL Ratio 4.1 RATIO   VLDL 14 0 - 40 mg/dL   LDL Cholesterol 61 0 - 99 mg/dL    Comment:        Total Cholesterol/HDL:CHD Risk Coronary Heart Disease Risk Table                     Men   Women  1/2 Average Risk   3.4   3.3  Average Risk       5.0   4.4  2 X Average Risk   9.6   7.1  3 X Average Risk  23.4   11.0        Use the calculated Patient Ratio above and the CHD Risk Table to determine the patient's CHD Risk.        ATP III CLASSIFICATION (LDL):  <100     mg/dL   Optimal  527-782  mg/dL   Near or Above                    Optimal  130-159  mg/dL   Borderline  423-536  mg/dL   High  >144     mg/dL   Very High Performed at [facility], [address], [place], [place] [address]   Basic metabolic panel     Status: None   Collection Time: 11/11/20  6:16 AM  Result Value Ref Range   Sodium 138 135 - 145 mmol/L   Potassium 4.0 3.5 -  5.1 mmol/L   Chloride 109 98 - 111 mmol/L   CO2 22 22 - 32 mmol/L   Glucose, Bld 88 70 - 99 mg/dL    Comment: Glucose reference range applies only to samples taken after  fasting for at least 8 hours.   BUN 9 6 - 20 mg/dL   Creatinine, Ser 7.68 0.44 - 1.00 mg/dL   Calcium 9.3 8.9 - 11.5 mg/dL   GFR, Estimated >72 >62 mL/min    Comment: (NOTE) Calculated using the CKD-EPI Creatinine Equation (2021)    Anion gap 7 5 - 15    Comment: Performed at [facility], [address], [place], [place] [address]    Blood Alcohol level:  Lab Results  Component Value Date   ETH <10 11/07/2020   ETH <10 10/25/2018    Metabolic Disorder Labs: Lab Results  Component Value Date   HGBA1C 5.2 11/10/2020   MPG 102.54 11/10/2020   MPG 99.67 08/20/2020   Lab Results  Component Value Date   PROLACTIN 98.3 (H) 10/29/2018   PROLACTIN 52.5 (H) 10/11/2018   Lab Results  Component Value Date   CHOL 99 11/11/2020   TRIG 69 11/11/2020   HDL 24 (L) 11/11/2020   CHOLHDL 4.1 11/11/2020   VLDL 14 11/11/2020   LDLCALC 61 11/11/2020   LDLCALC 44 08/20/2020    Physical Findings: AIMS:  , ,  ,  ,    CIWA:    COWS:     Musculoskeletal: Strength & Muscle Tone: within normal limits Gait & Station: normal Patient leans: N/A  Psychiatric Specialty Exam:  Presentation  General Appearance: Appropriate for Environment  Eye Contact:Good  Speech:Pressured  Speech Volume:Increased  Handedness:Right   Mood and Affect  Mood:Anxious; Angry  Affect:Congruent   Thought Process  Thought Processes:Goal Directed; Disorganized  Descriptions of Associations:Intact  Orientation:Full (Time, Place and Person)  Thought Content:Logical; Perseveration  History of Schizophrenia/Schizoaffective disorder:Yes  Duration of Psychotic Symptoms:Greater than six months  Hallucinations:Hallucinations: None Description of Auditory Hallucinations: None  Ideas of Reference:None  Suicidal Thoughts:Suicidal Thoughts: No SI Passive Intent and/or Plan: Without Intent; Without Plan  Homicidal Thoughts:Homicidal Thoughts: No   Sensorium  Memory:Immediate  Good; Recent Good; Remote Good  Judgment:Poor  Insight:Poor   Executive Functions  Concentration:Fair  Attention Span:Fair  Recall:Fair  Fund of Knowledge:Fair  Language:Fair   Psychomotor Activity  Psychomotor Activity:Psychomotor Activity: Normal   Assets  Assets:Physical Health; Housing; Social Support; Communication Skills; Desire for Improvement   Sleep  Sleep:Sleep: Poor Number of Hours of Sleep: 4    Physical Exam: Physical Exam Vitals reviewed.  Constitutional:      Appearance: Normal appearance.  HENT:     Head: Normocephalic and atraumatic.  Eyes:     Extraocular Movements: Extraocular movements intact.  Cardiovascular:     Rate and Rhythm: Normal rate and regular rhythm.  Pulmonary:     Effort: Pulmonary effort is normal.  Musculoskeletal:        General: Normal range of motion.  Neurological:     Mental Status: She is alert.  Dermatologic: 3 red nodules on the medial L forearm, leg rash not assessed given current psychiatric condition.  Review of Systems  Respiratory:  Negative for shortness of breath.   Cardiovascular:  Negative for chest pain.  Neurological:  Negative for headaches.  Blood pressure (!) 129/95, pulse (!) 110, temperature 97.8 F (36.6 C), temperature source Oral, resp. rate 16, height 5\' 5"  (1.651 m), weight 114 kg, last menstrual  period 11/10/2020, SpO2 100 %. Body mass index is 41.82 kg/m.   Assessment Samantha Ashley is a [age] YOF with a PPHx of schizoaffective d/o, bipolar type, as well as cannabis use disorder, presenting under IVC with concern for mania and a potential suicide attempt. Patient exhibits signs of mania (pressured speech, emotional lability, irritability, and hyper-religiosity), however, she also exhibits cluster B personality traits. Patient appears improved from previous day with Zyprexa, will continue.   Treatment Plan Summary: Daily contact with patient to assess and evaluate symptoms and progress in  treatment and Medication management   Safety and Monitoring -- INVOLUNTARY admission to inpatient psychiatric unit for safety, stabilization and treatment -- Daily contact with patient to assess and evaluate symptoms and progress in treatment -- Patient's case to be discussed in multi-disciplinary team meeting -- Observation Level : q15 minute checks -- Vital signs:  q12 hours -- Precautions: suicide  Symptoms consistent with mania, Hx schizoaffective d/o bipolar type Patient restarted on home Lamictal 100 mg daily - Taper off given rash and lack of efficacy   -50 mg (7/19)  -25 mg (7/20) Zyprexa 5 mg bid (started 7/17) Increase to Zyprexa 5 mg AM / 10 mg QHS (7/18), given poor sleep last two nights Antipsychotic labs  EKG: NSR, Qtc 475 (7/14), recheck with Qtc 460 (7/17)  Lipids: HDL of 24, otherwise wnl  [id number]: 5.2 Weight gain may limit long term compliance with Zyprexa  Cannabis Use Disorder -Encourage abstinence -Motivational interviewing  Medical Management Covid neg CBC w WBC of 10.6, no other signs of infection CMP w Na of 132, no clinical signs of hyponatremia, repeat ordered - wnl (7/18) EtOH <10 UDS: benzos (pt has an Rx for Klonopin from 08/2020) and THC Upreg neg Pt on MF 500 mg bid as a home med (last A1C was 5.1, 07/2020) Pt on Loestrin OCP, continue  Continue PRNs: Tylenol, Maalox, Milk of Magnesia, Trazodone  Carlyn ReichertNick Maddux First PGY-1, Psychiatry

## 2020-11-11 NOTE — Plan of Care (Addendum)
D:  Patient's self inventory sheet, patient sleeps good, sleep medication helpful.  Good appetite, high energy level, good concentration.  Rated depression #2, denied hopeless, rated anxiety #5.  Denied SI.  Denied withdrawals.  Physical pain denied.  Goal is put her feelings above others respectfully.  Plans to talk to fiance above others respectfully.  Talk to fiance about feelings.  Thank you.!  Does have discharge plans.  Extended time in hospital? A:  Medications administered per MD orders.  Emotional support and encouragement given patient. R:  Denied SI and HI, contracts for safety.  Denied A/V hallucinations.  Safety maintained with 25 minute checks.

## 2020-11-11 NOTE — Progress Notes (Signed)
Recreation Therapy Notes  Date: 7.18.22 Time: 0930 Location: [address] Dayroom   Group Topic: Stress Management   Goal Area(s) Addresses: Patient will actively participate in stress management techniques presented during session. Patient will successfully identify benefit of practicing stress management post d/c.   Behavioral Response: Appropriate   Intervention: Guided exercise with ambient sound and script   Activity : Meditation   LRT provided education and instruction for the stress management technique of meditation.  Patients were asked to participate in the technique introduced during session.  LRT left the floor open for any questions or concerns of the patients.  Patients were given suggestions of ways to access scripts post d/c and encouraged to explore Youtube and other apps available on smartphones, tablets, and computers.   Education:  Stress Management, Discharge Planning.   Education Outcome: Acknowledges education   Clinical Observations/Feedback: Patient actively engaged in technique introduced, expressed no concerns at conclusion of group session.       Boysie Bonebrake, LRT/CTRS         Gethsemane Fischler A 11/11/2020 12:29 PM 

## 2020-11-11 NOTE — Plan of Care (Addendum)
Nurse discussed coping skills with patient.  

## 2020-11-11 NOTE — Tx Team (Signed)
Interdisciplinary Treatment and Diagnostic Plan Update  11/11/2020 Time of Session: 1:10pm [name] MRN: [id number]  Principal Diagnosis: Schizoaffective disorder, bipolar type ([place])  Secondary Diagnoses: Principal Problem:   Schizoaffective disorder, bipolar type ([place]) Active Problems:   Bipolar 1 disorder ([place])   Current Medications:  Current Facility-Administered Medications  Medication Dose Route Frequency Provider Last Rate Last Admin   acetaminophen (TYLENOL) tablet 650 mg  650 mg Oral Q6H PRN Ajibola, Ene A, NP       alum & mag hydroxide-simeth (MAALOX/MYLANTA) 200-200-20 MG/5ML suspension 30 mL  30 mL Oral Q4H PRN Ajibola, Ene A, NP       [START ON 11/13/2020] lamoTRIgine (LAMICTAL) tablet 25 mg  25 mg Oral Once Corky Sox, MD       Derrill Memo ON 11/12/2020] lamoTRIgine (LAMICTAL) tablet 50 mg  50 mg Oral Once Corky Sox, MD       magnesium hydroxide (MILK OF MAGNESIA) suspension 30 mL  30 mL Oral Daily PRN Ajibola, Ene A, NP       metFORMIN (GLUCOPHAGE) tablet 500 mg  500 mg Oral BID Ajibola, Ene A, NP   500 mg at 11/11/20 0725   Norethindrone Acetate-Ethinyl Estradiol (LOESTRIN) 1.5-30 MG-MCG tablet 1 tablet  1 tablet Oral Daily Sharma Covert, MD   1 tablet at 11/11/20 0725   OLANZapine zydis (ZYPREXA) disintegrating tablet 10 mg  10 mg Oral QHS Corky Sox, MD       Derrill Memo ON 11/12/2020] OLANZapine zydis (ZYPREXA) disintegrating tablet 5 mg  5 mg Oral Daily Corky Sox, MD       traZODone (DESYREL) tablet 50 mg  50 mg Oral QHS PRN Ajibola, Ene A, NP   50 mg at 11/09/20 2355   PTA Medications: Medications Prior to Admission  Medication Sig Dispense Refill Last Dose   acetaminophen (TYLENOL) 500 MG tablet Take 500-1,000 mg by mouth every 6 (six) hours as needed for mild pain or headache.      albuterol (VENTOLIN HFA) 108 (90 Base) MCG/ACT inhaler Inhale 2 puffs into the lungs every 6 (six) hours as needed for wheezing or shortness of breath.       aspirin 325 MG EC tablet Take 325 mg by mouth daily as needed (for headaches).      aspirin-acetaminophen-caffeine (EXCEDRIN MIGRAINE) 250-250-65 MG tablet Take 1 tablet by mouth every 6 (six) hours as needed for headache.      benztropine (COGENTIN) 1 MG tablet Take 1 tablet (1 mg total) by mouth 2 (two) times daily as needed for tremors (EPS). (Patient not taking: Reported on 11/08/2020) 30 tablet 0    diphenhydrAMINE (BENADRYL) 25 mg capsule Take 25 mg by mouth every 6 (six) hours as needed for allergies.      divalproex (DEPAKOTE ER) 500 MG 24 hr tablet Take 2 tablets (1,000 mg total) by mouth at bedtime. (Patient not taking: No sig reported) 60 tablet 0    fluticasone (FLONASE) 50 MCG/ACT nasal spray Place 1 spray into both nostrils daily. (Patient taking differently: Place 1-2 sprays into both nostrils daily as needed for allergies or rhinitis.) 9.9 mL 0    haloperidol (HALDOL) 10 MG tablet Take 1 tablet (10 mg total) by mouth at bedtime. (Patient not taking: No sig reported) 30 tablet 0    haloperidol (HALDOL) 5 MG tablet Take 1 tablet (5 mg total) by mouth 2 (two) times daily. (Patient not taking: No sig reported) 60 tablet 0    JUNEL FE 1.5/30 1.5-30 MG-MCG tablet  Take 1 tablet by mouth at bedtime.      lamoTRIgine (LAMICTAL) 100 MG tablet Take 100 mg by mouth at bedtime.      metFORMIN (GLUCOPHAGE) 500 MG tablet Take 500 mg by mouth 2 (two) times daily.      traZODone (DESYREL) 100 MG tablet Take 2 tablets (200 mg total) by mouth at bedtime. (Patient not taking: Reported on 11/08/2020) 60 tablet 0    traZODone (DESYREL) 50 MG tablet Take 50 mg by mouth at bedtime.       Patient Stressors: Substance abuse Other: Anniversary of sister in law coming up in September  Patient Strengths: Ability for insight Capable of independent living Communication skills Supportive family/friends Work skills  Treatment Modalities: Medication Management, Group therapy, Case management,  1 to 1 session  with clinician, Psychoeducation, Recreational therapy.   Physician Treatment Plan for Primary Diagnosis: Schizoaffective disorder, bipolar type ([name]) Long Term Goal(s): Improvement in symptoms so as ready for discharge   Short Term Goals: Ability to identify changes in lifestyle to reduce recurrence of condition will improve Ability to verbalize feelings will improve Ability to disclose and discuss suicidal ideas Ability to demonstrate self-control will improve Ability to identify and develop effective coping behaviors will improve Ability to maintain clinical measurements within normal limits will improve Compliance with prescribed medications will improve Ability to identify triggers associated with substance abuse/mental health issues will improve  Medication Management: Evaluate patient's response, side effects, and tolerance of medication regimen.  Therapeutic Interventions: 1 to 1 sessions, Unit Group sessions and Medication administration.  Evaluation of Outcomes: Not Met  Physician Treatment Plan for Secondary Diagnosis: Principal Problem:   Schizoaffective disorder, bipolar type ([name]) Active Problems:   Bipolar 1 disorder ([place])  Long Term Goal(s): Improvement in symptoms so as ready for discharge   Short Term Goals: Ability to identify changes in lifestyle to reduce recurrence of condition will improve Ability to verbalize feelings will improve Ability to disclose and discuss suicidal ideas Ability to demonstrate self-control will improve Ability to identify and develop effective coping behaviors will improve Ability to maintain clinical measurements within normal limits will improve Compliance with prescribed medications will improve Ability to identify triggers associated with substance abuse/mental health issues will improve     Medication Management: Evaluate patient's response, side effects, and tolerance of medication regimen.  Therapeutic Interventions: 1 to 1  sessions, Unit Group sessions and Medication administration.  Evaluation of Outcomes: Not Met   RN Treatment Plan for Primary Diagnosis: Schizoaffective disorder, bipolar type ([name]) Long Term Goal(s): Knowledge of disease and therapeutic regimen to maintain health will improve  Short Term Goals: Ability to remain free from injury will improve, Ability to verbalize frustration and anger appropriately will improve, Ability to demonstrate self-control, Ability to participate in decision making will improve, Ability to verbalize feelings will improve, Ability to identify and develop effective coping behaviors will improve, and Compliance with prescribed medications will improve  Medication Management: RN will administer medications as ordered by provider, will assess and evaluate patient's response and provide education to patient for prescribed medication. RN will report any adverse and/or side effects to prescribing provider.  Therapeutic Interventions: 1 on 1 counseling sessions, Psychoeducation, Medication administration, Evaluate responses to treatment, Monitor vital signs and CBGs as ordered, Perform/monitor CIWA, COWS, AIMS and Fall Risk screenings as ordered, Perform wound care treatments as ordered.  Evaluation of Outcomes: Not Met   LCSW Treatment Plan for Primary Diagnosis: Schizoaffective disorder, bipolar type ([place]) Long Term Goal(s):  Safe transition to appropriate next level of care at discharge, Engage patient in therapeutic group addressing interpersonal concerns.  Short Term Goals: Engage patient in aftercare planning with referrals and resources, Increase ability to appropriately verbalize feelings, Increase emotional regulation, Identify triggers associated with mental health/substance abuse issues, and Increase skills for wellness and recovery  Therapeutic Interventions: Assess for all discharge needs, 1 to 1 time with Social worker, Explore available resources and support  systems, Assess for adequacy in community support network, Educate family and significant other(s) on suicide prevention, Complete Psychosocial Assessment, Interpersonal group therapy.  Evaluation of Outcomes: Not Met   Progress in Treatment: Attending groups: Yes. Participating in groups: Yes. Taking medication as prescribed: Yes. Toleration medication: Yes. Family/Significant other contact made: No, will contact:  if consent is given Patient understands diagnosis: Yes. Discussing patient identified problems/goals with staff: Yes. Medical problems stabilized or resolved: Yes. Denies suicidal/homicidal ideation: Yes. Issues/concerns per patient self-inventory: No.   New problem(s) identified: No, Describe:  none  New Short Term/Long Term Goal(s): medication stabilization, elimination of SI thoughts, development of comprehensive mental wellness plan.    Patient Goals:  "To go home. To communicate better"  Discharge Plan or Barriers: Patient recently admitted. CSW will continue to follow and assess for appropriate referrals and possible discharge planning.    Reason for Continuation of Hospitalization: Anxiety Depression Medication stabilization  Estimated Length of Stay: 3-5 days  Attendees: Patient: Samantha Ashley 11/11/2020   Physician: Lestine Mount, DO 11/11/2020   Nursing:  11/11/2020   RN Care Manager: 11/11/2020   Social Worker: Darletta Moll, LCSW 11/11/2020   Recreational Therapist:  11/11/2020   Other:  11/11/2020   Other:  11/11/2020   Other: 11/11/2020    Scribe for Treatment Team: Vassie Moselle, LCSW 11/11/2020 2:13 PM

## 2020-11-11 NOTE — [facility] Group Notes (Signed)
LCSW Group Therapy Note        11/11/2020:  1pm-2pm                 Type of Therapy and Topic:  Group Therapy: Establishing Boundaries     Participation Level:  Active      Description of Group:     In this group, patients learned how to define boundaries, discussed the different types or boundaries with examples.  They identified times that boundaries had been violated and how they reacted.  They analyzed how their reaction was possibly beneficial and how it was possibly unhelpful.  The group discussed how to set boundaries, respect others boundaries and communicate their boundaries. The group utilized a role play scenarios (working with a partner) and discussed how each person in the scenario could have reacted differently and what boundaries they need to implement to improve their life. Patients also discussed consequences to overstepping boundaries and lack of boundaries. Patients discussed how to establish boundaries with clear consequences. Patients will explore discussion questions that address media influence and why it is hard to set boundaries.         Therapeutic Goals:   Patients will define boundaries and explore (physical, personal space and language boundaries). Patients will remember their last incident where their boundaries were violated and how they behaved. Patients will practice empathy and understanding of other's boundaries and learn from others in group. Patients will explore how they may have crossed another person's boundaries in the past.   Patients will learn healthy ways to set and communicate boundaries. Patients will actively engage in group activity utilizing role play and critical thinking skills.       Summary of Patient Progress:  Patient came to group upset but at the end of group was able to appropriately participate with peers.  Patient actively engaged in discussion about healthy outcomes to setting boundaries and was able to define a boundary in  their own words. Patient actively participated in scenarios and different ways to say no when presented with a situation that they need to make a boundary. Patient discussed issues she has with setting boundaries with her family and problem solved with peers on how to handle those boundaries.        Therapeutic Modalities:    Cognitive Behavioral Therapy      Larz Mark, LCSW, LCAS Clincal Social Worker [facility]

## 2020-11-11 NOTE — Progress Notes (Signed)
[facility] Group Notes:  (Nursing/MHT/Case Management/Adjunct)  Date:  11/11/2020  Time:  10:11 PM  Type of Therapy:  Psychoeducational Skills  Participation Level:  Active  Participation Quality:  Appropriate, Sharing, and Supportive  Affect:  Appropriate  Cognitive:  Alert, Appropriate, and Oriented  Insight:  Appropriate and Good  Engagement in Group:  Engaged  Modes of Intervention:  Problem-solving  Summary of Progress/Problems: Samantha Ashley shared with the group how her mood was up and down on today but she has been trying new medication.  She also shared she hopes the medication helps her sleep because she has insomnia.  She is looking forward being discharge once all her medication works for her.    Samantha Ashley [place] [facility] 11/11/2020, 10:11 PM

## 2020-11-11 NOTE — Progress Notes (Signed)
Samantha Ashley was up and visible on the unit.  She was pleasant and cooperative.  She interacts well with staff and peers.  She did attend evening wrap up group with good participation.  She denied SI/HI or AVH.  She reported she has had some mood swings but is feeling better over all.  She took her hs medications and prn trazodone without difficulty.  She is currently resting with her eyes closed and appears to be asleep.  Q 15 minute checks maintained for safety.      11/11/20 2130  Psych Admission Type (Psych Patients Only)  Admission Status Involuntary  Psychosocial Assessment  Patient Complaints Insomnia  Eye Contact Fair  Facial Expression Fixed smile  Affect Appropriate to circumstance  Speech Logical/coherent  Interaction Assertive  Motor Activity Other (Comment) (unremarkable)  Appearance/Hygiene Unremarkable  Behavior Characteristics Appropriate to situation  Mood Pleasant  Thought Process  Coherency WDL  Content WDL  Delusions None reported or observed  Perception WDL  Hallucination None reported or observed  Judgment Poor  Confusion None  Danger to Self  Current suicidal ideation? Denies  Danger to Others  Danger to Others None reported or observed

## 2020-11-12 ENCOUNTER — Encounter (HOSPITAL_COMMUNITY): Payer: Self-pay | Admitting: Psychiatry

## 2020-11-12 DIAGNOSIS — F250 Schizoaffective disorder, bipolar type: Principal | ICD-10-CM

## 2020-11-12 LAB — TSH: TSH: 1.367 u[IU]/mL (ref 0.350–4.500)

## 2020-11-12 LAB — GLUCOSE, CAPILLARY: Glucose-Capillary: 95 mg/dL (ref 70–99)

## 2020-11-12 MED ORDER — WHITE PETROLATUM EX OINT
TOPICAL_OINTMENT | CUTANEOUS | Status: AC
  Administered 2020-11-12: 1
  Filled 2020-11-12: qty 5

## 2020-11-12 MED ORDER — OLANZAPINE 15 MG PO TBDP
15.0000 mg | ORAL_TABLET | Freq: Every day | ORAL | Status: DC
  Administered 2020-11-12: 15 mg via ORAL
  Filled 2020-11-12 (×2): qty 1

## 2020-11-12 NOTE — [facility] Suicide Risk Assessment (Signed)
[facility] INPATIENT:  Family/Significant Other Suicide Prevention Education  Suicide Prevention Education:  Contact Attempts: fiance, Florinda Marker [phone]), has been identified by the patient as the family member/significant other with whom the patient will be residing, and identified as the person(s) who will aid the patient in the event of a mental health crisis.  With written consent from the patient, two attempts were made to provide suicide prevention education, prior to and/or following the patient's discharge.  We were unsuccessful in providing suicide prevention education.  A suicide education pamphlet was given to the patient to share with family/significant other.  Date and time of first attempt:11/12/2020 / 12:05pm CSW left a HIPAA compliant voicemail for this pt's fiance. Date and time of second attempt: CSW will attempt to reach pt's supports again.    Samantha Ashley A Adreana Coull 11/12/2020, 12:06 PM

## 2020-11-12 NOTE — Progress Notes (Addendum)
Adult Psychoeducational Group Note  Date:  11/12/2020 Time:  4:14 PM  Group Topic/Focus:  Goals Group:   The focus of this group is to help patients establish daily goals to achieve during treatment and discuss how the patient can incorporate goal setting into their daily lives to aide in recovery.  Participation Level:  Active  Participation Quality:  Appropriate  Affect:  Appropriate  Cognitive:  Appropriate  Insight: Appropriate  Engagement in Group:  Engaged  Modes of Intervention:  Discussion  Additional Comments:  Patient attended goal group and participated.   Raif Chachere W Zackeriah Kissler 11/12/2020, 4:14 PM

## 2020-11-12 NOTE — Progress Notes (Signed)
Recreation Therapy Notes  Animal-Assisted Activity (AAA) Program Checklist/Progress Notes Patient Eligibility Criteria Checklist & Daily Group note for Rec Tx Intervention  Date: 7.19.22 Time: 1430 Location: [address] Dayroom   AAA/T Program Assumption of Risk Form signed by Patient/ or Parent Legal Guardian  YES   Patient is free of allergies or severe asthma  YES   Patient reports no fear of animals  YES  Patient reports no history of cruelty to animals  YES  Patient understands his/her participation is voluntary  YES  Patient washes hands before animal contact YES   Patient washes hands after animal contact YES  Behavioral Response: Engaged  Education: Hand Washing, Appropriate Animal Interaction   Education Outcome: Acknowledges understanding/In group clarification offered/Needs additional education.   Clinical Observations/Feedback: Pt attended and was attentive during the activity.    Samantha Ashley, LRT/CTRS        Samantha Ashley A 11/12/2020 3:31 PM 

## 2020-11-12 NOTE — [facility] Group Notes (Addendum)
Adult Psychoeducational Group Note  Date:  11/12/2020 Time:  9:41 PM  Group Topic/Focus:  Wrap-Up Group:   The focus of this group is to help patients review their daily goal of treatment and discuss progress on daily workbooks.  Participation Level:  Active  Participation Quality:  Appropriate and Attentive  Affect:  Appropriate  Cognitive:  Alert and Appropriate  Insight: Appropriate and Good  Engagement in Group:  Engaged  Modes of Intervention:  Discussion and Education  Additional Comments:  Pt attended and participated in wrap up group this evening and rated their day a 9.5/10, due to them having a great day. Pt stated that they are working on "finding coping skills to deal with their manic behavior" and to receive med management.   Chrisandra Netters 11/12/2020, 9:41 PM

## 2020-11-12 NOTE — Progress Notes (Addendum)
[facility] MD Progress Note  11/12/2020 4:58 PM Samantha Ashley  MRN:  [id number] Subjective:   Samantha Ashley is a 18 YOF with a PPHx of schizoaffective d/o, bipolar type, as well as cannabis use disorder, presenting under IVC with concern for mania and a potential suicide attempt.  On interview this morning, patient's thought processes and mental status are about the same as yesterday. She reports sleeping better than previous days (verified by chart review). She reports that she has decided "not to have a relationship with my family" because they are not focused on her wellbeing. She reports a strong relationship with her fiance. When asked what her fiance thinks about her cannabis use, she reports that she disapproves. The patient rates her readiness to quit as a 5/10. Pt denies SI/HI/AVH/paranoia/medication side effects, ROS as below.    Principal Problem: Schizoaffective disorder, bipolar type (HCC) Diagnosis: Principal Problem:   Schizoaffective disorder, bipolar type (HCC) Active Problems:   Bipolar 1 disorder (HCC)  Total Time spent in direct patient care: 45 minutes  Past Psychiatric History: as above  Past Medical History:  Past Medical History:  Diagnosis Date   Asthma    History reviewed. No pertinent surgical history. Family History:  Family History  Problem Relation Age of Onset   Healthy Mother    Healthy Father    Family Psychiatric  History: denies any formal diagnoses, but suspects bipolar d/o Social History:  Social History   Substance and Sexual Activity  Alcohol Use Not Currently     Social History   Substance and Sexual Activity  Drug Use Yes   Types: Marijuana    Social History   Socioeconomic History   Marital status: Single    Spouse name: Not on file   Number of children: Not on file   Years of education: Not on file   Highest education level: Not on file  Occupational History   Not on file  Tobacco Use   Smoking status: Every Day    Types:  Cigars   Smokeless tobacco: Never  Substance and Sexual Activity   Alcohol use: Not Currently   Drug use: Yes    Types: Marijuana   Sexual activity: Yes    Birth control/protection: Pill  Other Topics Concern   Not on file  Social History Narrative   Not on file   Social Determinants of Health   Financial Resource Strain: Not on file  Food Insecurity: Not on file  Transportation Needs: Not on file  Physical Activity: Not on file  Stress: Not on file  Social Connections: Not on file   Additional Social History:     Sleep: Poor  Appetite:  Fair  Current Medications: Current Facility-Administered Medications  Medication Dose Route Frequency Provider Last Rate Last Admin   acetaminophen (TYLENOL) tablet 650 mg  650 mg Oral Q6H PRN Ajibola, Ene A, NP       alum & mag hydroxide-simeth (MAALOX/MYLANTA) 200-200-20 MG/5ML suspension 30 mL  30 mL Oral Q4H PRN Ajibola, Ene A, NP       magnesium hydroxide (MILK OF MAGNESIA) suspension 30 mL  30 mL Oral Daily PRN Ajibola, Ene A, NP       metFORMIN (GLUCOPHAGE) tablet 500 mg  500 mg Oral BID Ajibola, Ene A, NP   500 mg at 11/12/20 1005   Norethindrone Acetate-Ethinyl Estradiol (LOESTRIN) 1.5-30 MG-MCG tablet 1 tablet  1 tablet Oral Daily Antonieta Pert, MD   1 tablet at 11/12/20 1009  OLANZapine zydis (ZYPREXA) disintegrating tablet 15 mg  15 mg Oral QHS Claudie Revering, MD       OLANZapine zydis (ZYPREXA) disintegrating tablet 5 mg  5 mg Oral Daily Carlyn Reichert, MD   5 mg at 11/12/20 1007   traZODone (DESYREL) tablet 50 mg  50 mg Oral QHS PRN Ajibola, Ene A, NP   50 mg at 11/11/20 2130    Lab Results:  Results for orders placed or performed during the hospital encounter of 11/09/20 (from the past 48 hour(s))  Hemoglobin A1c     Status: None   Collection Time: 11/10/20  5:45 PM  Result Value Ref Range   Hgb A1c MFr Bld 5.2 4.8 - 5.6 %    Comment: (NOTE) Pre diabetes:          5.7%-6.4%  Diabetes:               >6.4%  Glycemic control for   <7.0% adults with diabetes    Mean Plasma Glucose 102.54 mg/dL    Comment: Performed at [facility], [address], [place], [place] [address]  Glucose, capillary     Status: None   Collection Time: 11/11/20  6:05 AM  Result Value Ref Range   Glucose-Capillary 89 70 - 99 mg/dL    Comment: Glucose reference range applies only to samples taken after fasting for at least 8 hours.   Comment 1 Notify RN    Comment 2 Document in Chart   Lipid panel     Status: Abnormal   Collection Time: 11/11/20  6:16 AM  Result Value Ref Range   Cholesterol 99 0 - 200 mg/dL   Triglycerides 69 <768 mg/dL   HDL 24 (L) >11 mg/dL   Total CHOL/HDL Ratio 4.1 RATIO   VLDL 14 0 - 40 mg/dL   LDL Cholesterol 61 0 - 99 mg/dL    Comment:        Total Cholesterol/HDL:CHD Risk Coronary Heart Disease Risk Table                     Men   Women  1/2 Average Risk   3.4   3.3  Average Risk       5.0   4.4  2 X Average Risk   9.6   7.1  3 X Average Risk  23.4   11.0        Use the calculated Patient Ratio above and the CHD Risk Table to determine the patient's CHD Risk.        ATP III CLASSIFICATION (LDL):  <100     mg/dL   Optimal  572-620  mg/dL   Near or Above                    Optimal  130-159  mg/dL   Borderline  355-974  mg/dL   High  >163     mg/dL   Very High Performed at [facility], [address] [place], [place] [address]   Basic metabolic panel     Status: None   Collection Time: 11/11/20  6:16 AM  Result Value Ref Range   Sodium 138 135 - 145 mmol/L   Potassium 4.0 3.5 - 5.1 mmol/L   Chloride 109 98 - 111 mmol/L   CO2 22 22 - 32 mmol/L   Glucose, Bld 88 70 - 99 mg/dL    Comment: Glucose reference range applies only to samples taken  after fasting for at least 8 hours.   BUN 9 6 - 20 mg/dL   Creatinine, Ser 4.85 0.44 - 1.00 mg/dL   Calcium 9.3 8.9 - 46.2 mg/dL   GFR, Estimated >70 >35 mL/min    Comment: (NOTE) Calculated using  the CKD-EPI Creatinine Equation (2021)    Anion gap 7 5 - 15    Comment: Performed at [facility], [address], [place], [place] [address]  Glucose, capillary     Status: None   Collection Time: 11/12/20  5:51 AM  Result Value Ref Range   Glucose-Capillary 95 70 - 99 mg/dL    Comment: Glucose reference range applies only to samples taken after fasting for at least 8 hours.    Blood Alcohol level:  Lab Results  Component Value Date   ETH <10 11/07/2020   ETH <10 10/25/2018    Metabolic Disorder Labs: Lab Results  Component Value Date   HGBA1C 5.2 11/10/2020   MPG 102.54 11/10/2020   MPG 99.67 08/20/2020   Lab Results  Component Value Date   PROLACTIN 98.3 (H) 10/29/2018   PROLACTIN 52.5 (H) 10/11/2018   Lab Results  Component Value Date   CHOL 99 11/11/2020   TRIG 69 11/11/2020   HDL 24 (L) 11/11/2020   CHOLHDL 4.1 11/11/2020   VLDL 14 11/11/2020   LDLCALC 61 11/11/2020   LDLCALC 44 08/20/2020    Physical Findings: AIMS: Facial and Oral Movements Muscles of Facial Expression: None, normal Lips and Perioral Area: None, normal Jaw: None, normal Tongue: None, normal,Extremity Movements Upper (arms, wrists, hands, fingers): None, normal Lower (legs, knees, ankles, toes): None, normal, Trunk Movements Neck, shoulders, hips: None, normal, Overall Severity Severity of abnormal movements (highest score from questions above): None, normal Incapacitation due to abnormal movements: None, normal Patient's awareness of abnormal movements (rate only patient's report): No Awareness, Dental Status Current problems with teeth and/or dentures?: No Does patient usually wear dentures?: No  CIWA:    COWS:     Musculoskeletal: Strength & Muscle Tone: within normal limits Gait & Station: normal Patient leans: N/A  Psychiatric Specialty Exam:  Presentation  General Appearance: Appropriate for Environment  Eye Contact:Good  Speech:Clear and  Coherent  Speech Volume:Normal  Handedness:Right   Mood and Affect  Mood:Euthymic  Affect:Congruent; Labile   Thought Process  Thought Processes:Coherent; Linear  Descriptions of Associations:Intact  Orientation:Full (Time, Place and Person)  Thought Content:Logical  History of Schizophrenia/Schizoaffective disorder:Yes  Duration of Psychotic Symptoms:Greater than six months  Hallucinations:Hallucinations: None Description of Auditory Hallucinations: None  Ideas of Reference:None  Suicidal Thoughts:Suicidal Thoughts: No  Homicidal Thoughts:Homicidal Thoughts: No   Sensorium  Memory:Immediate Good; Recent Good; Remote Good  Judgment:Poor  Insight:Poor   Executive Functions  Concentration:Fair  Attention Span:Fair  Recall:Fair  Fund of Knowledge:Fair  Language:Fair   Psychomotor Activity  Psychomotor Activity:Psychomotor Activity: Normal   Assets  Assets:Physical Health; Social Support   Sleep  Sleep:Sleep: Poor Number of Hours of Sleep: 3.75    Physical Exam Vitals reviewed.  Constitutional:      Appearance: Normal appearance.  HENT:     Head: Normocephalic and atraumatic.  Eyes:     Extraocular Movements: Extraocular movements intact.  Cardiovascular:     Rate and Rhythm: Regular rhythm. Tachycardia present.  Pulmonary:     Effort: Pulmonary effort is normal.  Musculoskeletal:        General: Normal range of motion.  Neurological:     Mental Status: She is alert.  Dermatologic: 3 red nodules on the medial L forearm, leg rash not assessed given current psychiatric condition.  Review of Systems  Respiratory:  Negative for shortness of breath.   Cardiovascular:  Negative for chest pain.  Neurological:  Negative for headaches.  Blood pressure 120/88, pulse (!) 109, temperature 98.2 F (36.8 C), temperature source Oral, resp. rate 16, height 5\' 5"  (1.651 m), weight 114 kg, last menstrual period 11/10/2020, SpO2 100 %. Body mass  index is 41.82 kg/m.   Assessment Samantha Ashley is a [age] YOF with a PPHx of schizoaffective d/o, bipolar type, as well as cannabis use disorder, presenting under IVC with concern for mania and a potential suicide attempt. Patient exhibits signs of mania (pressured speech, emotional lability, irritability, and hyper-religiosity), however, she also exhibits cluster B personality traits. Patient appears improved from previous day with Zyprexa, will continue.   Treatment Plan Summary: Daily contact with patient to assess and evaluate symptoms and progress in treatment and Medication management   Safety and Monitoring -- INVOLUNTARY admission to inpatient psychiatric unit for safety, stabilization and treatment -- Daily contact with patient to assess and evaluate symptoms and progress in treatment -- Patient's case to be discussed in multi-disciplinary team meeting -- Observation Level : q15 minute checks -- Vital signs:  q12 hours -- Precautions: suicide  Symptoms consistent with mania, Hx schizoaffective d/o bipolar type Patient restarted on home Lamictal 100 mg daily - Taper off given rash and lack of efficacy   -50 mg (7/19)  -Discontinued Zyprexa 5 mg bid (started 7/17) Increase to Zyprexa 5 mg AM / 15 mg QHS (7/19), given continued mood instability and sub-optimal sleep (5.75 hrs) Antipsychotic labs  EKG: NSR, Qtc 475 (7/14), recheck with Qtc 460 (7/17)  Lipids: HDL of 24, otherwise wnl  G9FA1C: 5.2 Weight gain may limit long term compliance with Zyprexa  Cannabis Use Disorder -Encourage abstinence -Motivational interviewing  Medical Management Covid neg CBC w WBC of 10.6, no other signs of infection CMP w Na of 132, no clinical signs of hyponatremia, repeat ordered - wnl (7/18) EtOH <10 UDS: benzos (pt has an Rx for Klonopin from 08/2020) and THC Upreg neg Pt on MF 500 mg bid as a home med (last A1C was 5.1, 07/2020) Pt on Loestrin OCP, continue  Continue PRNs: Tylenol,  Maalox, Milk of Magnesia, Trazodone  Carlyn ReichertNick Enos Muhl PGY-1, Psychiatry

## 2020-11-13 LAB — GLUCOSE, CAPILLARY: Glucose-Capillary: 83 mg/dL (ref 70–99)

## 2020-11-13 LAB — T4, FREE: Free T4: 0.87 ng/dL (ref 0.61–1.12)

## 2020-11-13 MED ORDER — OLANZAPINE 10 MG PO TABS
20.0000 mg | ORAL_TABLET | Freq: Every day | ORAL | Status: DC
  Filled 2020-11-13 (×2): qty 2

## 2020-11-13 MED ORDER — OLANZAPINE 20 MG PO TABS: 20.0000 mg | ORAL_TABLET | Freq: Every day | ORAL | 0 refills | Status: DC

## 2020-11-13 MED ORDER — METFORMIN HCL 500 MG PO TABS: 500.0000 mg | ORAL_TABLET | Freq: Two times a day (BID) | ORAL | 0 refills | Status: DC

## 2020-11-13 NOTE — Progress Notes (Signed)
Samantha Ashley stated she had a "great day."  When questioned about her behaviors early in the day, she stated "I can't dwell on that."  She appears superficial and smiled during the entire interaction.  She is up and visible on the unit, interacts well with peers and attends groups.  She denied SI/HI or AVH.  She accepted trazodone for sleep this evening as well as her scheduled medication.  She is currently resting with her eyes closed and appears to be asleep.  Q 15 minute checks maintained for safety.     11/12/20 2116  Psych Admission Type (Psych Patients Only)  Admission Status Involuntary  Psychosocial Assessment  Patient Complaints Insomnia  Eye Contact Fair  Facial Expression Fixed smile  Affect Appropriate to circumstance  Speech Logical/coherent  Interaction Assertive  Motor Activity Other (Comment) (Unremarkable)  Appearance/Hygiene Unremarkable  Behavior Characteristics Cooperative;Calm  Mood Anxious  Thought Process  Coherency WDL  Content WDL  Delusions None reported or observed  Perception WDL  Hallucination None reported or observed  Judgment Poor  Confusion None  Danger to Self  Current suicidal ideation? Denies  Danger to Others  Danger to Others None reported or observed

## 2020-11-13 NOTE — Progress Notes (Signed)
  [facility] Adult Case Management Discharge Plan :  Will you be returning to the same living situation after discharge:  Yes,  to home At discharge, do you have transportation home?: Yes,  fiance is to pick this pt up Do you have the ability to pay for your medications: Yes,  has insurance  Release of information consent forms completed and in the chart;  Patient's signature needed at discharge.  Patient to Follow up at:  Follow-up Information     [facility] [place] Follow up on 12/16/2020.   Specialty: Behavioral Health Why: You have an appointment for therapy services on  12/16/20 with Coolidge Breeze at 10:00 am.  This will be a Virtual appointment.  Please call provider on the details of this appointment. Contact information: [address] [place] [address] [phone]        [facility] Go on 11/26/2020.   Why: You have an appointment for medication management services on 11/26/20 at 2:10 pm.  This appointment will be held in person. Contact information: [address] [place] [address] [phone]                 Next level of care provider has access to [facility] Link:no  Safety Planning and Suicide Prevention discussed: Yes,  with fiance     Has patient been referred to the Quitline?: N/A patient is not a smoker  Patient has been referred for addiction treatment: N/A  Otelia Santee, LCSW 11/13/2020, 1:49 PM

## 2020-11-13 NOTE — [facility] Suicide Risk Assessment (Signed)
[facility] INPATIENT:  Family/Significant Other Suicide Prevention Education  Suicide Prevention Education:  Education Completed; fiance, Samantha Ashley [phone]), has been identified by the patient as the family member/significant other with whom the patient will be residing, and identified as the person(s) who will aid the patient in the event of a mental health crisis (suicidal ideations/suicide attempt).  With written consent from the patient, the family member/significant other has been provided the following suicide prevention education, prior to the and/or following the discharge of the patient.  CSW spoke with this pt's fiance who shared that she has no safety concerns with this pt discharging and states that this pt is able to return home.   The suicide prevention education provided includes the following: Suicide risk factors Suicide prevention and interventions National Suicide Hotline telephone number [facility] assessment telephone number [facility] Emergency Assistance 911 [facility] and/or Residential Mobile Crisis Unit telephone number  Request made of family/significant other to: Remove weapons (e.g., guns, rifles, knives), all items previously/currently identified as safety concern.   Remove drugs/medications (over-the-counter, prescriptions, illicit drugs), all items previously/currently identified as a safety concern.  The family member/significant other verbalizes understanding of the suicide prevention education information provided.  The family member/significant other agrees to remove the items of safety concern listed above.  Samantha Ashley A Samantha Ashley 11/13/2020, 10:12 AM

## 2020-11-13 NOTE — [facility] Suicide Risk Assessment (Signed)
[facility] Discharge Suicide Risk Assessment   Principal Problem: Schizoaffective disorder, bipolar type [facility]) Discharge Diagnoses: Principal Problem:   Schizoaffective disorder, bipolar type (HCC) Active Problems:   Bipolar 1 disorder (HCC)   Total Time spent with patient: 20 minutes  Musculoskeletal: Strength & Muscle Tone: within normal limits Gait & Station: normal Patient leans: N/A  Psychiatric Specialty Exam  Presentation  General Appearance: Appropriate for Environment  Eye Contact:Good  Speech:Clear and Coherent; Normal Rate  Speech Volume:Normal  Handedness:Right   Mood and Affect  Mood:Euthymic  Duration of Depression Symptoms: Greater than two weeks  Affect:Appropriate; Congruent   Thought Process  Thought Processes:Coherent; Goal Directed  Descriptions of Associations:Intact  Orientation:Full (Time, Place and Person)  Thought Content:Logical  History of Schizophrenia/Schizoaffective disorder:No  Duration of Psychotic Symptoms:N/A  Hallucinations:Hallucinations: None Ideas of Reference:None  Suicidal Thoughts:Suicidal Thoughts: No Homicidal Thoughts:Homicidal Thoughts: No  Sensorium  Memory:Immediate Good; Recent Good  Judgment:Fair  Insight:Good   Executive Functions  Concentration:Good  Attention Span:Good  Recall:Good  Fund of Knowledge:Good  Language:Good   Psychomotor Activity  Psychomotor Activity: Psychomotor Activity: Normal  Assets  Assets:Communication Skills; Desire for Improvement; Housing; Resilience; Social Support   Sleep  Sleep: Sleep: Good Number of Hours of Sleep: 6.75  Physical Exam: Physical Exam Vitals and nursing note reviewed.  Constitutional:      General: She is not in acute distress.    Appearance: Normal appearance. She is not diaphoretic.  HENT:     Head: Normocephalic and atraumatic.  Cardiovascular:     Pulses: Normal pulses.  Pulmonary:     Effort: Pulmonary effort is normal.   Neurological:     General: No focal deficit present.     Mental Status: She is alert and oriented to person, place, and time.   Review of Systems  Constitutional: Negative.   HENT: Negative.    Respiratory: Negative.    Cardiovascular: Negative.   Gastrointestinal: Negative.   Musculoskeletal: Negative.   Neurological: Negative.   Psychiatric/Behavioral:  Negative for depression, hallucinations and suicidal ideas. The patient does not have insomnia.   All other systems reviewed and are negative. Blood pressure 129/84, pulse 72, temperature 98.6 F (37 C), temperature source Oral, resp. rate 16, height 5\' 5"  (1.651 m), weight 114 kg, last menstrual period 11/10/2020, SpO2 100 %. Body mass index is 41.82 kg/m.  Mental Status Per Nursing Assessment::   On Admission:  NA  Demographic Factors:  Adolescent or young adult  Loss Factors: NA  Historical Factors: Impulsivity  Risk Reduction Factors:   Living with another person, especially a relative, Positive social support, Positive therapeutic relationship, Positive coping skills or problem solving skills, and Future oriented outlook  Continued Clinical Symptoms:  Bipolar Disorder - improved; nearing euthymia  Cognitive Features That Contribute To Risk:  None    Suicide Risk:  Minimal acute risk: No identifiable suicidal ideation.  Patients presenting with no risk factors but with morbid ruminations; may be classified as minimal risk based on the severity of the depressive symptoms   Follow-up Information     [facility] Follow up on 12/16/2020.   Specialty: Behavioral Health Why: You have an appointment for therapy services on  12/16/20 with 12/18/20 at 10:00 am.  This will be a Virtual appointment.  Please call provider on the details of this appointment. Contact information: [address] [place] [phone]        [facility]  Go on 11/26/2020.   Why: You have an appointment for medication management services on 11/26/20 at 2:10 pm.  This appointment will be held in person. Contact information: [address] [place] [address] [phone]                 Plan Of Care/Follow-up recommendations:  Activity:  as tolerated  Tests:   -You will periodically need to have blood drawn to check your cholesterol and blood sugar while taking olanzapine/Zyprexa.  Your outpatient doctor will let you know when lab work needs to be performed. -You had lab work to check your thyroid function during this hospitalization.  Your TSH was normal (1.367) and your free T4 was normal (0.87).  Other:   -Take medications as prescribed.   -Do not drink alcohol.  Do not use marijuana/cannabis or other drugs.   -Keep outpatient mental health follow-up appointments with therapist and psychiatrist.  -See your primary care provider for treatment of medical conditions.  Claudie Revering, MD 11/13/2020, 1:09 PM

## 2020-11-13 NOTE — Discharge Summary (Signed)
Physician Discharge Summary Note  Patient:  Samantha Ashley is an 25 y.o., female MRN:  [id number] DOB:  05-Feb-1996 Patient phone:  [phone] (home)  Patient address:   [address] [place] [address],  Total Time spent with patient: 30 minutes  Date of Admission:  11/09/2020 Date of Discharge: 11/13/2020  Reason for Admission:  (From MD's admission note):  Samantha Ashley is a 25 y/o female. Patient presented under IVC paperwork from [facility]. Samantha Ashley has psychiatric history of schizoaffective disorder, bipolar type with prior psychiatric inpatient admission for mania; she was last admitted to this facility in August 19, 2020. On evaluation Samantha Ashley is alert and oriented X4; she is pleasant and cooperative. She describes her mood as anxious and her affect is congruent with stated mood. Her speech is clear and coherent but rapid and pressured. Her thought process is coherent. There were no indications that she is responding to any internal or external stimuli and no evidence of delusional thought content noted during assessment.   She endorses passive suicidal thoughts of driving of the road, she later states this is "intrusive thoughts and I will never act on it." She admits to prior history of self-harming at age 89 and suicidal attempt X 1 by "overdosing on dad's medication at age 22." She states she was not hospitalized and her mother is unaware of suicide attempt. She denies homicidal ideation, auditory/visual hallucination, and paranoia. He admits to using less than 1 gram of marijuana daily, she reports that she drinks alcohol socially, 1-3 shots on Saturdays. She denies other substance use.      Patient acknowledged that she was in a "manic state" and was sleeping about 1-3 hours/night in the 3 days prior to hospitalization. She states "I can either be manic or be depress; I have Bipolar. I choose mania over depression any day." She reports that she became frustrated  because her family members were treating her like a "psychiatric patient" and will not allow her to leave the house. She reports that her fiance and mom hid her car key and will not allow her to drive. She then report that she went to the third floor in her apartment building, climbed a ladder to the roof to look at the air condition and her family became "scared" and contacted the police. She denied that this was a suicidal attempt and reports "I'm scared of heights, I don't even know why I went up there. I only went up there because I saw the ladder, if the ladder wasn't there I wouldn't have climbed up there."   Patient denied all allegations made in her IVC petition. She reports that her fiance was responsible for damaged properties in their home. She says that her fiance became upset and was punching the walls and throwing items at the walls because "I locked her in the house and sat in front of the door to keep her from running away from our issues like she always does." She reports that she is complaint with her medication and that she followed by Dr. Maggie Schwalbe for medication management.     Principal Problem: Schizoaffective disorder, bipolar type [facility]) Discharge Diagnoses: Principal Problem:   Schizoaffective disorder, bipolar type (HCC) Active Problems:   Bipolar 1 disorder (HCC)   Past Psychiatric History: See H&P  Past Medical History:  Past Medical History:  Diagnosis Date   Asthma    History reviewed. No pertinent surgical history. Family History:  Family History  Problem  Relation Age of Onset   Healthy Mother    Healthy Father    Family Psychiatric  History: See H&P Social History:  Social History   Substance and Sexual Activity  Alcohol Use Not Currently     Social History   Substance and Sexual Activity  Drug Use Yes   Types: Marijuana    Social History   Socioeconomic History   Marital status: Single    Spouse name: Not on file   Number of children: Not on file    Years of education: Not on file   Highest education level: Not on file  Occupational History   Not on file  Tobacco Use   Smoking status: Every Day    Types: Cigars   Smokeless tobacco: Never  Substance and Sexual Activity   Alcohol use: Not Currently   Drug use: Yes    Types: Marijuana   Sexual activity: Yes    Birth control/protection: Pill  Other Topics Concern   Not on file  Social History Narrative   Not on file   Social Determinants of Health   Financial Resource Strain: Not on file  Food Insecurity: Not on file  Transportation Needs: Not on file  Physical Activity: Not on file  Stress: Not on file  Social Connections: Not on file    Hospital Course:  After the above admission evaluation, Samantha Ashley's presenting symptoms were noted. She was recommended for mood stabilization treatments. The medication regimen targeting those presenting symptoms were discussed with her & initiated with her consent. She was started on Zyprexa for bipolar disorder. She is  also taking metformin for weight gain associated with antipsychotics use. Her UDS on arrival to the ED was positive for benzodiazepines and THC. Her BAL was negative. She was however medicated, stabilized & discharged on the medications as listed on her discharge medication list below. Besides the mood stabilization treatments, Samantha Ashley was also enrolled & participated in the group counseling sessions being offered & held on this unit. She learned coping skills. She presented no other significant pre-existing medical issues that required treatment. She tolerated his treatment regimen without any adverse effects or reactions reported.   During the course of her hospitalization, the 15-minute checks were adequate to ensure patient's safety. Samantha Ashley did not display any dangerous, violent or suicidal behavior on the unit. She interacted with patients & staff appropriately, participated appropriately in the group sessions/therapies.  Her medications were addressed & adjusted to meet her needs. She was recommended for outpatient follow-up care & medication management upon discharge to assure continuity of care & mood stability.  At the time of discharge patient is not reporting any acute suicidal/homicidal ideations. She feels more confident about her self-care & in managing his mental health. She currently denies any new issues or concerns. Education and supportive counseling provided throughout her hospital stay & upon discharge.   Today upon her discharge evaluation with the attending psychiatrist, Samantha Ashley shares she is doing well. She denies any other specific concerns. She is sleeping well. Her appetite is good. She denies other physical complaints. She denies AH/VH, delusional thoughts or paranoia. She does not appear to be responding to any internal stimuli. She feels that her medications have been helpful & is in agreement to continue her current treatment regimen as recommended. She was able to engage in safety planning including plan to return to [facility] or contact emergency services if she feels unable to maintain her own safety or the safety of others. Pt had no  further questions, comments, or concerns. She left [facility] with all personal belongings in no apparent distress. Transportation home per private vehicle.    Physical Findings: AIMS: Facial and Oral Movements Muscles of Facial Expression: None, normal Lips and Perioral Area: None, normal Jaw: None, normal Tongue: None, normal,Extremity Movements Upper (arms, wrists, hands, fingers): None, normal Lower (legs, knees, ankles, toes): None, normal, Trunk Movements Neck, shoulders, hips: None, normal, Overall Severity Severity of abnormal movements (highest score from questions above): None, normal Incapacitation due to abnormal movements: None, normal Patient's awareness of abnormal movements (rate only patient's report): No Awareness, Dental Status Current problems with  teeth and/or dentures?: No Does patient usually wear dentures?: No  CIWA:    COWS:     Musculoskeletal: Strength & Muscle Tone: within normal limits Gait & Station: normal Patient leans: N/A   Psychiatric Specialty Exam:  Presentation  General Appearance: Appropriate for Environment  Eye Contact:Good  Speech:Clear and Coherent; Normal Rate  Speech Volume:Normal  Handedness:Right   Mood and Affect  Mood:Euthymic  Affect:Appropriate; Congruent   Thought Process  Thought Processes:Coherent; Goal Directed  Descriptions of Associations:Intact  Orientation:Full (Time, Place and Person)  Thought Content:Logical  History of Schizophrenia/Schizoaffective disorder:No  Duration of Psychotic Symptoms:N/A  Hallucinations:Hallucinations: None Ideas of Reference:None  Suicidal Thoughts:Suicidal Thoughts: No Homicidal Thoughts:Homicidal Thoughts: No  Sensorium  Memory:Immediate Good; Recent Good  Judgment:Fair  Insight:Good   Executive Functions  Concentration:Good  Attention Span:Good  Recall:Good  Fund of Knowledge:Good  Language:Good   Psychomotor Activity  Psychomotor Activity: Psychomotor Activity: Normal  Assets  Assets:Communication Skills; Desire for Improvement; Housing; Resilience; Social Support   Sleep  Sleep: Sleep: Good Number of Hours of Sleep: 6.75   Physical Exam: Physical Exam Vitals and nursing note reviewed.  Constitutional:      Appearance: Normal appearance.  HENT:     Head: Normocephalic.  Pulmonary:     Effort: Pulmonary effort is normal.  Musculoskeletal:        General: Normal range of motion.     Cervical back: Normal range of motion.  Neurological:     General: No focal deficit present.     Mental Status: She is alert and oriented to person, place, and time.  Psychiatric:        Attention and Perception: Attention and perception normal. She does not perceive auditory or visual hallucinations.        Mood  and Affect: Mood normal.        Speech: Speech normal.        Behavior: Behavior normal. Behavior is cooperative.        Thought Content: Thought content is not paranoid or delusional. Thought content does not include homicidal or suicidal ideation. Thought content does not include homicidal or suicidal plan.        Cognition and Memory: Cognition normal.   Review of Systems  Constitutional:  Negative for fever.  HENT:  Negative for congestion, sinus pain and sore throat.   Respiratory:  Negative for cough and shortness of breath.   Cardiovascular:  Negative for chest pain.  Gastrointestinal: Negative.   Genitourinary: Negative.   Musculoskeletal: Negative.   Neurological: Negative.   Blood pressure 129/84, pulse 72, temperature 98.6 F (37 C), temperature source Oral, resp. rate 16, height 5\' 5"  (1.651 m), weight 114 kg, last menstrual period 11/10/2020, SpO2 100 %. Body mass index is 41.82 kg/m.   Social History   Tobacco Use  Smoking Status Every Day   Types: Cigars  Smokeless Tobacco Never   Tobacco Cessation:  N/A, patient does not currently use tobacco products   Blood Alcohol level:  Lab Results  Component Value Date   ETH <10 11/07/2020   ETH <10 10/25/2018    Metabolic Disorder Labs:  Lab Results  Component Value Date   HGBA1C 5.2 11/10/2020   MPG 102.54 11/10/2020   MPG 99.67 08/20/2020   Lab Results  Component Value Date   PROLACTIN 98.3 (H) 10/29/2018   PROLACTIN 52.5 (H) 10/11/2018   Lab Results  Component Value Date   CHOL 99 11/11/2020   TRIG 69 11/11/2020   HDL 24 (L) 11/11/2020   CHOLHDL 4.1 11/11/2020   VLDL 14 11/11/2020   LDLCALC 61 11/11/2020   LDLCALC 44 08/20/2020    See Psychiatric Specialty Exam and Suicide Risk Assessment completed by Attending Physician prior to discharge.  Discharge destination:  Home  Is patient on multiple antipsychotic therapies at discharge:  No   Has Patient had three or more failed trials of  antipsychotic monotherapy by history:  Yes,   Antipsychotic medications that previously failed include:   1.  Lamictal. and 2.  Haldol. No  Recommended Plan for Multiple Antipsychotic Therapies: NA  Discharge Instructions     Diet - low sodium heart healthy   Complete by: As directed    Increase activity slowly   Complete by: As directed       Allergies as of 11/13/2020       Reactions   Coconut Oil Itching        Medication List     STOP taking these medications    acetaminophen 500 MG tablet Commonly known as: TYLENOL   aspirin 325 MG EC tablet   aspirin-acetaminophen-caffeine 250-250-65 MG tablet Commonly known as: EXCEDRIN MIGRAINE   benztropine 1 MG tablet Commonly known as: COGENTIN   diphenhydrAMINE 25 mg capsule Commonly known as: BENADRYL   divalproex 500 MG 24 hr tablet Commonly known as: DEPAKOTE ER   haloperidol 10 MG tablet Commonly known as: HALDOL   haloperidol 5 MG tablet Commonly known as: HALDOL   lamoTRIgine 100 MG tablet Commonly known as: LAMICTAL   traZODone 100 MG tablet Commonly known as: DESYREL   traZODone 50 MG tablet Commonly known as: DESYREL       TAKE these medications      Indication  albuterol 108 (90 Base) MCG/ACT inhaler Commonly known as: VENTOLIN HFA Inhale 2 puffs into the lungs every 6 (six) hours as needed for wheezing or shortness of breath.  Indication: Asthma   fluticasone 50 MCG/ACT nasal spray Commonly known as: FLONASE Place 1 spray into both nostrils daily. What changed:  how much to take when to take this reasons to take this  Indication: Nonallergic Rhinitis   Junel FE 1.5/30 1.5-30 MG-MCG tablet Generic drug: norethindrone-ethinyl estradiol-iron Take 1 tablet by mouth at bedtime.  Indication: Birth Control Treatment   metFORMIN 500 MG tablet Commonly known as: GLUCOPHAGE Take 1 tablet (500 mg total) by mouth 2 (two) times daily.  Indication: Antipsychotic Therapy-Induced Weight  Gain   OLANZapine 20 MG tablet Commonly known as: ZYPREXA Take 1 tablet (20 mg total) by mouth at bedtime.  Indication: Bipolar Disorder        Follow-up Information     [facility] Follow up on 12/16/2020.   Specialty: Behavioral Health Why: You have an appointment for therapy services on  12/16/20 with Coolidge Breeze at 10:00 am.  This will  be a Virtual appointment.  Please call provider on the details of this appointment. Contact information: [address] [place] [id number] [phone]        [facility] Go on 11/26/2020.   Why: You have an appointment for medication management services on 11/26/20 at 2:10 pm.  This appointment will be held in person. Contact information: [address] [place] [id number] [phone]                 Follow-up recommendations:  Activity:  as tolerated Diet:  Heart healthy  Comments:  Prescriptions were given at discharge.  Patient is agreeable with the discharge plan.  She was given an opportunity to ask questions.  She appears to feel comfortable with discharge and denies any current suicidal or homicidal thoughts.   Patient is instructed prior to discharge to: Take all medications as prescribed by her mental healthcare provider. Report any adverse effects and or reactions from the medicines to her outpatient provider promptly. Patient has been instructed & cautioned: To not engage in alcohol and or illegal drug use while on prescription medicines. In the event of worsening symptoms, patient is instructed to call the crisis hotline, 911 and or go to the nearest ED for appropriate evaluation and treatment of symptoms. To follow-up with her primary care provider for your other medical issues, concerns and or health care needs.   Signed: Laveda AbbeLaurie Britton Kamani Lewter, NP 11/13/2020, 1:29 PM

## 2020-11-13 NOTE — Progress Notes (Signed)
Recreation Therapy Notes  Date: 7.20.22 Time: 0930 Location: [address] Dayroom  Group Topic: Stress Management   Goal Area(s) Addresses:  Patient will actively participate in stress management techniques presented during session.  Patient will successfully identify benefit of practicing stress management post d/c.   Behavioral Response: Appropriate  Intervention: Guided exercise with ambient sound and script  Activity :Guided Imagery  LRT provided an introduction on practice of visualization via guided imagery. Patient was asked to participate in the technique introduced during session. Patients were given suggestions of ways to access scripts post d/c and encouraged to explore Youtube and other apps available on smartphones, tablets, and computers.   Education:  Stress Management, Discharge Planning.   Education Outcome: Acknowledges education  Clinical Observations/Feedback: Patient actively engaged in technique introduced.  Pt was inquisitive about how a person could relax if they can't imagine a place.  LRT explained that it was okay if an individual found it hard imagine a place as long as they were ultimately able to clear their mind, relax and block out any distractions.  Pt went on to say she has no problem imaging things because what ever is put out for the group to imagine, pt is able to take her mind directly to that place.        Caroll Rancher,  LRT/CTRS         Caroll Rancher A 11/13/2020 11:56 AM

## 2020-11-13 NOTE — Progress Notes (Signed)
D: Pt A & O X 3. Denies SI, HI, AVH and pain at this time. Presents with logical speech, fair eye contact, animated but anxious on interactions "I'm just ready to go home". Pt D/C home as ordered. Picked up in lobby by my "fiancee". A: D/C instructions reviewed with pt including prescriptions and follow up appointment; compliance encouraged. All belongings from locker 27 given to pt at time of departure. Scheduled medications given with verbal education and effects monitored. Safety checks maintained without incident till time of d/c.  R: Pt receptive to care. Compliant with medications when offered. Denies adverse drug reactions when assessed. Verbalized understanding related to d/c instructions. Signed belonging sheet in agreement with items received from locker. Ambulatory with a steady gait. Appears to be in no physical distress at time of departure.

## 2020-11-13 NOTE — [facility] Group Notes (Signed)
Spiritual care group on grief and loss facilitated by chaplain Dyanne Carrel, [facility]   Group Goal:   Support / Education around grief and loss   Members engage in facilitated group support and psycho-social education.   Group Description:   Following introductions and group rules, group members engaged in facilitated group dialog and support around topic of loss, with particular support around experiences of loss in their lives. Group Identified types of loss (relationships / self / things) and identified patterns, circumstances, and changes that precipitate losses. Reflected on thoughts / feelings around loss, normalized grief responses, and recognized variety in grief experience. Group noted Worden's four tasks of grief in discussion.   Group drew on Adlerian / Rogerian, narrative, MI,   Patient Progress: Samantha Ashley attended group and participated in conversation.  At times she needed redirection as she sometimes got off topic.  Chaplain Dyanne Carrel, Bcc Pager, [phone] 5:11 PM

## 2020-12-16 ENCOUNTER — Ambulatory Visit (INDEPENDENT_AMBULATORY_CARE_PROVIDER_SITE_OTHER): Payer: 59 | Admitting: Licensed Clinical Social Worker

## 2020-12-16 DIAGNOSIS — F411 Generalized anxiety disorder: Secondary | ICD-10-CM | POA: Diagnosis not present

## 2020-12-16 DIAGNOSIS — F439 Reaction to severe stress, unspecified: Secondary | ICD-10-CM

## 2020-12-16 DIAGNOSIS — F319 Bipolar disorder, unspecified: Secondary | ICD-10-CM | POA: Diagnosis not present
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
## 2021-02-17 ENCOUNTER — Ambulatory Visit (HOSPITAL_COMMUNITY): Payer: 59 | Admitting: Licensed Clinical Social Worker

## 2021-02-17 NOTE — Progress Notes (Signed)
Therapist text patient for a session and she did not respond. Session is a now show.

## 2021-03-25 ENCOUNTER — Ambulatory Visit (INDEPENDENT_AMBULATORY_CARE_PROVIDER_SITE_OTHER): Payer: 59 | Admitting: Licensed Clinical Social Worker

## 2021-03-25 ENCOUNTER — Encounter (HOSPITAL_COMMUNITY): Payer: Self-pay

## 2021-03-25 DIAGNOSIS — F439 Reaction to severe stress, unspecified: Secondary | ICD-10-CM | POA: Diagnosis not present

## 2021-03-25 DIAGNOSIS — F319 Bipolar disorder, unspecified: Secondary | ICD-10-CM | POA: Diagnosis not present

## 2021-03-25 DIAGNOSIS — F411 Generalized anxiety disorder: Secondary | ICD-10-CM | POA: Diagnosis not present

## 2021-03-25 NOTE — Progress Notes (Signed)
Virtual Visit via Video Note  I connected with Samantha Ashley on 03/25/21 at 10:00 AM EST by a video enabled telemedicine application and verified that I am speaking with the correct person using two identifiers.  Location: Patient: home Provider: office   I discussed the limitations of evaluation and management by telemedicine and the availability of in person appointments. The patient expressed understanding and agreed to proceed    I discussed the assessment and treatment plan with the patient. The patient was provided an opportunity to ask questions and all were answered. The patient agreed with the plan and demonstrated an understanding of the instructions.   The patient was advised to call back or seek an in-person evaluation if the symptoms worsen or if the condition fails to improve as anticipated.  I provided 40 minutes of non-face-to-face time during this encounter.  THERAPIST PROGRESS NOTE  Session Time: 10:00 AM to 10:40 AM  Participation Level: Active  Behavioral Response: CasualAlertappropriate relates situational depression  Type of Therapy: Individual Therapy  Treatment Goals addressed:  Bipolar as treatment goal, maintain stability in mood, coping Interventions: Solution Focused, Biofeedback, Supportive, and Other: coping  Summary: Samantha Ashley is a 25 y.o. female who presents with things ok on medicine Zyprexa best medication because doesn't feel like on medication. Circumstantial issues added to the depression. Has to find a new place to live face with eviction start a new job Dec 16 so no income. Lives with wife got behind because out of work with short-term disability for four months.  Noted improvement with depression as its only circumstantial, they are looking for places and taking steps with the process at this point perspectives haven't been approved yet. Appeal the eviction so waiting on a date for hearing.  Therapist notes this gives him more time to  find something and patient agrees wife works at part-time hub by her income could not pay for rent.  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  She will be working with [employer] troubleshooting and therapist noted may be better than having to deal with complaints all the time. Depression isn't significant circumstantial, mood in itself not down. Married in October. Weekend celebration small reception with family. Family wanted to send to [place] but wife couldn't get off work. Family put aside their convictions to have a relationship.  Therapist noted positive this development.  Spent rest of session reviewing assessment, treatment plan talking about direction of therapy  Therapist reviewed symptoms any significant changes in mood and functioning as patient was last seen in August.  Noted improvement in depression at this point situational and therapist noted this is a positive for patient.  Noted patient also engaging in problem solving for stress management very helpful for stress and mood and provided positive feedback to reinforce good coping.  Noted change of job can be helpful for patient with mood recent marriage and supportive family as another positive for patient as supports are important.  In session needed to complete treatment plan patient gave consent to complete virtually, needed to complete assessment, including ASAM scale, patient does not see marijuana use is interfering with symptoms we will continue to explore.  Completed pain assessment and nutrition assessment.  Noted therapy is good to help patient stay on course help processing feelings help with coping, focus on maintaining stability with bipolar.  Therapist note  introducing therapeutic interventions for patient to see if helpful that not 1 size fits all so she learns different ones apply ones that  are helpful.  Therapist provided active listening open questions, supportive intervention.  Provided brief education on bipolar and managing mood symptoms patient seems very aware with therapy as at tool helping her maintain stability Suicidal/Homicidal: No  Plan: Return again in 5 weeks.2.  Check in with patient, processed feelings and work on stress management, introduce treatment strategies that could be helpful for mood regulation including DBT mindfulness, as patient is stable with mood but therapist can educate her on trauma how stored and how it is addressed clinically in therapy  Diagnosis: Axis I:  bipolar 1 disorder, depressed, generalized anxiety disorder, trauma and stressor disorder    Axis II: No diagnosis    Coolidge Breeze, LCSW 03/25/2021

## 2021-03-25 NOTE — Plan of Care (Signed)
Patient participated in development of treatment plan 

## 2021-05-05 ENCOUNTER — Ambulatory Visit (INDEPENDENT_AMBULATORY_CARE_PROVIDER_SITE_OTHER): Payer: Self-pay | Admitting: Licensed Clinical Social Worker

## 2021-05-05 ENCOUNTER — Encounter (HOSPITAL_COMMUNITY): Payer: Self-pay

## 2021-05-05 DIAGNOSIS — F439 Reaction to severe stress, unspecified: Secondary | ICD-10-CM

## 2021-05-05 DIAGNOSIS — F411 Generalized anxiety disorder: Secondary | ICD-10-CM

## 2021-05-05 DIAGNOSIS — F319 Bipolar disorder, unspecified: Secondary | ICD-10-CM

## 2021-05-05 NOTE — Progress Notes (Signed)
Therapist contacted patient through text for session and she did not respond. Session is a no show

## 2021-05-29 ENCOUNTER — Other Ambulatory Visit: Payer: Self-pay

## 2021-05-29 ENCOUNTER — Encounter (HOSPITAL_COMMUNITY): Payer: Self-pay

## 2021-05-29 ENCOUNTER — Ambulatory Visit (HOSPITAL_COMMUNITY)
Admission: EM | Admit: 2021-05-29 | Discharge: 2021-05-29 | Disposition: A | Discharge status: Home / Self Care | Payer: 59 | Attending: Physician Assistant | Admitting: Physician Assistant

## 2021-05-29 DIAGNOSIS — J069 Acute upper respiratory infection, unspecified: Secondary | ICD-10-CM

## 2021-05-29 DIAGNOSIS — R051 Acute cough: Secondary | ICD-10-CM

## 2021-05-29 LAB — POC INFLUENZA A AND B ANTIGEN (URGENT CARE ONLY)
INFLUENZA A ANTIGEN, POC: NEGATIVE
INFLUENZA B ANTIGEN, POC: NEGATIVE

## 2021-05-29 MED ORDER — BENZONATATE 100 MG PO CAPS: 100.0000 mg | ORAL_CAPSULE | Freq: Three times a day (TID) | ORAL | 0 refills | Status: DC

## 2021-05-29 NOTE — ED Triage Notes (Signed)
Pt c/o cough, congestion, body aches, and diarrhea since yesterday. Denies taking any meds.

## 2021-05-29 NOTE — ED Provider Notes (Signed)
[facility]    CSN: [id number] Arrival date & time: 05/29/21  1701      History   Chief Complaint Chief Complaint  Patient presents with   Cough    HPI Samantha Ashley is a 26 y.o. female.   Patient presents today with a 24-hour history of URI symptoms.  Reports cough, congestion, headache, body aches, diarrhea, fatigue, malaise.  Denies any chest pain, shortness of breath, vomiting, abdominal pain.  She has not tried any over-the-counter medication for symptom management.  She denies any recent antibiotic use.  She has had COVID approximately 1 month ago (December 2022).  Reports the symptoms resolved and current symptoms are similar to previous episodes of this condition.  She has a COVID-19 vaccine but has not had flu shot.  She does have a history of asthma but has not required albuterol inhaler.  She is having difficulty with heel activities as result of symptoms.  Reports numerous sick contacts at her place of employment.   Past Medical History:  Diagnosis Date   Asthma     Patient Active Problem List   Diagnosis Date Noted   Bipolar 1 disorder (HCC) 11/09/2020   Marijuana abuse 08/23/2020   Bipolar 1 disorder, depressed (HCC) 10/28/2018   Bipolar depression (HCC) 10/15/2018   Schizoaffective disorder, bipolar type (HCC) 10/10/2018   Schizophrenia (HCC) 10/10/2018    History reviewed. No pertinent surgical history.  OB History   No obstetric history on file.      Home Medications    Prior to Admission medications   Medication Sig Start Date End Date Taking? Authorizing Provider  benzonatate (TESSALON) 100 MG capsule Take 1 capsule (100 mg total) by mouth every 8 (eight) hours. 05/29/21  Yes Jaclene Bartelt K, PA-C  albuterol (VENTOLIN HFA) 108 (90 Base) MCG/ACT inhaler Inhale 2 puffs into the lungs every 6 (six) hours as needed for wheezing or shortness of breath.    [provider]  fluticasone (FLONASE) 50 MCG/ACT nasal spray Place 1 spray  into both nostrils daily. 08/27/20   Laveda Abbe, NP  JUNEL FE 1.5/30 1.5-30 MG-MCG tablet Take 1 tablet by mouth at bedtime.    [provider]  metFORMIN (GLUCOPHAGE) 500 MG tablet Take 1 tablet (500 mg total) by mouth 2 (two) times daily. 11/13/20   Laveda Abbe, NP  OLANZapine (ZYPREXA) 20 MG tablet Take 1 tablet (20 mg total) by mouth at bedtime. 11/13/20   Laveda Abbe, NP    Family History Family History  Problem Relation Age of Onset   Healthy Mother    Healthy Father     Social History Social History   Tobacco Use   Smoking status: Every Day    Types: Cigars   Smokeless tobacco: Never  Substance Use Topics   Alcohol use: Not Currently   Drug use: Yes    Types: Marijuana     Allergies   Coconut oil   Review of Systems Review of Systems  Constitutional:  Positive for activity change, appetite change and fatigue. Negative for fever.  HENT:  Positive for congestion and sore throat. Negative for sinus pressure and sneezing.   Respiratory:  Positive for cough. Negative for shortness of breath.   Cardiovascular:  Negative for chest pain.  Gastrointestinal:  Positive for diarrhea. Negative for abdominal pain, nausea and vomiting.  Musculoskeletal:  Positive for arthralgias and myalgias.  Neurological:  Positive for headaches. Negative for dizziness and light-headedness.    Physical Exam  Triage Vital Signs ED Triage Vitals [05/29/21 1743]  Enc Vitals Group     BP 120/82     Pulse Rate 82     Resp 18     Temp 98.6 F (37 C)     Temp Source Oral     SpO2 100 %     Weight      Height      Head Circumference      Peak Flow      Pain Score 7     Pain Loc      Pain Edu?      Excl. in GC?    No data found.  Updated Vital Signs BP 120/82 (BP Location: Left Arm)    Pulse 82    Temp 98.6 F (37 C) (Oral)    Resp 18    LMP 05/07/2021    SpO2 100%   Visual Acuity Right Eye Distance:   Left Eye Distance:   Bilateral Distance:     Right Eye Near:   Left Eye Near:    Bilateral Near:     Physical Exam Vitals reviewed.  Constitutional:      General: She is awake. She is not in acute distress.    Appearance: Normal appearance. She is well-developed. She is not ill-appearing.     Comments: Very pleasant female appears stated age in no acute distress sitting comfortably in exam room  HENT:     Head: Normocephalic and atraumatic.     Right Ear: Tympanic membrane, ear canal and external ear normal. Tympanic membrane is not erythematous or bulging.     Left Ear: Tympanic membrane, ear canal and external ear normal. Tympanic membrane is not erythematous or bulging.     Nose:     Right Sinus: No maxillary sinus tenderness or frontal sinus tenderness.     Left Sinus: No maxillary sinus tenderness or frontal sinus tenderness.     Mouth/Throat:     Pharynx: Uvula midline. Posterior oropharyngeal erythema present. No oropharyngeal exudate.  Cardiovascular:     Rate and Rhythm: Normal rate and regular rhythm.     Heart sounds: Normal heart sounds, S1 normal and S2 normal. No murmur heard. Pulmonary:     Effort: Pulmonary effort is normal.     Breath sounds: Normal breath sounds. No wheezing, rhonchi or rales.     Comments: Clear to auscultation bilaterally Psychiatric:        Behavior: Behavior is cooperative.     UC Treatments / Results  Labs (all labs ordered are listed, but only abnormal results are displayed) Labs Reviewed  POC INFLUENZA A AND B ANTIGEN (URGENT CARE ONLY)    EKG   Radiology No results found.  Procedures Procedures (including critical care time)  Medications Ordered in UC Medications - No data to display  Initial Impression / Assessment and Plan / UC Course  I have reviewed the triage vital signs and the nursing notes.  Pertinent labs & imaging results that were available during my care of the patient were reviewed by me and considered in my medical decision making (see chart for  details).     Vital signs and physical exam reassuring today; no indication for emergent evaluation or imaging.  Patient is well-appearing, nontoxic, afebrile, nontachycardic.  Flu testing was negative.  No evidence of acute infection that would warrant initiation of antibiotics.  No indication for COVID testing given she has recovered from COVID within the past 30 days.  Recommended she  use over-the-counter medication including Tylenol ibuprofen for pain as well as Mucinex/Flonase for congestion.  She was prescribed Tessalon for cough.  Recommended she rest and drink plenty of fluid.  Discussed that if symptoms do not prove by next week she should follow-up here see her PCP.  Discussed alarm symptoms that warrant emergent evaluation including chest pain, shortness of breath, high fever, nausea/vomiting.  Strict return precautions given to which she expressed understanding.  Work excuse note provided.  Final Clinical Impressions(s) / UC Diagnoses   Final diagnoses:  Upper respiratory tract infection, unspecified type  Acute cough     Discharge Instructions      Your flu testing was negative.  There is no indication to test for COVID since you have recovered from this within the past 90 days.  I believe that you have a different virus.  Please alternate Tylenol ibuprofen for fever and pain.  Use Mucinex and Flonase for cough and congestion.  I have prescribed Tessalon for cough.  Make sure you rest and drink plenty of fluid.  If you have any worsening symptoms including chest pain, shortness of breath, nausea/vomiting interfering with oral intake, fever not responding to medication, weakness you need to be seen immediately.  If symptoms do not improve by next week please return here or see your PCP for reevaluation.     ED Prescriptions     Medication Sig Dispense Auth. Provider   benzonatate (TESSALON) 100 MG capsule Take 1 capsule (100 mg total) by mouth every 8 (eight) hours. 21 capsule  Cordelro Gautreau K, PA-C      PDMP not reviewed this encounter.   Jeani HawkingRaspet, Deserea Bordley K, PA-C 05/29/21 1949

## 2021-05-29 NOTE — Discharge Instructions (Signed)
Your flu testing was negative.  There is no indication to test for COVID since you have recovered from this within the past 90 days.  I believe that you have a different virus.  Please alternate Tylenol ibuprofen for fever and pain.  Use Mucinex and Flonase for cough and congestion.  I have prescribed Tessalon for cough.  Make sure you rest and drink plenty of fluid.  If you have any worsening symptoms including chest pain, shortness of breath, nausea/vomiting interfering with oral intake, fever not responding to medication, weakness you need to be seen immediately.  If symptoms do not improve by next week please return here or see your PCP for reevaluation.

## 2021-06-26 ENCOUNTER — Other Ambulatory Visit: Payer: Self-pay

## 2021-06-26 ENCOUNTER — Ambulatory Visit (HOSPITAL_COMMUNITY)
Admission: RE | Admit: 2021-06-26 | Discharge: 2021-06-26 | Disposition: A | Discharge status: Home / Self Care | Payer: 59 | Source: Ambulatory Visit | Attending: Family Medicine | Admitting: Family Medicine

## 2021-06-26 ENCOUNTER — Encounter (HOSPITAL_COMMUNITY): Payer: Self-pay

## 2021-06-26 VITALS — BP 115/78 | HR 118 | Temp 98.5°F | Resp 18

## 2021-06-26 DIAGNOSIS — A084 Viral intestinal infection, unspecified: Secondary | ICD-10-CM | POA: Diagnosis not present

## 2021-06-26 LAB — POCT URINALYSIS DIPSTICK, ED / UC
Bilirubin Urine: NEGATIVE
Glucose, UA: NEGATIVE mg/dL
Hgb urine dipstick: NEGATIVE
Ketones, ur: NEGATIVE mg/dL
Leukocytes,Ua: NEGATIVE
Nitrite: NEGATIVE
Protein, ur: NEGATIVE mg/dL
Specific Gravity, Urine: 1.015 (ref 1.005–1.030)
Urobilinogen, UA: 0.2 mg/dL (ref 0.0–1.0)
pH: 8.5 — ABNORMAL HIGH (ref 5.0–8.0)

## 2021-06-26 LAB — POC URINE PREG, ED: Preg Test, Ur: NEGATIVE

## 2021-06-26 MED ORDER — ONDANSETRON 4 MG PO TBDP: 4.0000 mg | ORAL_TABLET | Freq: Three times a day (TID) | ORAL | 0 refills | Status: DC | PRN

## 2021-06-26 NOTE — Discharge Instructions (Signed)
Please do your best to ensure adequate fluid intake in order to avoid dehydration. If you find that you are unable to tolerate drinking fluids regularly please proceed to the Emergency Department for evaluation. ° ° °

## 2021-06-26 NOTE — ED Triage Notes (Signed)
Pt c/o nausea and diarrhea with body aches since last weekend. States able to keep fluids down. Denies taking anything for her sx's/. ?

## 2021-07-01 NOTE — ED Provider Notes (Signed)
?[facility] ? ? ?[id number] ?06/26/21 Arrival Time: [id number] ? ?ASSESSMENT & PLAN: ? ?1. Viral gastroenteritis   ? ?No signs of dehydration requiring IVF. ?As needed: ?Meds ordered this encounter  ?Medications  ? ondansetron (ZOFRAN-ODT) 4 MG disintegrating tablet  ?  Sig: Take 1 tablet (4 mg total) by mouth every 8 (eight) hours as needed for nausea or vomiting.  ?  Dispense:  15 tablet  ?  Refill:  0  ? ?Labs Reviewed  ?POCT URINALYSIS DIPSTICK, ED / UC - Abnormal; Notable for the following components:  ?    Result Value  ? pH 8.5 (*)   ? All other components within normal limits  ?POC URINE PREG, ED  ?UPT negative. ? ?Discussed typical duration of symptoms for suspected viral GI illness. ?Will do her best to ensure adequate fluid intake in order to avoid dehydration. ?Will proceed to the Emergency Department for evaluation if unable to tolerate PO fluids regularly. ? ?Otherwise she will f/u with her PCP or here if not showing improvement over the next 48-72 hours. ? ?Reviewed expectations re: course of current medical issues. Questions answered. ?Outlined signs and symptoms indicating need for more acute intervention. ?Patient verbalized understanding. ?After Visit Summary given. ? ? ?SUBJECTIVE: ?History from: patient. ? ?Samantha Ashley is a 26 y.o. female who presents with complaint of non-bilious, non-bloody intermittent n/v with non-bloody diarrhea. Onset  within the past 48 hours . Abdominal discomfort: mild and cramping. Symptoms are gradually improving since beginning. Aggravating factors: eating. Alleviating factors: none identified. Associated symptoms: fatigue and body aches. She denies fever. Appetite: decreased. PO intake: decreased. Ambulatory without assistance. Urinary symptoms: none. ?Sick contacts: none. ?Recent travel or camping: none. ?OTC treatment: none. ? ?Patient's last menstrual period was 06/07/2021. ? ?History reviewed. No pertinent surgical history. ? ? ?OBJECTIVE: ? ?Vitals:  ?  06/26/21 1625  ?BP: 115/78  ?Pulse: (!) 118  ?Resp: 18  ?Temp: 98.5 ?F (36.9 ?C)  ?TempSrc: Oral  ?SpO2: 96%  ?  ?Tachycardia noted. ? ?General appearance: alert; no distress ?Oropharynx: slightly dry ?Lungs: clear to auscultation bilaterally; unlabored ?Heart: regular rate and rhythm ?Abdomen: soft; non-distended; no significant abdominal tenderness; reports "cramping" feeling; bowel sounds present; no masses or organomegaly; no guarding or rebound tenderness ?Back: no CVA tenderness ?Extremities: no edema; symmetrical with no gross deformities ?Skin: warm; dry ?Neurologic: normal gait ?Psychological: alert and cooperative; normal mood and affect ? ?Labs: ?Results for orders placed or performed during the hospital encounter of 06/26/21  ?POC urine pregnancy  ?Result Value Ref Range  ? Preg Test, Ur NEGATIVE NEGATIVE  ?POC Urinalysis dipstick  ?Result Value Ref Range  ? Glucose, UA NEGATIVE NEGATIVE mg/dL  ? Bilirubin Urine NEGATIVE NEGATIVE  ? Ketones, ur NEGATIVE NEGATIVE mg/dL  ? Specific Gravity, Urine 1.015 1.005 - 1.030  ? Hgb urine dipstick NEGATIVE NEGATIVE  ? pH 8.5 (H) 5.0 - 8.0  ? Protein, ur NEGATIVE NEGATIVE mg/dL  ? Urobilinogen, UA 0.2 0.0 - 1.0 mg/dL  ? Nitrite NEGATIVE NEGATIVE  ? Leukocytes,Ua NEGATIVE NEGATIVE  ? ?Labs Reviewed  ?POCT URINALYSIS DIPSTICK, ED / UC - Abnormal; Notable for the following components:  ?    Result Value  ? pH 8.5 (*)   ? All other components within normal limits  ?POC URINE PREG, ED  ? ? ? ?Allergies  ?Allergen Reactions  ? Coconut Oil Itching  ? ?                                            ?  Past Medical History:  ?Diagnosis Date  ? Asthma   ? ?Social History  ? ?Socioeconomic History  ? Marital status: Married  ?  Spouse name: Not on file  ? Number of children: Not on file  ? Years of education: Not on file  ? Highest education level: Not on file  ?Occupational History  ? Not on file  ?Tobacco Use  ? Smoking status: Every Day  ?  Types: Cigars  ? Smokeless tobacco:  Never  ?Substance and Sexual Activity  ? Alcohol use: Not Currently  ? Drug use: Yes  ?  Types: Marijuana  ? Sexual activity: Yes  ?  Birth control/protection: Pill  ?Other Topics Concern  ? Not on file  ?Social History Narrative  ? Not on file  ? ?Social Determinants of Health  ? ?Financial Resource Strain: Not on file  ?Food Insecurity: Not on file  ?Transportation Needs: Not on file  ?Physical Activity: Not on file  ?Stress: Not on file  ?Social Connections: Not on file  ?Intimate Partner Violence: Not on file  ? ?Family History  ?Problem Relation Age of Onset  ? Healthy Mother   ? Healthy Father   ? ? ?  ?Vanessa Kick, MD ?07/01/21 [phone] ? ?

## 2021-10-02 ENCOUNTER — Ambulatory Visit (HOSPITAL_COMMUNITY): Payer: 59

## 2021-12-14 ENCOUNTER — Ambulatory Visit (INDEPENDENT_AMBULATORY_CARE_PROVIDER_SITE_OTHER): Payer: 59

## 2021-12-14 ENCOUNTER — Ambulatory Visit (HOSPITAL_COMMUNITY)
Admission: EM | Admit: 2021-12-14 | Discharge: 2021-12-14 | Disposition: A | Discharge status: Home / Self Care | Payer: 59 | Attending: Internal Medicine | Admitting: Internal Medicine

## 2021-12-14 ENCOUNTER — Encounter (HOSPITAL_COMMUNITY): Payer: Self-pay | Admitting: Emergency Medicine

## 2021-12-14 DIAGNOSIS — W19XXXA Unspecified fall, initial encounter: Secondary | ICD-10-CM | POA: Diagnosis not present

## 2021-12-14 DIAGNOSIS — S93401A Sprain of unspecified ligament of right ankle, initial encounter: Secondary | ICD-10-CM | POA: Diagnosis not present

## 2021-12-14 DIAGNOSIS — M25571 Pain in right ankle and joints of right foot: Secondary | ICD-10-CM

## 2021-12-14 MED ORDER — IBUPROFEN 800 MG PO TABS
800.0000 mg | ORAL_TABLET | Freq: Once | ORAL | Status: AC
  Administered 2021-12-14: 800 mg via ORAL

## 2021-12-14 MED ORDER — IBUPROFEN 600 MG PO TABS: 600.0000 mg | ORAL_TABLET | Freq: Four times a day (QID) | ORAL | 0 refills | Status: DC | PRN

## 2021-12-14 MED ORDER — IBUPROFEN 800 MG PO TABS
ORAL_TABLET | ORAL | Status: AC
  Filled 2021-12-14: qty 1

## 2021-12-14 MED ORDER — ACETAMINOPHEN 500 MG PO TABS: 1000.0000 mg | ORAL_TABLET | Freq: Four times a day (QID) | ORAL | 0 refills | Status: DC | PRN

## 2021-12-14 NOTE — Discharge Instructions (Signed)
Your x-rays of your ankle were negative for fracture or dislocation. You likely sprained your right ankle.   Wear the ankle brace we provided in the clinic for the next couple of weeks to provide compression, stability, and comfort.  Please rest, ice, and elevate your ankle to help it heal and decrease inflammation.   Take 600mg  ibuprofen every 6 hours or tylenol 1,000 every 6 hours as needed for pain. If needed, you can alternate these medications so that you take one medication every 3 hours. For instance, at noon take ibuprofen, then at 3pm take tylenol, then at 6pm take ibuprofen.   Your next dose of ibuprofen may be overnight tonight at 2am if needed. Your next dose of tylenol may be when you get home if needed.  Call the orthopedic provider listed on your discharge paperwork to schedule a follow-up appointment if your symptoms do not improve in the next 1-2 weeks with supportive care.  Return to urgent care if you experience worsening pain, numbness, tingling, change of color in your skin near the injury, or any other concerning symptoms.  I hope you feel better!

## 2021-12-14 NOTE — ED Provider Notes (Signed)
[place]    CSN: [id number] Arrival date & time: 12/14/21  1554      History   Chief Complaint Chief Complaint  Patient presents with   Fall   Ankle Pain   Leg Injury    HPI Samantha Ashley is a 26 y.o. female.   Patient presents to urgent care for evaluation of right ankle pain and swelling after she fell last night.  Patient was drunk, rolled her ankle while walking, and ended up in a crisscross applesauce position on the ground.  Denies hitting her head, loss of consciousness, and any popping/locking sensations to the ankle.  There is significant swelling of the lateral malleolus of the ankle.  She has not attempted use of any over-the-counter pain medicines prior to arrival urgent care but is having a very difficult time bearing weight on the right foot.  Pain to the right ankle is currently an 8 on a scale of 0-10 in a seated position and increases to a 10 on a scale of 0-10 when she places weight on her ankle.  She has never injured this ankle in the past.  Denies numbness and tingling to the bilateral lower extremities and pain to any other joints in her body.   Fall  Ankle Pain   Past Medical History:  Diagnosis Date   Asthma     Patient Active Problem List   Diagnosis Date Noted   Bipolar 1 disorder ([place]) 11/09/2020   Marijuana abuse 08/23/2020   Bipolar 1 disorder, depressed ([place]) 10/28/2018   Bipolar depression ([place]) 10/15/2018   Schizoaffective disorder, bipolar type ([place]) 10/10/2018   Schizophrenia ([place]) 10/10/2018    History reviewed. No pertinent surgical history.  OB History   No obstetric history on file.      Home Medications    Prior to Admission medications   Medication Sig Start Date End Date Taking? Authorizing Provider  acetaminophen (TYLENOL) 500 MG tablet Take 2 tablets (1,000 mg total) by mouth every 6 (six) hours as needed. 12/14/21  Yes Talbot Grumbling, FNP  ibuprofen (ADVIL) 600 MG tablet Take 1 tablet (600 mg  total) by mouth every 6 (six) hours as needed. 12/14/21  Yes Talbot Grumbling, FNP  albuterol (VENTOLIN HFA) 108 (90 Base) MCG/ACT inhaler Inhale 2 puffs into the lungs every 6 (six) hours as needed for wheezing or shortness of breath.    [provider]  fluticasone (FLONASE) 50 MCG/ACT nasal spray Place 1 spray into both nostrils daily. 08/27/20   Ethelene Hal, NP  JUNEL FE 1.5/30 1.5-30 MG-MCG tablet Take 1 tablet by mouth at bedtime.    [provider]  metFORMIN (GLUCOPHAGE) 500 MG tablet Take 1 tablet (500 mg total) by mouth 2 (two) times daily. 11/13/20   Ethelene Hal, NP  OLANZapine (ZYPREXA) 20 MG tablet Take 1 tablet (20 mg total) by mouth at bedtime. 11/13/20   Ethelene Hal, NP  ondansetron (ZOFRAN-ODT) 4 MG disintegrating tablet Take 1 tablet (4 mg total) by mouth every 8 (eight) hours as needed for nausea or vomiting. 06/26/21   Vanessa Kick, MD    Family History Family History  Problem Relation Age of Onset   Healthy Mother    Healthy Father     Social History Social History   Tobacco Use   Smoking status: Every Day    Types: Cigars   Smokeless tobacco: Never  Substance Use Topics   Alcohol use: Not Currently   Drug use:  Yes    Types: Marijuana     Allergies   Coconut (cocos nucifera)   Review of Systems Review of Systems Per HPI  Physical Exam Triage Vital Signs ED Triage Vitals  Enc Vitals Group     BP 12/14/21 1637 123/88     Pulse Rate 12/14/21 1637 66     Resp 12/14/21 1637 18     Temp 12/14/21 1637 98 F (36.7 C)     Temp Source 12/14/21 1637 Oral     SpO2 12/14/21 1637 98 %     Weight --      Height --      Head Circumference --      Peak Flow --      Pain Score 12/14/21 1635 9     Pain Loc --      Pain Edu? --      Excl. in GC? --    No data found.  Updated Vital Signs BP 123/88 (BP Location: Left Arm)   Pulse 66   Temp 98 F (36.7 C) (Oral)   Resp 18   LMP 12/12/2021   SpO2 98%    Visual Acuity Right Eye Distance:   Left Eye Distance:   Bilateral Distance:    Right Eye Near:   Left Eye Near:    Bilateral Near:     Physical Exam Vitals and nursing note reviewed.  Constitutional:      Appearance: Normal appearance. She is not ill-appearing or toxic-appearing.     Comments: Very pleasant patient sitting on exam in position of comfort table in no acute distress.   HENT:     Head: Normocephalic and atraumatic.     Right Ear: Hearing and external ear normal.     Left Ear: Hearing and external ear normal.     Nose: Nose normal.     Mouth/Throat:     Lips: Pink.     Mouth: Mucous membranes are moist.  Eyes:     General: Lids are normal. Vision grossly intact. Gaze aligned appropriately.     Extraocular Movements: Extraocular movements intact.     Conjunctiva/sclera: Conjunctivae normal.  Pulmonary:     Effort: Pulmonary effort is normal.  Abdominal:     Palpations: Abdomen is soft.  Musculoskeletal:     Cervical back: Neck supple.     Comments: Right ankle: Significant swelling to the lateral malleolus present.  5/5 strength with dorsiflexion and plantarflexion of the right ankle.  Patient is not able to bear weight on the right ankle without significant pain and limp.  No ecchymosis, redness, warmth, or obvious deformity to the right ankle.  Capillary refill is less than 3.  +2 dorsalis pedis pulse present on the right.  Range of motion is intact with dorsi flexion and plantarflexion  Skin:    General: Skin is warm and dry.     Capillary Refill: Capillary refill takes less than 2 seconds.     Findings: No rash.  Neurological:     General: No focal deficit present.     Mental Status: She is alert and oriented to person, place, and time. Mental status is at baseline.     Cranial Nerves: No dysarthria or facial asymmetry.     Gait: Gait is intact.  Psychiatric:        Mood and Affect: Mood normal.        Speech: Speech normal.        Behavior: Behavior  normal.  Thought Content: Thought content normal.        Judgment: Judgment normal.      UC Treatments / Results  Labs (all labs ordered are listed, but only abnormal results are displayed) Labs Reviewed - No data to display  EKG   Radiology DG Ankle Complete Right  Result Date: 12/14/2021 CLINICAL DATA:  Fall with ankle swelling EXAM: RIGHT ANKLE - COMPLETE 3+ VIEW COMPARISON:  None Available. FINDINGS: There is no acute fracture or dislocation. Ankle alignment is normal. The ankle mortise is intact. The joint spaces are preserved. There is soft tissue swelling over the lateral malleolus. IMPRESSION: No fracture or dislocation. Soft tissue swelling over the lateral malleolus. Electronically Signed   By: Lesia Hausen M.D.   On: 12/14/2021 17:10    Procedures Procedures (including critical care time)  Medications Ordered in UC Medications  ibuprofen (ADVIL) tablet 800 mg (has no administration in time range)    Initial Impression / Assessment and Plan / UC Course  I have reviewed the triage vital signs and the nursing notes.  Pertinent labs & imaging results that were available during my care of the patient were reviewed by me and considered in my medical decision making (see chart for details).   1.  Sprain of right ankle due to fall X-ray negative for acute bony abnormality and fracture.  Patient placed in ankle brace to stabilize ankle and provided crutches for assistance with ambulation.  Walking referral to orthopedics given.  RICE advised over the next few days as well as supportive shoes with ankle brace.  Patient to follow-up with orthopedics if no improvement in symptoms in the next couple of weeks.  Ibuprofen 800 mg given in the clinic today.  She may use 600 mg of ibuprofen and 1000 g of Tylenol every 6 hours as needed for pain at home.  Advised patient to take this with food to avoid stomach upset.  Return to urgent care if any new or worsening symptoms if needed.   Strict ED return precautions given.  Otherwise follow-up with orthopedics.  Work note given.   Discussed physical exam and available lab work findings in clinic with patient.  Counseled patient regarding appropriate use of medications and potential side effects for all medications recommended or prescribed today. Discussed red flag signs and symptoms of worsening condition,when to call the PCP office, return to urgent care, and when to seek higher level of care in the emergency department. Patient verbalizes understanding and agreement with plan. All questions answered. Patient discharged in stable condition.  Final Clinical Impressions(s) / UC Diagnoses   Final diagnoses:  Sprain of right ankle, unspecified ligament, initial encounter  Fall, initial encounter     Discharge Instructions      Your x-rays of your ankle were negative for fracture or dislocation. You likely sprained your right ankle.   Wear the ankle brace we provided in the clinic for the next couple of weeks to provide compression, stability, and comfort.  Please rest, ice, and elevate your ankle to help it heal and decrease inflammation.   Take 600mg  ibuprofen every 6 hours or tylenol 1,000 every 6 hours as needed for pain. If needed, you can alternate these medications so that you take one medication every 3 hours. For instance, at noon take ibuprofen, then at 3pm take tylenol, then at 6pm take ibuprofen.   Your next dose of ibuprofen may be overnight tonight at 2am if needed. Your next dose of tylenol may be when  you get home if needed.  Call the orthopedic provider listed on your discharge paperwork to schedule a follow-up appointment if your symptoms do not improve in the next 1-2 weeks with supportive care.  Return to urgent care if you experience worsening pain, numbness, tingling, change of color in your skin near the injury, or any other concerning symptoms.  I hope you feel better!    ED Prescriptions      Medication Sig Dispense Auth. Provider   ibuprofen (ADVIL) 600 MG tablet Take 1 tablet (600 mg total) by mouth every 6 (six) hours as needed. 30 tablet Joella Prince M, FNP   acetaminophen (TYLENOL) 500 MG tablet Take 2 tablets (1,000 mg total) by mouth every 6 (six) hours as needed. 30 tablet Talbot Grumbling, FNP      PDMP not reviewed this encounter.   Talbot Grumbling, [place] 12/14/21 1746

## 2021-12-14 NOTE — ED Triage Notes (Signed)
Pt reports was drinking last night. Larey Seat and was able to walk on it afterwards. This morning woke up with right ankle swelling and unable to bear weight.

## 2022-09-22 ENCOUNTER — Emergency Department (HOSPITAL_COMMUNITY)
Admission: EM | Admit: 2022-09-22 | Discharge: 2022-09-29 | Disposition: A | Discharge status: Home / Self Care | Payer: Medicaid Other | Attending: Emergency Medicine | Admitting: Emergency Medicine

## 2022-09-22 ENCOUNTER — Other Ambulatory Visit: Payer: Self-pay

## 2022-09-22 ENCOUNTER — Encounter (HOSPITAL_COMMUNITY): Payer: Self-pay

## 2022-09-22 DIAGNOSIS — F250 Schizoaffective disorder, bipolar type: Secondary | ICD-10-CM | POA: Diagnosis not present

## 2022-09-22 DIAGNOSIS — R451 Restlessness and agitation: Secondary | ICD-10-CM | POA: Diagnosis present

## 2022-09-22 DIAGNOSIS — J45909 Unspecified asthma, uncomplicated: Secondary | ICD-10-CM | POA: Insufficient documentation

## 2022-09-22 DIAGNOSIS — Z79899 Other long term (current) drug therapy: Secondary | ICD-10-CM | POA: Insufficient documentation

## 2022-09-22 DIAGNOSIS — F29 Unspecified psychosis not due to a substance or known physiological condition: Secondary | ICD-10-CM | POA: Diagnosis not present

## 2022-09-22 DIAGNOSIS — F1210 Cannabis abuse, uncomplicated: Secondary | ICD-10-CM | POA: Diagnosis not present

## 2022-09-22 LAB — I-STAT BETA HCG BLOOD, ED (MC, WL, AP ONLY): I-stat hCG, quantitative: <5 m[IU]/mL (ref <5)

## 2022-09-22 LAB — CBC WITH DIFFERENTIAL/PLATELET
Abs Immature Granulocytes: 0.04 10*3/uL (ref 0.00–0.07)
Basophils Absolute: 0 10*3/uL (ref 0.0–0.1)
Basophils Relative: 0 %
Eosinophils Absolute: 0.2 10*3/uL (ref 0.0–0.5)
Eosinophils Relative: 2 %
HCT: 39.7 % (ref 36.0–46.0)
Hemoglobin: 12.7 g/dL (ref 12.0–15.0)
Immature Granulocytes: 0 %
Lymphocytes Relative: 37 %
Lymphs Abs: 3.6 10*3/uL (ref 0.7–4.0)
MCH: 26.4 pg (ref 26.0–34.0)
MCHC: 32 g/dL (ref 30.0–36.0)
MCV: 82.5 fL (ref 80.0–100.0)
Monocytes Absolute: 0.7 10*3/uL (ref 0.1–1.0)
Monocytes Relative: 8 %
Neutro Abs: 5 10*3/uL (ref 1.7–7.7)
Neutrophils Relative %: 53 %
Platelets: 276 10*3/uL (ref 150–400)
RBC: 4.81 MIL/uL (ref 3.87–5.11)
RDW: 17.7 % — ABNORMAL HIGH (ref 11.5–15.5)
WBC: 9.6 10*3/uL (ref 4.0–10.5)
nRBC: 0 % (ref 0.0–0.2)

## 2022-09-22 LAB — COMPREHENSIVE METABOLIC PANEL
ALT: 24 U/L (ref 0–44)
AST: 29 U/L (ref 15–41)
Albumin: 3.8 g/dL (ref 3.5–5.0)
Alkaline Phosphatase: 63 U/L (ref 38–126)
Anion gap: 11 (ref 5–15)
BUN: 10 mg/dL (ref 6–20)
CO2: 24 mmol/L (ref 22–32)
Calcium: 9 mg/dL (ref 8.9–10.3)
Chloride: 103 mmol/L (ref 98–111)
Creatinine, Ser: 1.11 mg/dL — ABNORMAL HIGH (ref 0.44–1.00)
GFR, Estimated: >60 mL/min (ref >60)
Glucose, Bld: 103 mg/dL — ABNORMAL HIGH (ref 70–99)
Potassium: 2.8 mmol/L — ABNORMAL LOW (ref 3.5–5.1)
Sodium: 138 mmol/L (ref 135–145)
Total Bilirubin: 0.5 mg/dL (ref 0.3–1.2)
Total Protein: 8.1 g/dL (ref 6.5–8.1)

## 2022-09-22 LAB — CK: Total CK: 268 U/L — ABNORMAL HIGH (ref 38–234)

## 2022-09-22 LAB — ETHANOL: Alcohol, Ethyl (B): <10 mg/dL (ref <10)

## 2022-09-22 LAB — ACETAMINOPHEN LEVEL: Acetaminophen (Tylenol), Serum: <10 ug/mL — ABNORMAL LOW (ref 10–30)

## 2022-09-22 LAB — SALICYLATE LEVEL: Salicylate Lvl: <7 mg/dL — ABNORMAL LOW (ref 7.0–30.0)

## 2022-09-22 MED ORDER — STERILE WATER FOR INJECTION IJ SOLN
INTRAMUSCULAR | Status: AC
  Administered 2022-09-22: 10 mL
  Filled 2022-09-22: qty 10

## 2022-09-22 MED ORDER — ACETAMINOPHEN 325 MG PO TABS
650.0000 mg | ORAL_TABLET | ORAL | Status: DC | PRN
  Administered 2022-09-25 (×2): 650 mg via ORAL
  Filled 2022-09-22 (×2): qty 2

## 2022-09-22 MED ORDER — SODIUM CHLORIDE 0.9 % IV BOLUS
1000.0000 mL | Freq: Once | INTRAVENOUS | Status: AC
  Administered 2022-09-22: 1000 mL via INTRAVENOUS

## 2022-09-22 MED ORDER — FLUOXETINE HCL 20 MG PO CAPS
20.0000 mg | ORAL_CAPSULE | Freq: Every day | ORAL | Status: DC
  Administered 2022-09-23 – 2022-09-29 (×7): 20 mg via ORAL
  Filled 2022-09-22 (×7): qty 1

## 2022-09-22 MED ORDER — OLANZAPINE 10 MG PO TABS
10.0000 mg | ORAL_TABLET | Freq: Every day | ORAL | Status: DC
  Administered 2022-09-23 – 2022-09-26 (×4): 10 mg via ORAL
  Filled 2022-09-22 (×4): qty 1

## 2022-09-22 MED ORDER — DIPHENHYDRAMINE HCL 50 MG/ML IJ SOLN
50.0000 mg | Freq: Once | INTRAMUSCULAR | Status: AC
  Administered 2022-09-22: 50 mg via INTRAMUSCULAR
  Filled 2022-09-22: qty 1

## 2022-09-22 MED ORDER — ZIPRASIDONE MESYLATE 20 MG IM SOLR
20.0000 mg | Freq: Once | INTRAMUSCULAR | Status: AC
  Administered 2022-09-22: 20 mg via INTRAMUSCULAR
  Filled 2022-09-22: qty 20

## 2022-09-22 MED ORDER — NICOTINE 21 MG/24HR TD PT24
21.0000 mg | MEDICATED_PATCH | Freq: Every day | TRANSDERMAL | Status: DC
  Administered 2022-09-22 – 2022-09-29 (×2): 21 mg via TRANSDERMAL
  Filled 2022-09-22 (×6): qty 1

## 2022-09-22 MED ORDER — ALUM & MAG HYDROXIDE-SIMETH 200-200-20 MG/5ML PO SUSP: 30.0000 mL | Freq: Four times a day (QID) | ORAL | Status: DC | PRN

## 2022-09-22 MED ORDER — ONDANSETRON HCL 4 MG PO TABS
4.0000 mg | ORAL_TABLET | Freq: Three times a day (TID) | ORAL | Status: DC | PRN
  Administered 2022-09-26: 4 mg via ORAL
  Filled 2022-09-22: qty 1

## 2022-09-22 MED ORDER — LORAZEPAM 2 MG/ML IJ SOLN
2.0000 mg | Freq: Once | INTRAMUSCULAR | Status: AC
  Administered 2022-09-22: 2 mg via INTRAMUSCULAR
  Filled 2022-09-22: qty 1

## 2022-09-22 MED ORDER — POTASSIUM CHLORIDE CRYS ER 20 MEQ PO TBCR
40.0000 meq | EXTENDED_RELEASE_TABLET | Freq: Every day | ORAL | Status: AC
  Administered 2022-09-23 – 2022-09-27 (×5): 40 meq via ORAL
  Filled 2022-09-22 (×5): qty 2

## 2022-09-22 NOTE — ED Notes (Signed)
Pt has one belonging bag placed in cabinets 13-15 and RES A.

## 2022-09-22 NOTE — ED Notes (Signed)
Urine sample not received yet. Pt did not give enough urine. Will try again when pt wakes up.

## 2022-09-22 NOTE — ED Triage Notes (Signed)
Pt BIB EMS from home for IVC orders from police. Per EMS pt was given IM 5mg  haldol and 5mg  versed.

## 2022-09-22 NOTE — ED Notes (Signed)
Upon pt arrival, pt talking erratically, yelling at staff, and spitting. Pt placed in restraints and given IM meds per Long, MD.

## 2022-09-22 NOTE — ED Notes (Signed)
Pt changed into purple scrubs and wanded by security  

## 2022-09-22 NOTE — ED Provider Notes (Signed)
Emergency Department Provider Note   I have reviewed the triage vital signs and the nursing notes.   HISTORY  Chief Complaint IVC   HPI Samantha Ashley is a 27 y.o. female with PMH of Bipolar disorder and substance induced psychosis presents to the emergency department for evaluation of increased agitation and hallucination in the setting of medication non-compliance.  IVC paperwork was taken out by the patient's family at 2 PM today. Police attempted to serve the IVC and patient was combative and found with disorganized thought process. They report she was smoking something (marijuana) upon their arrival. She received Haldol and Versed PTA but remains very agitated and requiring restraint.   Mom arrives at bedside and tells me that she took out the IVC paperwork today because patient has become increasingly agitated and disorganized.  She has been up for at least the last 36 hours, continuously posting on Facebook.  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  ____________________________________________   PHYSICAL EXAM:  VITAL SIGNS: ED Triage Vitals  Enc Vitals Group     BP 09/22/22 1930 122/84     Pulse Rate 09/22/22 1930 (!) 111     Resp 09/22/22 1938 (!) 24     Temp --      Temp src --      SpO2 09/22/22 1930 99 %   Constitutional: Restrained on gurney with EMS and Police at bedside. Shouting at staff. Agitated and attempting to free her extremities.  Eyes: Conjunctivae are normal.  Head: Atraumatic. Nose: No congestion/rhinnorhea. Mouth/Throat: Mucous membranes are moist.   Neck: No stridor.   Cardiovascular: Normal rate, regular rhythm. Good peripheral circulation. Grossly normal heart sounds.   Respiratory: Normal respiratory effort.  No retractions. Lungs  CTAB. Gastrointestinal: Soft and nontender. No distention.  Musculoskeletal: No gross deformities of extremities. Neurologic:  Normal speech and language.  Skin:  Skin is warm, dry and intact. No rash noted. Psychiatric: Mood and affect are agitated. Speech is pressured and thought process is disorganized. Appears to be responding to internal stimuli.   ____________________________________________   LABS (all labs ordered are listed, but only abnormal results are displayed)  Labs Reviewed  ACETAMINOPHEN LEVEL - Abnormal; Notable for the following components:      Result Value   Acetaminophen (Tylenol), Serum <10 (*)    All other components within normal limits  COMPREHENSIVE METABOLIC PANEL - Abnormal; Notable for the following components:   Potassium 2.8 (*)    Glucose, Bld 103 (*)    Creatinine, Ser 1.11 (*)    All other components within normal limits  SALICYLATE LEVEL - Abnormal; Notable for the following components:   Salicylate Lvl <7.0 (*)    All other components within normal limits  CBC WITH DIFFERENTIAL/PLATELET - Abnormal; Notable for the following components:   RDW 17.7 (*)    All other components within normal limits  CK - Abnormal; Notable for the following components:   Total CK 268 (*)    All other components within normal limits  ETHANOL  RAPID URINE DRUG SCREEN, HOSP PERFORMED  I-STAT BETA HCG BLOOD, ED ([facility], [facility], [facility] ONLY)   ____________________________________________  EKG   EKG Interpretation  Date/Time:  Tuesday Sep 22 2022 20:05:44 EDT Ventricular Rate:  96  PR Interval:  142 QRS Duration: 87 QT Interval:  383 QTC Calculation: 484 R Axis:   112 Text Interpretation: Right and left arm electrode reversal, interpretation assumes no reversal Sinus rhythm LAE, consider biatrial enlargement Right axis deviation Borderline Q waves in lateral leads Abnormal T, consider ischemia, lateral leads Confirmed by Alona Bene [phone]) on 09/22/2022 8:07:47 PM        ____________________________________________   PROCEDURES  Procedure(s) performed:   Procedures  CRITICAL CARE Performed by: Maia Plan Total critical care time: 35 minutes Critical care time was exclusive of separately billable procedures and treating other patients. Critical care was necessary to treat or prevent imminent or life-threatening deterioration. Critical care was time spent personally by me on the following activities: development of treatment plan with patient and/or surrogate as well as nursing, discussions with consultants, evaluation of patient's response to treatment, examination of patient, obtaining history from patient or surrogate, ordering and performing treatments and interventions, ordering and review of laboratory studies, ordering and review of radiographic studies, pulse oximetry and re-evaluation of patient's condition.  Alona Bene, MD Emergency Medicine  ____________________________________________   INITIAL IMPRESSION / ASSESSMENT AND PLAN / ED COURSE  Pertinent labs & imaging results that were available during my care of the patient were reviewed by me and considered in my medical decision making (see chart for details).   This patient is Presenting for Evaluation of AMS, which does require a range of treatment options, and is a complaint that involves a high risk of morbidity and mortality.  The Differential Diagnoses includes but is not exclusive to alcohol, illicit or prescription medications, intracranial pathology such as stroke, intracerebral hemorrhage, fever or infectious causes including sepsis, hypoxemia, uremia, trauma, endocrine related disorders such as diabetes, hypoglycemia, thyroid-related diseases, etc.   Critical Interventions-    Medications  potassium chloride SA (KLOR-CON M) CR tablet 40 mEq (has no administration in time range)  FLUoxetine (PROZAC) capsule 20 mg (has no administration in time range)  OLANZapine  (ZYPREXA) tablet 10 mg (has no administration in time range)  ziprasidone (GEODON) injection 20 mg (20 mg Intramuscular Given 09/22/22 1952)  LORazepam (ATIVAN) injection 2 mg (2 mg Intramuscular Given 09/22/22 1953)  diphenhydrAMINE (BENADRYL) injection 50 mg (50 mg Intramuscular Given 09/22/22 1953)  sterile water (preservative free) injection (10 mLs  Given 09/22/22 1953)  sodium chloride 0.9 % bolus 1,000 mL (1,000 mLs Intravenous Bolus 09/22/22 2006)    Reassessment after intervention:  patient resting comfortably on monitor.    I did obtain Additional Historical Information from Police at bedside.  I decided to review pertinent External Data, and in summary no recent TTS evaluation.    Clinical Laboratory Tests Ordered, included only mildly elevated CK. Not consistent with rhabdomyolysis.  Mild hypokalemia to 2.8. Plan for PO supplementation only x 5 days. hCG negative. No AKI.   Cardiac Monitor Tracing which shows sinus tachycardia.    Social Determinants of Health Risk patient is a marijuana user.   Consult complete with TTS.   Medical Decision Making: Summary:  Patient presents emergency department agitated and in restraints with EMS/police.  She is under IVC.  I have completed the first exam paperwork upon arrival.  Patient administered additional medication here and is now calm and cooperative.  Plan for IV fluids while waiting on labs.  CK ordered as well but struggle was not particularly.   Reevaluation with update and discussion with patient/Mom. Patient is medically clear and stable for TTS evaluation. Home Fluoxetine  and Olanzapine ordered per Mom's records.   Considered admission but no indication for medical admit.   Patient's presentation is most consistent with acute presentation with potential threat to life or bodily function.   Disposition: pending   ____________________________________________  FINAL CLINICAL IMPRESSION(S) / ED DIAGNOSES  Final diagnoses:   Psychosis, unspecified psychosis type (HCC)    Note:  This document was prepared using Dragon voice recognition software and may include unintentional dictation errors.  Alona Bene, MD, [facility] Emergency Medicine    Deneisha Dade, Arlyss Repress, MD 09/22/22 [phone]

## 2022-09-22 NOTE — BH Assessment (Addendum)
IRIS consult initiated for patient @ 2244. The IRIS Coordinator is Leotis Shames [phone]. Patient will be evaluated by Dr. Katrinka Blazing at 2:15 am. Alona Bene, MD and Deon Pilling, RN provided disposition updates.

## 2022-09-22 NOTE — ED Notes (Addendum)
Pt speaking aggressively to staff. Security at bedside. Pt attempted to spit at staff while cursing everyone out.

## 2022-09-23 LAB — RAPID URINE DRUG SCREEN, HOSP PERFORMED
Amphetamines: NOT DETECTED
Barbiturates: NOT DETECTED
Benzodiazepines: POSITIVE — AB
Cocaine: NOT DETECTED
Opiates: NOT DETECTED
Tetrahydrocannabinol: POSITIVE — AB

## 2022-09-23 MED ORDER — TRAZODONE HCL 100 MG PO TABS
50.0000 mg | ORAL_TABLET | Freq: Every evening | ORAL | Status: DC | PRN
  Administered 2022-09-23 – 2022-09-26 (×4): 50 mg via ORAL
  Filled 2022-09-23 (×4): qty 1

## 2022-09-23 MED ORDER — MUPIROCIN CALCIUM 2 % EX CREA
TOPICAL_CREAM | Freq: Two times a day (BID) | CUTANEOUS | Status: AC
  Administered 2022-09-23: 1 via TOPICAL
  Filled 2022-09-23 (×2): qty 15

## 2022-09-23 MED ORDER — LORAZEPAM 1 MG PO TABS
2.0000 mg | ORAL_TABLET | Freq: Three times a day (TID) | ORAL | Status: DC | PRN
  Administered 2022-09-23 – 2022-09-29 (×14): 2 mg via ORAL
  Filled 2022-09-23 (×13): qty 2

## 2022-09-23 NOTE — ED Notes (Signed)
Pt wanded at bedside by security.

## 2022-09-23 NOTE — ED Provider Notes (Signed)
Emergency Medicine Observation Re-evaluation Note  Samantha Ashley is a 27 y.o. female, seen on rounds today.  Pt initially presented to the ED for complaints of IVC Currently, the patient is awake with no acute complaints.  Physical Exam  BP 106/68 (BP Location: Right Arm)   Pulse 69   Temp 98.2 F (36.8 C) (Oral)   Resp 18   SpO2 100%  Physical Exam General: Awake and alert no acute distress Cardiac: Regular rate Lungs: No increased work of breathing Psych: Calm, cooperative  ED Course / MDM  EKG:EKG Interpretation  Date/Time:  Tuesday Sep 22 2022 20:05:44 EDT Ventricular Rate:  96 PR Interval:  142 QRS Duration: 87 QT Interval:  383 QTC Calculation: 484 R Axis:   112 Text Interpretation: Right and left arm electrode reversal, interpretation assumes no reversal Sinus rhythm LAE, consider biatrial enlargement Right axis deviation Borderline Q waves in lateral leads Abnormal T, consider ischemia, lateral leads Confirmed by Alona Bene [phone]) on 09/22/2022 8:07:47 PM  I have reviewed the labs performed to date as well as medications administered while in observation.  Recent changes in the last 24 hours include medically cleared, pending psych evaluation.  Plan  Current plan is for psych eval for disposition.    Elayne Snare K, DO 09/23/22 [phone]

## 2022-09-23 NOTE — ED Notes (Signed)
Pt started becoming louder and yelling and hitting things in the area where her room is. This writer ask her what can we do to calm her down, and is she bored.  She said we can't do anything and she is bored and she is becoming agitated.  She was ask if she would take her ativan to help calm her before she gets worse and she agreed to take the ativan.

## 2022-09-23 NOTE — ED Notes (Signed)
Pt belongings is in locker 30

## 2022-09-23 NOTE — Progress Notes (Signed)
Pt meets inpatient criteria per Joaquin Courts, NP. CSW has requested [facility], RN to review. CSW/ Disposition team will assist and follow with placement.   [name], MSW, [facility] 09/23/2022 11:26 PM

## 2022-09-23 NOTE — Consult Note (Signed)
[facility] ED ASSESSMENT   Reason for Consult:  Psychiatric Consult  Referring Physician:  Alona Ashley. MD  Patient Identification: Samantha Ashley MRN:  [id number] ED Chief Complaint: Schizoaffective disorder, bipolar type [facility])  Diagnosis:  Principal Problem:   Schizoaffective disorder, bipolar type (HCC) Active Problems:   Marijuana abuse   ED Assessment Time Calculation: Start Time: 1400 Stop Time: 1445 Total Time in Minutes (Assessment Completion): 45   Subjective:   Samantha Ashley is a 27 y.o. female patient admitted, under IVC petition, petitioned by mother, Samantha Ashley.  Applicable portion of IVC Petition reads:  RESPONDENT NON COMPLIANT WITH MEDICATION REGIMEN. FAMILY STATES SHE HAS BOT BEEN SLEEPING, EATING, OR TENDING TO PERSONAL HYGIENE. RESPONDENT SAYS SHE SEES AND HEARS DEMONS, MAKING SOCIAL MEDIA POSTS ABOUT HERSELF BEING DEAD.  See EMR Media  for petition in it's entirely   Samantha Ashley, 27 y.o., female patient seen face to face by this provider, consulted with Dr. Viviano Ashley; and chart reviewed on 09/23/22.  HPI:   Samantha Ashley, 27 y.o., with a history of Schizoaffective Disorder, seen today, face to face, per TTS consult, for psychiatric consult evaluation. Samantha Ashley mother is initially present when this writer presents to evaluate patient/ Mother,  Samantha Ashley [id number], reports that patient has not been sleeping for several days. She completely stopped taking her medication about one month ago and has decompensated mentally since that time. Samantha Ashley reports that she has had recent stressors such as going through a divorce with her current wife, recently losing her job due to someone making false accusation that she posted inappropriate things on the Facebook, and her cat recently died. She reports that taking psychiatric medication,  however had to discontinue therapy due loss of employment. For medication management she is followed by Dr. Maggie Ashley, and she sees him  every 3 months.  Her current psychiatric medication regimen of Prozac 20 mg daily and Zyprexa 10 mg at bedtime.  Her psychiatric diagnoses consist of schizoaffective disorder, generalized anxiety disorder, and there is previous documentation that patient has been diagnosed with bipolar 1 disorder.  Patient denies any prior attempts of suicide.  Patient has had multiple psychiatric admissions related to psychosis related to decompensation of schizoaffective disorder.  Patient was last hospitalized November 09, 2020.  Patient endorses routine use of marijuana however denies any other illicit substances.  She and endorses that she drinks alcohol socially but has not had any alcohol recently.  When asked about information documented within IVC petition patient admits that she has been seeing demons however states that this Clinical research associate" as the demons asked people not of scary monsters...demons are part people and part human".  Per documentation from EDP patient was apparently picked up by law enforcement from her home and she was noted to be outside smoking marijuana became severely agitated and aggressive requiring Versed and Haldol due to severe agitation.  Patient tells this Clinical research associate that she has a raised warm area on her right upper arm at the site in which medications were given to her during agitation.  She denies any swelling of lips, difficulty breathing, swelling of the arm however has a slight reddened area on her left arm.  Patient endorses that she require some medication management and she is not oppositional to an inpatient psychiatric admission.  She reports that she prefers to go to [facility] if possible.  She states" there is a lot of people over there and they know me".  Patient has been restarted  on her home psychiatric medication regiment.  With the exception of last night patient has had no agitation or behavioral concerns during current shift. Samantha Ashley denies suicidal ideations, homicidal ideation,  auditory or visual hallucinations however patient has stipulating that she sees demons when looking at certain people.  Patient does appear manic and euphoric which is consistent with psychosis secondary to schizoaffective disorder bipolar type.  Patient is easily redirectable and has not had any behavioral episodes and does not appear to be responding to internal stimuli during evaluation today.  Patient meets psychiatric inpatient criteria for treatment of acute mental health crisis and she is unable to verbally contract for safety due to her current mental state.  Patient is currently under IVC petition and petition should be available due to current mental health state, as patient is actively psychotic.  Risk to Self or Others: Is the patient at risk to self? Yes, currently at risk to self only do to inability to sleep and active psychosis ( mania and delusional thinking) however the patient is not endorsing any self-harm, suicidal ideation and has not had any active suicidality. Has the patient been a risk to self in the past 6 months? No Has the patient been a risk to self within the distant past? No Is the patient a risk to others? No Has the patient been a risk to others in the past 6 months? No Has the patient been a risk to others within the distant past? No  [place] Scale:  Flowsheet Row ED from 06/26/2021 in [facility] ED from 05/29/2021 in [facility] from 12/16/2020 in [facility]  C-SSRS RISK CATEGORY No Risk No Risk No Risk       Past Medical History:  Past Medical History:  Diagnosis Date   Asthma    History reviewed. No pertinent surgical history. Family History:  Family History  Problem Relation Age of Onset   Healthy Mother    Healthy Father    Social History:  Social History   Substance and Sexual Activity  Alcohol Use Not Currently     Social  History   Substance and Sexual Activity  Drug Use Yes   Types: Marijuana    Social History   Socioeconomic History   Marital status: Married    Spouse name: Not on file   Number of children: Not on file   Years of education: Not on file   Highest education level: Not on file  Occupational History   Not on file  Tobacco Use   Smoking status: Every Day    Types: Cigars   Smokeless tobacco: Never  Substance and Sexual Activity   Alcohol use: Not Currently   Drug use: Yes    Types: Marijuana   Sexual activity: Yes    Birth control/protection: Pill  Other Topics Concern   Not on file  Social History Narrative   Not on file   Social Determinants of Health   Financial Resource Strain: Not on file  Food Insecurity: Not on file  Transportation Needs: Not on file  Physical Activity: Not on file  Stress: Not on file  Social Connections: Not on file   Additional Social History:    Allergies:   Allergies  Allergen Reactions   Coconut (Cocos Nucifera) Itching    Labs:  Results for orders placed or performed during the hospital encounter of 09/22/22 (from the past 48 hour(s))  Acetaminophen level     Status: Abnormal   Collection Time: 09/22/22  8:02 PM  Result Value Ref Range   Acetaminophen (Tylenol), Serum <10 (L) 10 - 30 ug/mL    Comment: (NOTE) Therapeutic concentrations vary significantly. A range of 10-30 ug/mL  may be an effective concentration for many patients. However, some  are best treated at concentrations outside of this range. Acetaminophen concentrations >150 ug/mL at 4 hours after ingestion  and >50 ug/mL at 12 hours after ingestion are often associated with  toxic reactions.  Performed at [facility], [address] [place], [place] [address]   Comprehensive metabolic panel     Status: Abnormal   Collection Time: 09/22/22  8:02 PM  Result Value Ref Range   Sodium 138 135 - 145 mmol/L   Potassium 2.8 (L) 3.5 - 5.1 mmol/L    Chloride 103 98 - 111 mmol/L   CO2 24 22 - 32 mmol/L   Glucose, Bld 103 (H) 70 - 99 mg/dL    Comment: Glucose reference range applies only to samples taken after fasting for at least 8 hours.   BUN 10 6 - 20 mg/dL   Creatinine, Ser 6.04 (H) 0.44 - 1.00 mg/dL   Calcium 9.0 8.9 - 54.0 mg/dL   Total Protein 8.1 6.5 - 8.1 g/dL   Albumin 3.8 3.5 - 5.0 g/dL   AST 29 15 - 41 U/L   ALT 24 0 - 44 U/L   Alkaline Phosphatase 63 38 - 126 U/L   Total Bilirubin 0.5 0.3 - 1.2 mg/dL   GFR, Estimated >98 >11 mL/min    Comment: (NOTE) Calculated using the CKD-EPI Creatinine Equation (2021)    Anion gap 11 5 - 15    Comment: Performed at [facility], [address], [place], [place] [address]  Ethanol     Status: None   Collection Time: 09/22/22  8:02 PM  Result Value Ref Range   Alcohol, Ethyl (B) <10 <10 mg/dL    Comment: (NOTE) Lowest detectable limit for serum alcohol is 10 mg/dL.  For medical purposes only. Performed at [facility], [address], [place], [place] [address]   Salicylate level     Status: Abnormal   Collection Time: 09/22/22  8:02 PM  Result Value Ref Range   Salicylate Lvl <7.0 (L) 7.0 - 30.0 mg/dL    Comment: Performed at [facility], [address], [place], [place] [address]  CBC with Differential     Status: Abnormal   Collection Time: 09/22/22  8:02 PM  Result Value Ref Range   WBC 9.6 4.0 - 10.5 K/uL   RBC 4.81 3.87 - 5.11 MIL/uL   Hemoglobin 12.7 12.0 - 15.0 g/dL   HCT 57.8 46.9 - 62.9 %   MCV 82.5 80.0 - 100.0 fL   MCH 26.4 26.0 - 34.0 pg   MCHC 32.0 30.0 - 36.0 g/dL   RDW 52.8 (H) 41.3 - 24.4 %   Platelets 276 150 - 400 K/uL   nRBC 0.0 0.0 - 0.2 %   Neutrophils Relative % 53 %   Neutro Abs 5.0 1.7 - 7.7 K/uL   Lymphocytes Relative 37 %   Lymphs Abs 3.6 0.7 - 4.0 K/uL   Monocytes Relative 8 %   Monocytes Absolute 0.7 0.1 - 1.0 K/uL   Eosinophils Relative 2 %   Eosinophils Absolute 0.2 0.0 -  0.5 K/uL   Basophils Relative 0 %  Basophils Absolute 0.0 0.0 - 0.1 K/uL   Immature Granulocytes 0 %   Abs Immature Granulocytes 0.04 0.00 - 0.07 K/uL    Comment: Performed at [facility], [address] [place], [place] [address]  CK     Status: Abnormal   Collection Time: 09/22/22  8:02 PM  Result Value Ref Range   Total CK 268 (H) 38 - 234 U/L    Comment: Performed at [facility], [address], [place], [place] [address]  I-Stat beta hCG blood, ED     Status: None   Collection Time: 09/22/22  8:08 PM  Result Value Ref Range   I-stat hCG, quantitative <5.0 <5 mIU/mL   Comment 3            Comment:   GEST. AGE      CONC.  (mIU/mL)   <=1 WEEK        5 - 50     2 WEEKS       50 - 500     3 WEEKS       100 - 10,000     4 WEEKS     1,000 - 30,000        FEMALE AND NON-PREGNANT FEMALE:     LESS THAN 5 mIU/mL     Current Facility-Administered Medications  Medication Dose Route Frequency Provider Last Rate Last Admin   acetaminophen (TYLENOL) tablet 650 mg  650 mg Oral Q4H PRN Long, Arlyss Repress, MD       alum & mag hydroxide-simeth (MAALOX/MYLANTA) 200-200-20 MG/5ML suspension 30 mL  30 mL Oral Q6H PRN Long, Arlyss Repress, MD       FLUoxetine (PROZAC) capsule 20 mg  20 mg Oral Daily Long, Arlyss Repress, MD   20 mg at 09/23/22 1244   LORazepam (ATIVAN) tablet 2 mg  2 mg Oral Q8H PRN Bing Neighbors, NP       mupirocin cream (BACTROBAN) 2 %   Topical BID Bing Neighbors, NP       nicotine (NICODERM CQ - dosed in mg/24 hours) patch 21 mg  21 mg Transdermal Daily Long, Arlyss Repress, MD   21 mg at 09/22/22 2232   OLANZapine (ZYPREXA) tablet 10 mg  10 mg Oral QHS Long, Arlyss Repress, MD       ondansetron [facility]) tablet 4 mg  4 mg Oral Q8H PRN Long, Arlyss Repress, MD       potassium chloride SA (KLOR-CON M) CR tablet 40 mEq  40 mEq Oral Daily Long, Arlyss Repress, MD   40 mEq at 09/23/22 0737   traZODone (DESYREL) tablet 50 mg  50 mg Oral QHS PRN Bing Neighbors, NP        Current Outpatient Medications  Medication Sig Dispense Refill   acetaminophen (TYLENOL) 500 MG tablet Take 2 tablets (1,000 mg total) by mouth every 6 (six) hours as needed. (Patient taking differently: Take 1,000 mg by mouth every 6 (six) hours as needed for mild pain.) 30 tablet 0   albuterol (VENTOLIN HFA) 108 (90 Base) MCG/ACT inhaler Inhale 2 puffs into the lungs every 6 (six) hours as needed for wheezing or shortness of breath.     fluticasone (FLONASE) 50 MCG/ACT nasal spray Place 1 spray into both nostrils daily. 9.9 mL 0   ibuprofen (ADVIL) 600 MG tablet Take 1 tablet (600 mg total) by mouth every 6 (six) hours as needed. 30 tablet 0   JUNEL FE 1.5/30 1.5-30  MG-MCG tablet Take 1 tablet by mouth at bedtime.     metFORMIN (GLUCOPHAGE) 500 MG tablet Take 1 tablet (500 mg total) by mouth 2 (two) times daily. 60 tablet 0   OLANZapine (ZYPREXA) 20 MG tablet Take 1 tablet (20 mg total) by mouth at bedtime. 30 tablet 0   ondansetron (ZOFRAN-ODT) 4 MG disintegrating tablet Take 1 tablet (4 mg total) by mouth every 8 (eight) hours as needed for nausea or vomiting. 15 tablet 0    Psychiatric Specialty Exam: Presentation  General Appearance:  Appropriate for Environment  Eye Contact: Good  Speech: Clear and Coherent  Speech Volume: Normal  Handedness: Right   Mood and Affect  Mood: Euphoric  Affect: Appropriate   Thought Process  Thought Processes: Coherent; Goal Directed  Descriptions of Associations:Intact  Orientation:Full (Time, Place and Person)  Thought Content:Logical  History of Schizophrenia/Schizoaffective disorder: Yes  Duration of Psychotic Symptoms:<6 months Hallucinations:Hallucinations:Visual, " people are demons and part human"   Ideas of Reference:Other (comment) (believes people are partial demons and partial human)  Suicidal Thoughts:Suicidal Thoughts: No  Homicidal Thoughts:Homicidal Thoughts: No   Sensorium  Memory: Immediate Good;  Recent Good; Remote Good  Judgment: Poor  Insight: Poor   Executive Functions  Concentration: Fair  Attention Span: Fair  Recall: Good  Fund of Knowledge: Good  Language: Good   Psychomotor Activity  Psychomotor Activity:No data recorded  Assets  Assets: Desire for Improvement; Social Support; Manufacturing systems engineer; Physical Health    Sleep  Sleep: Sleep: Poor (Not sleeping over the last several days)   Physical Exam: Physical Exam Constitutional:      Appearance: Normal appearance.  HENT:     Head: Normocephalic and atraumatic.  Eyes:     Extraocular Movements: Extraocular movements intact.     Pupils: Pupils are equal, round, and reactive to light.  Cardiovascular:     Rate and Rhythm: Normal rate and regular rhythm.  Pulmonary:     Effort: Pulmonary effort is normal.     Breath sounds: Normal breath sounds.  Musculoskeletal:     Cervical back: Normal range of motion and neck supple.  Skin:    General: Skin is warm.       Neurological:     General: No focal deficit present.     Mental Status: She is alert.    Review of Systems  Endo/Heme/Allergies:  Bruises/bleeds easily.  Psychiatric/Behavioral:  Positive for hallucinations and substance abuse. Negative for depression and suicidal ideas.    Blood pressure (!) 144/112, pulse 99, temperature 98.5 F (36.9 C), temperature source Oral, resp. rate 18, SpO2 100 %. There is no height or weight on file to calculate BMI.  Medical Decision Making: Patient case review and discussed with Dr. Viviano Ashley, patient meets inpatient psychiatric  does meet inpatient criteria for inpatient psychiatric treatment. Patient is unable to reliably contract for safety at this time. There is currently no inpatient bed availability at [facility]  LCSW notified and will be faxing patient out. EDP, RN, LCSW, notified of disposition.   Problem 1: Acute exacerbation of chronic Schizoaffective Disorder.  Restart Olanzapine 10  mg daily at bedtime and Prozac 20 mg daily For acute agitation, Ativan 2 mg every 8 hours PRN for anxiety, agitation, and insomnia. For insomnia , Trazodone 50 mg  QHS PRN for sleep.   Disposition: Recommend psychiatric Inpatient admission.   Joaquin Courts, NP 09/23/2022 4:30 PM

## 2022-09-23 NOTE — ED Notes (Signed)
ED Provider at bedside. 

## 2022-09-23 NOTE — ED Notes (Signed)
Placed patient's necklace and ring in specimen cup with patient's label on cup. Placed in locker 43.

## 2022-09-24 NOTE — ED Notes (Addendum)
Pt is up at window talking to the [facility] at this time, pt is stating that she doesn't feel good and that she is going from feeling hot to feeling cold.  Pt is waiting to use the phone at 10 am.

## 2022-09-24 NOTE — Progress Notes (Signed)
LCSW Progress Note  [id number]   Samantha Ashley  09/24/2022  11:58 AM  Description:   Inpatient Psychiatric Referral  Patient was recommended inpatient per [facility], PMHNP. There are no available beds at [facility], per [facility] Malva Limes, RN. Patient was referred to the following out of network facilities:   Destination  Service Provider Address Phone Fax  [facility] [address] [place] [address] [phone]  [facility]  [address], [place] [address] [phone]  [facility]  [address] [place] [address] [phone]  [facility]  [phone] [address] [place] [address] [phone]  [facility]  [address] [place], [place] [address] [phone]  [facility]  [address] [place] [address] [phone]  [facility]  [address] [place] [address] [phone]  [facility]  [address] [place] [address] [phone]  [facility]  [address] [place] [address] [phone]  [facility]  [address] [place] [address] [phone]  [facility]  [address], [place] [address] [phone]  [facility]  [address] [place] [address] [phone]  [facility]  [address] [place] [address] [phone]  [facility]  [address], [place] [address] [phone]  [facility]  [address] [place] [address] [phone]  [facility]  [address] [place] [address] [phone]  [facility]  [address] [place], [place] [address] [phone]  [facility]  [address], [place] [address] [phone]  [facility]  [address] [place] [address] [phone]  [facility]  [address], [place] [address] [phone]  [facility]  [address] [place], [place] [address] [phone]  [facility]  [address] [place] [address] [phone]  [facility]  [address] [place] [address] [phone]  [facility]  [address] [place] [address] [phone]  [facility]  [address] [place] [address] [phone]  [facility]  [address] [place] [address] [phone]    Situation ongoing, CSW to continue following and update chart as more information becomes available.      [name], LCSW  09/24/2022 11:58 AM

## 2022-09-24 NOTE — ED Notes (Signed)
Pt is standing at the front window at this time. Pt asked to use the phone to speak to her wife. Pt was informed that phone calls start at 10 am and that she would be able to use the phone then.

## 2022-09-24 NOTE — ED Notes (Signed)
Pt is sitting in room coloring and working on word searches.

## 2022-09-24 NOTE — ED Provider Notes (Signed)
Emergency Medicine Observation Re-evaluation Note  Samantha Ashley is a 27 y.o. female, seen on rounds today.  Pt initially presented to the ED for complaints of IVC Currently, the patient is resting comfortably.  Physical Exam  BP (!) 124/96 (BP Location: Left Arm)   Pulse 80   Temp (!) 97.5 F (36.4 C) (Oral)   Resp 20   SpO2 100%  Physical Exam General: Resting comfortably Cardiac: Regular rate Lungs: Breathing comfortably Psych: calm  ED Course / MDM  EKG:EKG Interpretation  Date/Time:  Tuesday Sep 22 2022 20:05:44 EDT Ventricular Rate:  96 PR Interval:  142 QRS Duration: 87 QT Interval:  383 QTC Calculation: 484 R Axis:   112 Text Interpretation: Right and left arm electrode reversal, interpretation assumes no reversal Sinus rhythm LAE, consider biatrial enlargement Right axis deviation Borderline Q waves in lateral leads Abnormal T, consider ischemia, lateral leads Confirmed by Alona Bene [phone]) on 09/22/2022 8:07:47 PM  I have reviewed the labs performed to date as well as medications administered while in observation.  Recent changes in the last 24 hours include none.  Plan  Current plan is for inpatient psych.    Mardene Sayer, MD 09/24/22 [phone]

## 2022-09-24 NOTE — Progress Notes (Addendum)
LCSW Progress Note  [id number]   Samantha Ashley  09/24/2022  12:09 AM    Inpatient Behavioral Health Placement  Pt meets inpatient criteria per Joaquin Courts, NP. There are no available beds within [facility] system per [facility], RN. Referral was sent to the following facilities;   Destination  Service Provider Address Phone Fax  [facility] [address] [place] [address] [phone]  [facility]  [address] [place] [address] [phone]  [facility]-Ad[facility]  [address] [place] [address] [phone]  [facility]  [phone] [address] [place] [address] [phone]  [facility]  [address] [place], [place] [address] [phone]  [facility]  [address] [place] [address] [phone]  [facility]  [address] [place] [address] [phone]  [facility]  [address], [place] [address] [phone]  [facility]  [address], [place] [address] [phone]  [facility]  [address], [place] [address] [phone]  [facility]  [address], [place] [address] [phone]  [facility]  [address] [place] [address] [phone]  [facility]  [address] [place] [address] [phone]  [facility]  [address] [place] [address] [phone]  [facility]  [address] [place] [address] [phone]  [facility]  [address]  [place] [address] [phone]  [facility]  [address] [place], [place] [address] [phone]  [facility]  [address], [place] [address] [phone]  [facility]  [address] [place] [address] [phone]  [facility]  [address], [place] [address] [phone]  [facility]  [address] [place], [place] [address] [phone]  [facility]  [address], [place] [address] [phone]  [facility]  [address] [place] [address] [phone]  [facility]  [address], [place] [address] [phone]  [facility]  [address] [place] [address] [phone]  [facility]  [address] [place] [address] [phone]    Situation ongoing,  CSW will follow up.    [name], MSW, LCSWA 09/24/2022 12:09 AM
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
## 2022-09-25 NOTE — ED Notes (Signed)
Patient has called her mother and is aggressively talking to her    she is demanding her mother remove the IVC   she is blaming her mother for everything wrong with her  she said she was an adult and can do what she wants  she is threatening her mother saying she will take her down when she gets out of here   she starts preaching to her stating she will see the wraft of God

## 2022-09-25 NOTE — Progress Notes (Signed)
LCSW Progress Note  [id number]   Samantha Ashley  09/25/2022  2:01 PM  Description:   Inpatient Psychiatric Referral  Patient was recommended inpatient per [facility], PMHNP. There are [facility] beds at [facility], per [facility], RN. Patient was referred to the following out of network facilities:   Destination  Service Provider Address Phone Fax  [facility] [address] [place] [address] [phone]  [facility]  [address], [place] [address] [phone]  [facility]  [address] [place] [address] [phone]  [facility]  [phone] [address] [place] [address] [phone]  [facility]  [address] [place], [place] [address] [phone]  [facility]  [address] [place] [address] [phone]  [facility]  [address] [place] [address] [phone]  [facility]  [address], [place] [address] [phone]  [facility]  [address], [place] [address] [phone]  [facility]  [address], [place] [address] [phone]  [facility]  [address], [place] [address] [phone]  [facility]  [address], [place] [address] [phone]  [facility]  [address] [place] [address] [phone]  [facility]  [address], [place] [address] [phone]  [facility]  [address] [place] [address] [phone]  [facility]  [address], [place] [address] [phone]  [facility]  [address] [place], [place] [address] [phone]  [facility]  [address] [place] [address] [phone]  [facility]  [address] [place] [address] [phone]  [facility]  [address], [place] [address] [phone]  [facility]  [address] [place], [place] [address] [phone]  [facility]  [address] [place] [address] [phone]  [facility]  [address] [place] [address] [phone]  [facility]  [address], [place] [address] [phone]  [facility]  [address] [place] [address] [phone]  [facility]  [address] [place] [address] [phone]    Situation ongoing, CSW to continue following and update chart as more information becomes available.      [name], LCSW  09/25/2022 2:01 PM

## 2022-09-25 NOTE — ED Provider Notes (Signed)
Emergency Medicine Observation Re-evaluation Note  Samantha Ashley is a 27 y.o. female, seen on rounds today.  Pt initially presented to the ED for complaints of IVC Currently, the patient is alert, cooperative, talking with staff. No new c/o this AM.   Physical Exam  BP 133/89 (BP Location: Left Arm)   Pulse 84   Temp 97.8 F (36.6 C) (Oral)   Resp 18   SpO2 100%  Physical Exam General: calm, conversing w staff.  Cardiac: regular rate.  Lungs: breathing comfortably. Psych: calm, not actively responding to internal stimuli.   ED Course / MDM    I have reviewed the labs performed to date as well as medications administered while in observation.  Recent changes in the last 24 hours include ED obs, BH reassessment, med management.   Plan  BH team working on inpatient [facility] placement.   The patient has been placed in psychiatric observation due to the need to provide a safe environment for the patient while obtaining psychiatric consultation and evaluation, as well as ongoing medical and medication management to treat the patient's condition.     Cathren Laine, MD 09/25/22 [phone]

## 2022-09-25 NOTE — ED Notes (Signed)
Patient has been restless throughout the night. She will come to the nurses station and say random things. " Im a real gangsta" " God is good" " Can wind be violent?"   Patient got upset at 2300 when another patient had to be medicated. She was easily calmed down and returned to her room. Patient has been calm and cooperative but her comments sporadic.

## 2022-09-25 NOTE — Progress Notes (Signed)
[facility] Psych ED Progress Note  09/25/2022 5:47 PM Samantha Ashley  MRN:  [id number]   Principal Problem: Schizoaffective disorder, bipolar type (HCC) Diagnosis:  Principal Problem:   Schizoaffective disorder, bipolar type (HCC) Active Problems:   Marijuana abuse   ED Assessment Time Calculation: Start Time: 0930 Stop Time: 0950 Total Time in Minutes (Assessment Completion): 20   Subjective: Samantha Ashley is a 27 y.o. female with a history of schizoaffective d/o bipolar type.   On evaluation today, the patient is pacing up and down the hall in the [place] area. She is calm and cooperative during this assessment.  She begins to tell provider that she is overwhelmed, overstimulated, and triggered.  She states that she is under attack by something that she cannot see or touch.  She states that since she said the word Love, she has been attacked by angels and demons.  Patient passively endorses SI, but had "I am toxic to myself " then she begins pointing at her chest and arms where she has tattoos, stating these are toxic you should never do this to your body, talking about her tattoos.  Patient denies HI, but when asked about auditory or visual hallucinations she states I hear my own inner voice, saying negative things such as, I will never be anything, I am stupid and that things are not fine.  She then goes on to say about visual hallucinations that she does not see things that are not there, but she sees the person she can look at a person in their eyes and see their soul. Patient reports that she recognizes that she is still somewhat manic, and wants to do any type of outpatient therapy, but she is wanting to go home.  She states that she has been in partial hospitalization before and she would like to do it again, she is ready to go home.  She states that she lives with her Samantha Ashley, and that they have a good relationship.  She says that she is compliant with medication, and her last  psychiatric inpatient admission was in 2022. Patient states that she is sleeping okay and her appetite is fair. No indication that she is responding to internal stimuli during this assessment. She denies suicidal ideations.  She denies homicidal ideations.    [place] Scale:  Flowsheet Row ED from [facility] in [facility] ED from [facility]/26/2021 [facility] ED from 05/29/2021 in [facility] RISK CATEGORY High Risk No Risk No Risk       Past Medical History:  Past Medical History:  Diagnosis Date   Asthma    History reviewed. No pertinent surgical history. Family History:  Family History  Problem Relation Age of Onset   Healthy Mother    Healthy Father     Social History:  Social History   Substance and Sexual Activity  Alcohol Use Not Currently     Social History   Substance and Sexual Activity  Drug Use Yes   Types: Marijuana    Social History   Socioeconomic History   Marital status: Married    Spouse name: Not on file   Number of children: Not on file   Years of education: Not on file   Highest education level: Not on file  Occupational History   Not on file  Tobacco Use   Smoking status: Every Day    Types: Cigars  Smokeless tobacco: Never  Substance and Sexual Activity   Alcohol use: Not Currently   Drug use: Yes    Types: Marijuana   Sexual activity: Yes    Birth control/protection: Pill  Other Topics Concern   Not on file  Social History Narrative   Not on file   Social Determinants of Health   Financial Resource Strain: Not on file  Food Insecurity: Not on file  Transportation Needs: Not on file  Physical Activity: Not on file  Stress: Not on file  Social Connections: Not on file    Sleep: Poor  Appetite:  Fair  Current Medications: Current Facility-Administered Medications  Medication Dose Route Frequency Provider Last Rate Last Admin    acetaminophen (TYLENOL) tablet 650 mg  650 mg Oral Q4H PRN Long, Arlyss Repress, MD   650 mg at 09/25/22 1312   alum & mag hydroxide-simeth (MAALOX/MYLANTA) 200-200-20 MG/5ML suspension 30 mL  30 mL Oral Q6H PRN Long, Arlyss Repress, MD       FLUoxetine (PROZAC) capsule 20 mg  20 mg Oral Daily Long, Arlyss Repress, MD   20 mg at 09/25/22 0932   LORazepam (ATIVAN) tablet 2 mg  2 mg Oral Q8H PRN Bing Neighbors, NP   2 mg at 09/25/22 0932   mupirocin cream (BACTROBAN) 2 %   Topical BID Bing Neighbors, NP   Given at 09/25/22 0941   nicotine (NICODERM CQ - dosed in mg/24 hours) patch 21 mg  21 mg Transdermal Daily Long, Arlyss Repress, MD   21 mg at 09/22/22 2232   OLANZapine (ZYPREXA) tablet 10 mg  10 mg Oral QHS Long, Arlyss Repress, MD   10 mg at 09/24/22 2008   ondansetron (ZOFRAN) tablet 4 mg  4 mg Oral Q8H PRN Long, Arlyss Repress, MD       potassium chloride SA (KLOR-CON M) CR tablet 40 mEq  40 mEq Oral Daily Long, Arlyss Repress, MD   40 mEq at 09/25/22 0932   traZODone (DESYREL) tablet 50 mg  50 mg Oral QHS PRN Bing Neighbors, NP   50 mg at 09/24/22 2008   Current Outpatient Medications  Medication Sig Dispense Refill   acetaminophen (TYLENOL) 500 MG tablet Take 2 tablets (1,000 mg total) by mouth every 6 (six) hours as needed. (Patient taking differently: Take 1,000 mg by mouth every 6 (six) hours as needed for mild pain.) 30 tablet 0   albuterol (VENTOLIN HFA) 108 (90 Base) MCG/ACT inhaler Inhale 2 puffs into the lungs every 6 (six) hours as needed for wheezing or shortness of breath.     FLUoxetine (PROZAC) 20 MG capsule Take 20 mg by mouth daily.     fluticasone (FLONASE) 50 MCG/ACT nasal spray Place 1 spray into both nostrils daily. 9.9 mL 0   ibuprofen (ADVIL) 600 MG tablet Take 1 tablet (600 mg total) by mouth every 6 (six) hours as needed. (Patient taking differently: Take 600 mg by mouth every 6 (six) hours as needed for mild pain.) 30 tablet 0   JUNEL FE 1.5/30 1.5-30 MG-MCG tablet Take 1 tablet by mouth at  bedtime.     OLANZapine (ZYPREXA) 20 MG tablet Take 1 tablet (20 mg total) by mouth at bedtime. 30 tablet 0   ondansetron (ZOFRAN-ODT) 4 MG disintegrating tablet Take 1 tablet (4 mg total) by mouth every 8 (eight) hours as needed for nausea or vomiting. 15 tablet 0   metFORMIN (GLUCOPHAGE) 500 MG tablet Take 1 tablet (500 mg total) by  mouth 2 (two) times daily. (Patient not taking: Reported on 09/23/2022) 60 tablet 0    Lab Results: No results found for this or any previous visit (from the past 48 hour(s)).  Blood Alcohol level:  Lab Results  Component Value Date   ETH <10 09/22/2022   ETH <10 11/07/2020    Physical Findings:  CIWA:    COWS:     Musculoskeletal: Strength & Muscle Tone: within normal limits Gait & Station: normal Patient leans: N/A  Psychiatric Specialty Exam:  Presentation  General Appearance:  Appropriate for Environment  Eye Contact: Good  Speech: Clear and Coherent  Speech Volume: Normal  Handedness: Right   Mood and Affect  Mood: Euthymic  Affect: Appropriate   Thought Process  Thought Processes: Coherent; Disorganized  Descriptions of Associations:Loose  Orientation:Full (Time, Place and Person)  Thought Content:Illogical; Scattered  History of Schizophrenia/Schizoaffective disorder:No data recorded Duration of Psychotic Symptoms:No data recorded Hallucinations:Hallucinations: None  Ideas of Reference:Delusions  Suicidal Thoughts:Suicidal Thoughts: No  Homicidal Thoughts:Homicidal Thoughts: No   Sensorium  Memory: Immediate Fair; Recent Fair  Judgment: Fair  Insight: Fair   Art therapist  Concentration: Fair  Attention Span: Fair  Recall: Good  Fund of Knowledge: Good  Language: Good   Psychomotor Activity  Psychomotor Activity: Psychomotor Activity: Normal   Assets  Assets: Communication Skills; Desire for Improvement; Social Support   Sleep  Sleep: Sleep: Fair    Physical  Exam: Physical Exam Vitals and nursing note reviewed. Exam conducted with a chaperone present.  Neurological:     Mental Status: She is alert.  Psychiatric:        Attention and Perception: Attention normal.        Mood and Affect: Mood normal. Affect is flat.        Speech: Speech normal.        Behavior: Behavior is cooperative.        Thought Content: Thought content is delusional.        Cognition and Memory: Memory normal.        Judgment: Judgment is inappropriate.    Review of Systems  Constitutional: Negative.   Psychiatric/Behavioral:  The patient has insomnia.        Delusional    Blood pressure (!) 135/56, pulse 77, temperature (!) 97.5 F (36.4 C), temperature source Oral, resp. rate 18, SpO2 99 %. There is no height or weight on file to calculate BMI.    Medical Decision Making: Patient continues to require inpatient Psychiatric hospitalization. Patient remains IVC, she is compliant with medications. Patient has been faxed out by TTS SW to several inpatient psychiatric facility.   Coti Burd MOTLEY-MANGRUM, PMHNP 09/25/2022, 5:47 PM

## 2022-09-25 NOTE — ED Notes (Signed)
Patient to room 30 .  Patient ambulated to room. Patient oriented to room and unit.

## 2022-09-26 ENCOUNTER — Encounter (HOSPITAL_COMMUNITY): Payer: Self-pay | Admitting: Psychiatry

## 2022-09-26 MED ORDER — HALOPERIDOL 5 MG PO TABS
5.0000 mg | ORAL_TABLET | Freq: Once | ORAL | Status: AC
  Filled 2022-09-26: qty 1

## 2022-09-26 MED ORDER — HALOPERIDOL LACTATE 5 MG/ML IJ SOLN
5.0000 mg | Freq: Once | INTRAMUSCULAR | Status: AC
  Administered 2022-09-26: 5 mg via INTRAMUSCULAR
  Filled 2022-09-26: qty 1

## 2022-09-26 MED ORDER — LORAZEPAM 2 MG/ML IJ SOLN: 2.0000 mg | Freq: Once | INTRAMUSCULAR | Status: AC

## 2022-09-26 MED ORDER — DIPHENHYDRAMINE HCL 25 MG PO CAPS
50.0000 mg | ORAL_CAPSULE | Freq: Once | ORAL | Status: AC
  Filled 2022-09-26: qty 2

## 2022-09-26 MED ORDER — LORAZEPAM 2 MG/ML PO CONC
2.0000 mg | Freq: Once | ORAL | Status: DC
  Filled 2022-09-26: qty 1

## 2022-09-26 MED ORDER — LORAZEPAM 2 MG/ML IJ SOLN
INTRAMUSCULAR | Status: AC
  Administered 2022-09-26: 2 mg via INTRAMUSCULAR
  Filled 2022-09-26: qty 1

## 2022-09-26 MED ORDER — LORAZEPAM 2 MG/ML IJ SOLN: 2.0000 mg | Freq: Once | INTRAMUSCULAR | Status: DC

## 2022-09-26 MED ORDER — DIPHENHYDRAMINE HCL 50 MG/ML IJ SOLN
50.0000 mg | Freq: Once | INTRAMUSCULAR | Status: AC
  Administered 2022-09-26: 50 mg via INTRAMUSCULAR
  Filled 2022-09-26: qty 1

## 2022-09-26 MED ORDER — LORAZEPAM 1 MG PO TABS
2.0000 mg | ORAL_TABLET | Freq: Once | ORAL | Status: DC
  Filled 2022-09-26 (×2): qty 2

## 2022-09-26 MED ORDER — OLANZAPINE 10 MG IM SOLR: 10.0000 mg | Freq: Once | INTRAMUSCULAR | Status: DC

## 2022-09-26 NOTE — ED Notes (Signed)
Pt parents are asking for phone call.

## 2022-09-26 NOTE — ED Notes (Signed)
Patient assisted to call her wife.  Informed of phone privilege and length of phone call.  Understanding voiced

## 2022-09-26 NOTE — ED Notes (Signed)
Wife's number added to patient contact per request

## 2022-09-26 NOTE — ED Notes (Signed)
PRN Ativan to be given at this time per MD P Messick. Pt aggressive, verbally and physically towards staff. Agreeable to take PO meds at this time.

## 2022-09-26 NOTE — ED Notes (Signed)
Pt agitated, not listening to staff, and coming out of room. Pt yelling at staff. Alona Bene, PMHNP at bedside to speak with patient.

## 2022-09-26 NOTE — ED Provider Notes (Signed)
Emergency Medicine Observation Re-evaluation Note  Samantha Ashley is a 27 y.o. female, seen on rounds today.  Pt initially presented to the ED for complaints of IVC Currently, the patient is resting.  Physical Exam  BP 126/81 (BP Location: Left Arm)   Pulse 70   Temp (!) 97.5 F (36.4 C) (Oral)   Resp 16   SpO2 100%  Physical Exam General: NAD   ED Course / MDM  EKG:EKG Interpretation  Date/Time:  Tuesday Sep 22 2022 20:05:44 EDT Ventricular Rate:  96 PR Interval:  142 QRS Duration: 87 QT Interval:  383 QTC Calculation: 484 R Axis:   112 Text Interpretation: Right and left arm electrode reversal, interpretation assumes no reversal Sinus rhythm LAE, consider biatrial enlargement Right axis deviation Borderline Q waves in lateral leads Abnormal T, consider ischemia, lateral leads Confirmed by Alona Bene [phone]) on 09/22/2022 8:07:47 PM  I have reviewed the labs performed to date as well as medications administered while in observation.  Recent changes in the last 24 hours include no acute events reported.  Plan  Current plan is for psych placement.    Wynetta Fines, MD 09/26/22 [phone]

## 2022-09-26 NOTE — Progress Notes (Addendum)
There are no aviable beds at [facility] Antoinette Cillo, RN. Pt was sent to out of network providers:  Destination  Service Provider Address Phone Fax  [facility] [address] [place] [address] [phone]  [facility]  [address] [place] [address] [phone]  [facility]  [address] [place] [address] [phone]  [facility]  [phone] [place] [address] [phone]  [facility]  [address] [place], [place] [address] [phone]  [facility]  [address] [place] [address] [phone]  [facility]  [address] [place] [address] [phone]  [facility]  [address] [place] [address] [phone]  [facility]  [address] [place] [address] [phone]  [facility]  [address] [place] [address] [phone]  [facility]  [address], [place] [address] [phone]  [facility]  [address], [place] [address] [phone]  [facility]  [address] [place] [address] [phone]  [facility]  [address], [place] [address] [phone]  [facility]  [address], [place] [address] [phone]  [facility]  [address], [place] [address] [phone]  [facility]  [address] [place], [place] [address] [phone]  [facility]  [address] [place]  [address] [phone]  [facility]  [address] [place] [address] [phone]  [facility]  [address], [place] [address] [phone]  [facility]  [address] [place], [place] [address] [phone]  [facility]  [address], [place] [address] [phone]  [facility]  [address] [place] [address] [phone]  [facility]  [address] [place] [address] [phone]  [facility]  [address], [place] [address] [phone]  [facility]  [address] [place] [address] [phone]  [facility]  [address], [place] [address] [phone]  [facility]  [address] [place] [address] [phone]  [facility]  [address] [place] [address] [phone]  [facility]  [address], [place] [address] [phone]  [facility]  [address], [place] [address] [phone]  [facility]  [address], [place] [address] [phone]  [facility]  [address] [place], [place] [address] [phone]   [name], MSW, [facility] 09/26/2022 6:08 PM

## 2022-09-26 NOTE — Progress Notes (Signed)
[facility] Psych ED Progress Note  09/26/2022 12:23 PM Samantha Ashley  MRN:  [id number]   Subjective: Samantha Ashley is a 27 year old African-American female with a pertinent past psychiatric history of schizoaffective disorder bipolar type and marijuana abuse, who initially presented this encounter via EMS and police from home due to bizarre, erratic, and psychotic behavior. Patient was IVC'd.   Patient seen today for face-to-face reevaluation at [facility] emergency department.  Upon evaluation, patient presents with continued delusional themes of religiosity, mildly elevated and irritable interpersonal style, and endorsements of hearing "God's voice".  Patient during our engagement is very discharge focused, shares that she believes that being on her medications while here in the emergency department is enough, and that continued hospitalization will just make things worse, describing that behaviorally she feels she can not control herself as well while being hospitalized.  Patient reports no suicidal or homicidal ideations, but does make reference to difficulties with a specific person, an old friend of hers, outside of the hospital that she had a "falling out with."  Patient reports that she is tolerating her medications well, no appreciable side effects.  Patient denies any paranoid ideations and or ideas of reference.  Discussed with patient calling her Sister Samantha Ashley, whom she states that she lives with, as well as reaching out to her mother for additional information, the patient was amenable to.  Collateral Samantha Ashley, Mother) [phone]  Phone call placed to patient's mother and was able to provide additional information.  Upon conversation with mother, patient mother reports that her and the patient's father do not feel comfortable with the patient returning home at this time to stay with them, as well as the patient's sister Samantha Ashley whom the patient lives with is out of town and unavailable to  receive the patient.  Mother shares her concerns are that the patient is extremely "triggered" by her recent falling out in her relationship with a friend of hers named Samantha Ashley, who reportedly recently has made several allegations against the patient and filed PRO paperwork against her, which in combination with the patient's medication noncompliance, has caused the patient to severely decompensate.  Collateral Samantha Ashley, Sister)- Unable to be reached at this time, will attempt tomorrow   Principal Problem: Schizoaffective disorder, bipolar type (HCC) Diagnosis:  Principal Problem:   Schizoaffective disorder, bipolar type (HCC) Active Problems:   Marijuana abuse   ED Assessment Time Calculation: Start Time: 1100 Stop Time: 1130 Total Time in Minutes (Assessment Completion): 30   Past Psychiatric History:   [place] Scale:  Flowsheet Row ED from 09/22/20[facility] in [facility] ED from 06/26/2021 in [facility] ED from [facility]/29/2021 in [facility] RISK CATEGORY High Risk No Risk No Risk       Past Medical History:  Past Medical History:  Diagnosis Date   Asthma    History reviewed. No pertinent surgical history. Family History:  Family History  Problem Relation Age of Onset   Healthy Mother    Healthy Father    Social History:  Social History   Substance and Sexual Activity  Alcohol Use Not Currently     Social History   Substance and Sexual Activity  Drug Use Yes   Types: Marijuana    Social History   Socioeconomic History   Marital status: Married    Spouse name: Not on file   Number of children: Not on file  Years of education: Not on file   Highest education level: Not on file  Occupational History   Not on file  Tobacco Use   Smoking status: Every Day    Types: Cigars   Smokeless tobacco: Never  Substance and Sexual Activity   Alcohol use: Not Currently   Drug  use: Yes    Types: Marijuana   Sexual activity: Yes    Birth control/protection: Pill  Other Topics Concern   Not on file  Social History Narrative   Not on file   Social Determinants of Health   Financial Resource Strain: Not on file  Food Insecurity: Not on file  Transportation Needs: Not on file  Physical Activity: Not on file  Stress: Not on file  Social Connections: Not on file    Sleep: Fair  Appetite:  Good  Current Medications: Current Facility-Administered Medications  Medication Dose Route Frequency Provider Last Rate Last Admin   acetaminophen (TYLENOL) tablet 650 mg  650 mg Oral Q4H PRN Long, Arlyss Repress, MD   650 mg at 09/25/22 1312   alum & mag hydroxide-simeth (MAALOX/MYLANTA) 200-200-20 MG/5ML suspension 30 mL  30 mL Oral Q6H PRN Long, Arlyss Repress, MD       FLUoxetine (PROZAC) capsule 20 mg  20 mg Oral Daily Long, Arlyss Repress, MD   20 mg at 09/26/22 1018   LORazepam (ATIVAN) tablet 2 mg  2 mg Oral Q8H PRN Bing Neighbors, NP   2 mg at 09/26/22 0849   mupirocin cream (BACTROBAN) 2 %   Topical BID Bing Neighbors, NP   Given at 09/25/22 0941   nicotine (NICODERM CQ - dosed in mg/24 hours) patch 21 mg  21 mg Transdermal Daily Long, Arlyss Repress, MD   21 mg at 09/22/22 2232   OLANZapine (ZYPREXA) tablet 10 mg  10 mg Oral QHS Long, Arlyss Repress, MD   10 mg at 09/25/22 2135   ondansetron (ZOFRAN) tablet 4 mg  4 mg Oral Q8H PRN Long, Arlyss Repress, MD   4 mg at 09/26/22 1610   potassium chloride SA (KLOR-CON M) CR tablet 40 mEq  40 mEq Oral Daily Long, Arlyss Repress, MD   40 mEq at 09/26/22 1018   traZODone (DESYREL) tablet 50 mg  50 mg Oral QHS PRN Bing Neighbors, NP   50 mg at 09/25/22 2135   Current Outpatient Medications  Medication Sig Dispense Refill   acetaminophen (TYLENOL) 500 MG tablet Take 2 tablets (1,000 mg total) by mouth every 6 (six) hours as needed. (Patient taking differently: Take 1,000 mg by mouth every 6 (six) hours as needed for mild pain.) 30 tablet 0    albuterol (VENTOLIN HFA) 108 (90 Base) MCG/ACT inhaler Inhale 2 puffs into the lungs every 6 (six) hours as needed for wheezing or shortness of breath.     FLUoxetine (PROZAC) 20 MG capsule Take 20 mg by mouth daily.     fluticasone (FLONASE) 50 MCG/ACT nasal spray Place 1 spray into both nostrils daily. 9.9 mL 0   ibuprofen (ADVIL) 600 MG tablet Take 1 tablet (600 mg total) by mouth every 6 (six) hours as needed. (Patient taking differently: Take 600 mg by mouth every 6 (six) hours as needed for mild pain.) 30 tablet 0   JUNEL FE 1.5/30 1.5-30 MG-MCG tablet Take 1 tablet by mouth at bedtime.     OLANZapine (ZYPREXA) 20 MG tablet Take 1 tablet (20 mg total) by mouth at bedtime. 30 tablet 0  ondansetron (ZOFRAN-ODT) 4 MG disintegrating tablet Take 1 tablet (4 mg total) by mouth every 8 (eight) hours as needed for nausea or vomiting. 15 tablet 0   metFORMIN (GLUCOPHAGE) 500 MG tablet Take 1 tablet (500 mg total) by mouth 2 (two) times daily. (Patient not taking: Reported on 09/23/2022) 60 tablet 0    Lab Results: No results found for this or any previous visit (from the past 48 hour(s)).  Blood Alcohol level:  Lab Results  Component Value Date   ETH <10 09/22/2022   ETH <10 11/07/2020    Physical Findings:  CIWA:    COWS:       Psychiatric Specialty Exam:  Presentation  General Appearance:  Appropriate for Environment  Eye Contact: Other (comment) (Variable to good)  Speech: Clear and Coherent  Speech Volume: Normal  Handedness: Right   Mood and Affect  Mood: -- (Mildly elevated, irritable edge)  Affect: Other (comment) (Congruent)   Thought Process  Thought Processes: Goal Directed; Other (comment) (Circumstantial to tangential with mild loosening of associations)  Descriptions of Associations:Loose  Orientation:Full (Time, Place and Person)  Thought Content:Other (comment); Delusions; Illogical (Delusional themes of religiousity)  History of  Schizophrenia/Schizoaffective disorder:Yes  Duration of Psychotic Symptoms:Less than six months  Hallucinations:Hallucinations: Auditory Description of Auditory Hallucinations: Reports hearing voices from God  Ideas of Reference:Delusions  Suicidal Thoughts:Suicidal Thoughts: No  Homicidal Thoughts:Homicidal Thoughts: No (Does endorse problems with a women named "[place]")   Sensorium  Memory: Immediate Fair; Recent Fair; Remote Fair  Judgment: Fair  Insight: Fair   Chartered certified accountant: Fair  Attention Span: Fair  Recall: [employer] of Knowledge: Fair  Language: Fair   Psychomotor Activity  Psychomotor Activity: Psychomotor Activity: Normal   Assets  Assets: Social Support; Manufacturing systems engineer; Desire for Improvement; Transportation; Housing; Physical Health; Resilience   Sleep  Sleep: Sleep: Fair    Physical Exam: Physical Exam Vitals and nursing note reviewed.  Pulmonary:     Effort: Pulmonary effort is normal.  Neurological:     Mental Status: She is alert.  Psychiatric:        Attention and Perception: She perceives auditory hallucinations.        Thought Content: Thought content is delusional. Thought content does not include homicidal or suicidal ideation.        Judgment: Judgment is impulsive.    Review of Systems  Psychiatric/Behavioral:  Positive for hallucinations. Negative for suicidal ideas.   All other systems reviewed and are negative.  Blood pressure 134/86, pulse 84, temperature 97.9 F (36.6 C), temperature source Axillary, resp. rate 18, SpO2 100 %. There is no height or weight on file to calculate BMI.   Medical Decision Making:  Patient at this time continues to meet inpatient criteria. Will continue to work with TTS in finding inpatient beds availability, as well as following up with the patient daily until disposition is obtained. I have reviewed nursing notes.    #Schizoaffective disorder,  bipolar type (HCC)  -Continue current medication regimen -Continue dispo planning for inpatient stay   Lenox Ponds, NP 09/26/2022, 12:23 PM

## 2022-09-27 ENCOUNTER — Encounter (HOSPITAL_COMMUNITY): Payer: Self-pay | Admitting: Psychiatry

## 2022-09-27 MED ORDER — OLANZAPINE 5 MG PO TABS
15.0000 mg | ORAL_TABLET | Freq: Every day | ORAL | Status: DC
  Administered 2022-09-27 – 2022-09-28 (×2): 15 mg via ORAL
  Filled 2022-09-27 (×2): qty 1

## 2022-09-27 NOTE — ED Provider Notes (Signed)
Emergency Medicine Observation Re-evaluation Note  Samantha Ashley is a 27 y.o. female, seen on rounds today.  Pt initially presented to the ED for complaints of IVC Currently, the patient is asleep.  Physical Exam  BP (!) 137/93 (BP Location: Left Arm)   Pulse 97   Temp 97.6 F (36.4 C) (Oral)   Resp 15   SpO2 100%  Physical Exam General: asleep Cardiac: asleep Lungs: asleep Psych: asleep  ED Course / MDM  EKG:EKG Interpretation  Date/Time:  Tuesday Sep 22 2022 20:05:44 EDT Ventricular Rate:  96 PR Interval:  142 QRS Duration: 87 QT Interval:  383 QTC Calculation: 484 R Axis:   112 Text Interpretation: Right and left arm electrode reversal, interpretation assumes no reversal Sinus rhythm LAE, consider biatrial enlargement Right axis deviation Borderline Q waves in lateral leads Abnormal T, consider ischemia, lateral leads Confirmed by Samantha Ashley [phone] on 09/22/2022 8:07:47 PM  I have reviewed the labs performed to date as well as medications administered while in observation.  Recent changes in the last 24 hours include ativan and benadryl yesterday.  Plan  Current plan is for inpatient psychiatric treatment.    Samantha Loveless, MD 09/27/22 1131

## 2022-09-27 NOTE — Progress Notes (Addendum)
[facility] ED Progress Note  09/27/2022 2:28 PM Samantha Ashley  MRN:  [id number]   Subjective:  Samantha Ashley is a 27 year old African-American female with a pertinent past psychiatric history of schizoaffective disorder bipolar type and marijuana abuse, who initially presented this encounter via EMS and police from home due to bizarre, erratic, and psychotic behavior. Patient was IVC'd.   Patient seen today for face-to-face reevaluation at [facility] emergency department.  Patient today endorses that so far today she is doing better, reports that her mood is "great".  Patient endorses that she continues to hear God's voice and appreciably expresses through conversation casually, delusional themes of religiosity.  Patient denies any visual hallucinations, denies suicidal ideations, denies homicidal ideations.  Discussed today provider team's concerns about leaving the hospital and remaining safe.  Patient reports that she feels that she is discharged ready, and that her psychosocial stressors revolving around her ex friend of hers [name] are not a problem, and that she "does not have any problem with her".  Patient overall presents expansive and elevated and her interpersonal style, with increased speech that is notably not pressured, and thought process that is circumstantial to tangential with loosening of associations.  Discussed behavioral event that happened yesterday where patient required IM medications, as well as discussed the plan going forward to make adjustments to medications to improve stabilization, patient verbalized she was amenable to this.  Principal Problem: Schizoaffective disorder, bipolar type (HCC) Diagnosis:  Principal Problem:   Schizoaffective disorder, bipolar type (HCC) Active Problems:   Marijuana abuse   ED Assessment Time Calculation: Start Time: 1200 Stop Time: 1230 Total Time in Minutes (Assessment Completion): 30   Past Psychiatric History:   [place]  Scale:  Flowsheet Row ED from 09/22/2022 in [facility] Department at [facility] ED from 06/26/2021 in [facility] ED from 05/29/2021 in [facility] at [facility] RISK CATEGORY High Risk No Risk No Risk       Past Medical History:  Past Medical History:  Diagnosis Date   Asthma    History reviewed. No pertinent surgical history. Family History:  Family History  Problem Relation Age of Onset   Healthy Mother    Healthy Father    Social History:  Social History   Substance and Sexual Activity  Alcohol Use Not Currently     Social History   Substance and Sexual Activity  Drug Use Yes   Types: Marijuana    Social History   Socioeconomic History   Marital status: Married    Spouse name: Not on file   Number of children: Not on file   Years of education: Not on file   Highest education level: Not on file  Occupational History   Not on file  Tobacco Use   Smoking status: Every Day    Types: Cigars   Smokeless tobacco: Never  Substance and Sexual Activity   Alcohol use: Not Currently   Drug use: Yes    Types: Marijuana   Sexual activity: Yes    Birth control/protection: Pill  Other Topics Concern   Not on file  Social History Narrative   Not on file   Social Determinants of Health   Financial Resource Strain: Not on file  Food Insecurity: Not on file  Transportation Needs: Not on file  Physical Activity: Not on file  Stress: Not on file  Social Connections: Not on file    Sleep: Good  Appetite:  Good  Current Medications: Current Facility-Administered Medications  Medication Dose Route Frequency Provider Last Rate Last Admin   acetaminophen (TYLENOL) tablet 650 mg  650 mg Oral Q4H PRN Long, Arlyss Repress, MD   650 mg at 09/25/22 1312   alum & mag hydroxide-simeth (MAALOX/MYLANTA) 200-200-20 MG/5ML suspension 30 mL  30 mL Oral Q6H PRN Long, Arlyss Repress, MD       FLUoxetine (PROZAC) capsule 20 mg  20  mg Oral Daily Long, Arlyss Repress, MD   20 mg at 09/27/22 0914   LORazepam (ATIVAN) tablet 2 mg  2 mg Oral Q8H PRN Bing Neighbors, NP   2 mg at 09/27/22 0914   LORazepam (ATIVAN) tablet 2 mg  2 mg Oral Once Lenox Ponds, NP       nicotine (NICODERM CQ - dosed in mg/24 hours) patch 21 mg  21 mg Transdermal Daily Long, Arlyss Repress, MD   21 mg at 09/22/22 2232   OLANZapine (ZYPREXA) tablet 10 mg  10 mg Oral QHS Long, Arlyss Repress, MD   10 mg at 09/26/22 2115   ondansetron (ZOFRAN) tablet 4 mg  4 mg Oral Q8H PRN Long, Arlyss Repress, MD   4 mg at 09/26/22 1610   traZODone (DESYREL) tablet 50 mg  50 mg Oral QHS PRN Bing Neighbors, NP   50 mg at 09/26/22 2115   Current Outpatient Medications  Medication Sig Dispense Refill   acetaminophen (TYLENOL) 500 MG tablet Take 2 tablets (1,000 mg total) by mouth every 6 (six) hours as needed. (Patient taking differently: Take 1,000 mg by mouth every 6 (six) hours as needed for mild pain.) 30 tablet 0   albuterol (VENTOLIN HFA) 108 (90 Base) MCG/ACT inhaler Inhale 2 puffs into the lungs every 6 (six) hours as needed for wheezing or shortness of breath.     FLUoxetine (PROZAC) 20 MG capsule Take 20 mg by mouth daily.     fluticasone (FLONASE) 50 MCG/ACT nasal spray Place 1 spray into both nostrils daily. 9.9 mL 0   ibuprofen (ADVIL) 600 MG tablet Take 1 tablet (600 mg total) by mouth every 6 (six) hours as needed. (Patient taking differently: Take 600 mg by mouth every 6 (six) hours as needed for mild pain.) 30 tablet 0   JUNEL FE 1.5/30 1.5-30 MG-MCG tablet Take 1 tablet by mouth at bedtime.     OLANZapine (ZYPREXA) 20 MG tablet Take 1 tablet (20 mg total) by mouth at bedtime. 30 tablet 0   ondansetron (ZOFRAN-ODT) 4 MG disintegrating tablet Take 1 tablet (4 mg total) by mouth every 8 (eight) hours as needed for nausea or vomiting. 15 tablet 0   metFORMIN (GLUCOPHAGE) 500 MG tablet Take 1 tablet (500 mg total) by mouth 2 (two) times daily. (Patient not taking: Reported  on 09/23/2022) 60 tablet 0    Lab Results: No results found for this or any previous visit (from the past 48 hour(s)).  Blood Alcohol level:  Lab Results  Component Value Date   [facility] <10 09/22/2022   ETH <10 11/07/2020    Physical Findings:  CIWA:    COWS:      Psychiatric Specialty Exam:  Presentation  General Appearance:  Fairly Groomed; Other (comment) (Expansive, atypical, and elevated interpersonal style)  Eye Contact: Other (comment) (Animated)  Speech: Clear and Coherent; Other (comment) (Increased amount, but not pressured)  Speech Volume: Normal  Handedness: Right   Mood and Affect  Mood: -- (Expansive)  Affect: Other (comment) (  Oddly bright)   Thought Process  Thought Processes: Other (comment) (Circumstantial to tangential with loosening of associations)  Descriptions of Associations:Loose  Orientation:Full (Time, Place and Person)  Thought Content:Other (comment); Delusions; Tangential (Delusional themes of religiosity)  History of Schizophrenia/Schizoaffective disorder:Yes  Duration of Psychotic Symptoms:Less than six months  Hallucinations:Hallucinations: Auditory Description of Auditory Hallucinations: Reports hearing voices from God  Ideas of Reference:Delusions  Suicidal Thoughts:Suicidal Thoughts: No  Homicidal Thoughts:Homicidal Thoughts: No   Sensorium  Memory: Immediate Fair; Recent Fair; Remote Fair  Judgment: Fair  Insight: Fair   Art therapist  Concentration: Fair  Attention Span: Fair  Recall: [employer] of Knowledge: Fair  Language: Fair   Psychomotor Activity  Psychomotor Activity: Psychomotor Activity: Other (comment) (Mildly elevated)   Assets  Assets: Social Support; Manufacturing systems engineer; Financial Resources/Insurance; Housing; Physical Health; Resilience   Sleep  Sleep: Sleep: Fair    Physical Exam: Physical Exam Vitals and nursing note reviewed.  Pulmonary:      Effort: Pulmonary effort is normal.  Neurological:     Mental Status: She is alert.  Psychiatric:        Mood and Affect: Affect is labile.        Speech: Speech is tangential.        Behavior: Behavior is hyperactive. Behavior is cooperative.        Thought Content: Thought content is delusional. Thought content is not paranoid. Thought content does not include homicidal or suicidal ideation.    Review of Systems  Constitutional: Negative.    Blood pressure (!) 123/92, pulse 89, temperature 98.1 F (36.7 C), temperature source Oral, resp. rate 20, SpO2 100 %. There is no height or weight on file to calculate BMI.   Medical Decision Making:  Patient at this time continues to meet inpatient criteria; continues to present with delusional themes of religiosity, increased but notably not pressured speech, circumstantial to tangential in her thought process with loosening of associations, and an elevated and expansive interpersonal style.  Discussed today given the patient's presentation increasing the patient's olanzapine to 15 mg nightly that patient was amenable to.  Will continue to work with TTS and finding inpatient bed availability, as well as follow the patient daily until disposition is obtained.  I have reviewed nursing notes.  Discussed today additionally augmentation of the patient's treatment plan with a mood stabilizer. Patient was not amenable to this at this time, but will continue to give encouragement.   #Schizoaffective disorder, bipolar type (HCC)  -Continue current medication regimen but with increase in Zyprexa from 10mg  QHS---> to 15mg  QHS -Continue dispo planning for inpatient stay -Consider augmentation of treatment plan with mood stabilizer   Lenox Ponds, NP 09/27/2022, 2:28 PM

## 2022-09-27 NOTE — ED Notes (Addendum)
0957: Pt is very anxious and tearful, states she is having a panic attack. Pt responded well to verbal de-escalation and is beginning to calm down.   1015: Pt made first phone call of the day, was calm and compliant with phone rules.

## 2022-09-27 NOTE — ED Notes (Signed)
Patient has been able to be calm this afternoon.

## 2022-09-27 NOTE — Progress Notes (Signed)
LCSW Progress Note  [id number]   Samantha Ashley  09/27/2022  4:23 PM    Inpatient Behavioral Health Placement  Pt meets inpatient criteria per Shearon Stalls. There are no available beds within [facility] per [facility] Antoinette Cillo, RN . Referral was sent to the following facilities;    Destination  Service Provider Address Phone Fax  [facility] [address] [place] [address] [phone]  [facility]  [address] [place] [address] [phone]  [facility]  [address] [place] [address] [phone]  [facility]  [phone] [address] [place] [address] [phone]  [facility]  [address] [place], [place] [address] [phone]  [facility]  [address] [place] [address] [phone]  [facility]  [address] [place] [address] [phone]  [facility]  [address] [place] [address] [phone]  [facility]  [address], [place] [address] [phone]  [facility]  [address], [place] [address] [phone]  [facility]  [address], [place] [address] [phone]  [facility]  [address] [place] [address] [phone]  [facility]  [address] [place] [address] [phone]  [facility]  [address] [place] [address] [phone]  [facility]  [address], [place] [address] [phone]  [facility]  [address],  [place] [address] [phone]  [facility]  [address] [place], [place] [address] [phone]  [facility]  [address], [place] [address] [phone]  [facility]  [address] [place] [address] [phone]  [facility]  [address] [place] [address] [phone]  [facility]  [address] [place], [place] [address] [phone]  [facility]  [address] [place] [address] [phone]  [facility]  [address] [place] [address] [phone]  [facility]  [address], [place] [address] [phone]  [facility]  [address], [place] [address] [phone]  [facility]  [address] [place] [address] [phone]  [facility]  [address], [place] [address] [phone]  [facility]  [address] [place] [address] [phone]  [facility]  [address], [place] [address] [phone]  [facility]  [address] [place] [address] [phone]  [facility]  [address] [place] [address] [phone]  [facility]  [address] [place] [address] [phone]  [facility]  [address] [place], [place] [address] [phone]  [facility]  [address], [place] [address] [phone]  [facility]  [address], [place] [address]  [phone]    Situation ongoing,  CSW will follow up.    [name], MSW, [facility] 09/27/2022 4:23 PM

## 2022-09-27 NOTE — ED Notes (Signed)
Patient labile with emotions.  Patient needs attention.  Hyper verbal and religious at times.

## 2022-09-27 NOTE — ED Notes (Signed)
Pt woke up and is very excitable, talking continuously, making loud (occasionally aggressive) remarks toward staff. Requiring frequent redirection to keep pt in her room and behavior from escalating.

## 2022-09-27 NOTE — ED Notes (Signed)
Patient has been cooperative with no aggression or agitation this afternoon.

## 2022-09-27 NOTE — ED Notes (Signed)
Pt has had a total of three phone calls today.

## 2022-09-27 NOTE — ED Notes (Signed)
Call received from pt sister Berdie Ogren [phone] requesting rtn call for pt status/updates. [employer]

## 2022-09-28 DIAGNOSIS — F250 Schizoaffective disorder, bipolar type: Secondary | ICD-10-CM

## 2022-09-28 LAB — BASIC METABOLIC PANEL
Anion gap: 8 (ref 5–15)
BUN: 6 mg/dL (ref 6–20)
CO2: 23 mmol/L (ref 22–32)
Calcium: 9.4 mg/dL (ref 8.9–10.3)
Chloride: 107 mmol/L (ref 98–111)
Creatinine, Ser: 1.03 mg/dL — ABNORMAL HIGH (ref 0.44–1.00)
GFR, Estimated: >60 mL/min (ref >60)
Glucose, Bld: 99 mg/dL (ref 70–99)
Potassium: 4 mmol/L (ref 3.5–5.1)
Sodium: 138 mmol/L (ref 135–145)

## 2022-09-28 MED ORDER — HALOPERIDOL 5 MG PO TABS
5.0000 mg | ORAL_TABLET | ORAL | Status: DC
  Administered 2022-09-28: 5 mg via ORAL
  Filled 2022-09-28: qty 1

## 2022-09-28 MED ORDER — HALOPERIDOL LACTATE 5 MG/ML IJ SOLN: 5.0000 mg | INTRAMUSCULAR | Status: DC

## 2022-09-28 MED ORDER — DIPHENHYDRAMINE HCL 50 MG/ML IJ SOLN
50.0000 mg | Freq: Once | INTRAMUSCULAR | Status: AC
  Administered 2022-09-28: 50 mg via INTRAMUSCULAR
  Filled 2022-09-28: qty 1

## 2022-09-28 NOTE — ED Notes (Signed)
Pt does not want her parents to receive any update about her and she does not wish to speak to them at this time.  Samantha Ashley and Samantha Ashley.

## 2022-09-28 NOTE — ED Provider Notes (Signed)
Emergency Medicine Observation Re-evaluation Note  Samantha Ashley is a 27 y.o. female, seen on rounds today.  Pt initially presented to the ED for complaints of IVC Currently, the patient is calm standing in room.  Physical Exam  BP 122/79 (BP Location: Left Arm)   Pulse 78   Temp 98 F (36.7 C) (Oral)   Resp 18   SpO2 100%  Physical Exam General: calm standing in room Cardiac: regular rate Lungs: Breathing comfortably Psych: Calm not agitated  ED Course / MDM  EKG:EKG Interpretation  Date/Time:  Tuesday Sep 22 2022 20:05:44 EDT Ventricular Rate:  96 PR Interval:  142 QRS Duration: 87 QT Interval:  383 QTC Calculation: 484 R Axis:   112 Text Interpretation: Right and left arm electrode reversal, interpretation assumes no reversal Sinus rhythm LAE, consider biatrial enlargement Right axis deviation Borderline Q waves in lateral leads Abnormal T, consider ischemia, lateral leads Confirmed by Alona Bene [phone]) on 09/22/2022 8:07:47 PM  I have reviewed the labs performed to date as well as medications administered while in observation.  Recent changes in the last 24 hours include addition of scheduled Ativan has kept patient calm per nursing staff.  Plan  Current plan is for inpatient psychiatric treatment.    Mardene Sayer, MD 09/28/22 [phone]

## 2022-09-28 NOTE — Progress Notes (Signed)
[facility] Psych ED Progress Note  09/28/2022 3:20 PM Samantha Ashley  MRN:  [id number]   Principal Problem: Schizoaffective disorder, bipolar type (HCC) Diagnosis:  Principal Problem:   Schizoaffective disorder, bipolar type (HCC) Active Problems:   Marijuana abuse   ED Assessment Time Calculation: Start Time: 1200 Stop Time: 1230 Total Time in Minutes (Assessment Completion): 30   Subjective:  Patient seen today for face-to-face reevaluation at [facility] emergency department. Patient today endorses that so far today she is feeling "ok". On evaluation, patient is sitting on the side of her bed, in no acute distress. She is calm and cooperative during this assessment. Her appearance is appropriate for environment. Her eye contact is good.  Speech is clear and coherent, normal pace and normal volume. Her mood appears to be euthymic, but at times she says she endorses having feelings of  sadness, due to being in [facility], affect congruent with mood. Thought process is coherent.  Thought content is slightly tangential at times.  Patient notes that she continues to hear God's voice, states "I am a questionable himself.  I want him to release his spirit of depression.  Provider asked what that meant, she stated that she wanted everyone to feel what she was feeling due to having schizoaffective disorder, bipolar type, feels that no one believes what she is saying, and that no one wants her to get better.  She states that she is ready to be discharged, she is focused on not being able to work, stating that she has bills to pay, and she feels that being in here is not doing her any good. She denies auditory and visual hallucinations.  No indication that she is responding to internal stimuli during this assessment. She denies suicidal ideations.  She denies homicidal ideations. Appetite and sleep are good.  Patient able to contract for safety.  Patient has not demonstrated any anger or aggression in 24 hours, and  has not had to have any IM medications for agitation or aggression.  Attempted to call patient's Sister Keviona Kennebeck at [phone].  This is the sister that patient and mother states that patient lives with.  No answer left a HIPAA compliant voicemail. .  Past Psychiatric History: Schizoaffective disorder, bipolar type (HCC)   [place] Scale:  Flowsheet Row ED from 09/22/2022 in [facility] Emergency Department at [facility] ED from 06[facility]26/2021 in [facility] at [facility] ED from 05/29/2021 in [facility] at [facility] RISK CATEGORY High Risk No Risk No Risk       Past Medical History:  Past Medical History:  Diagnosis Date   Asthma    History reviewed. No pertinent surgical history. Family History:  Family History  Problem Relation Age of Onset   Healthy Mother    Healthy Father     Social History:  Social History   Substance and Sexual Activity  Alcohol Use Not Currently     Social History   Substance and Sexual Activity  Drug Use Yes   Types: Marijuana    Social History   Socioeconomic History   Marital status: Married    Spouse name: Not on file   Number of children: Not on file   Years of education: Not on file   Highest education level: Not on file  Occupational History   Not on file  Tobacco Use   Smoking status: Every Day    Types: Cigars   Smokeless tobacco: Never  Substance and  Sexual Activity   Alcohol use: Not Currently   Drug use: Yes    Types: Marijuana   Sexual activity: Yes    Birth control/protection: Pill  Other Topics Concern   Not on file  Social History Narrative   Not on file   Social Determinants of Health   Financial Resource Strain: Not on file  Food Insecurity: Not on file  Transportation Needs: Not on file  Physical Activity: Not on file  Stress: Not on file  Social Connections: Not on file    Sleep: Good  Appetite:  Good  Current Medications: Current Facility-Administered  Medications  Medication Dose Route Frequency Provider Last Rate Last Admin   acetaminophen (TYLENOL) tablet 650 mg  650 mg Oral Q4H PRN Long, Arlyss Repress, MD   650 mg at 09/25/22 1312   alum & mag hydroxide-simeth (MAALOX/MYLANTA) 200-200-20 MG/5ML suspension 30 mL  30 mL Oral Q6H PRN Long, Arlyss Repress, MD       FLUoxetine (PROZAC) capsule 20 mg  20 mg Oral Daily Long, Arlyss Repress, MD   20 mg at 09/28/22 1119   LORazepam (ATIVAN) tablet 2 mg  2 mg Oral Q8H PRN Bing Neighbors, NP   2 mg at 09/28/22 1500   LORazepam (ATIVAN) tablet 2 mg  2 mg Oral Once Lenox Ponds, NP       nicotine (NICODERM CQ - dosed in mg/24 hours) patch 21 mg  21 mg Transdermal Daily Long, Arlyss Repress, MD   21 mg at 09/22/22 2232   OLANZapine (ZYPREXA) tablet 15 mg  15 mg Oral QHS Lenox Ponds, NP   15 mg at 09/27/22 2130   ondansetron (ZOFRAN) tablet 4 mg  4 mg Oral Q8H PRN Long, Arlyss Repress, MD   4 mg at 09/26/22 6213   traZODone (DESYREL) tablet 50 mg  50 mg Oral QHS PRN Bing Neighbors, NP   50 mg at 09/26/22 2115   Current Outpatient Medications  Medication Sig Dispense Refill   acetaminophen (TYLENOL) 500 MG tablet Take 2 tablets (1,000 mg total) by mouth every 6 (six) hours as needed. (Patient taking differently: Take 1,000 mg by mouth every 6 (six) hours as needed for mild pain.) 30 tablet 0   albuterol (VENTOLIN HFA) 108 (90 Base) MCG/ACT inhaler Inhale 2 puffs into the lungs every 6 (six) hours as needed for wheezing or shortness of breath.     FLUoxetine (PROZAC) 20 MG capsule Take 20 mg by mouth daily.     fluticasone (FLONASE) 50 MCG/ACT nasal spray Place 1 spray into both nostrils daily. 9.9 mL 0   ibuprofen (ADVIL) 600 MG tablet Take 1 tablet (600 mg total) by mouth every 6 (six) hours as needed. (Patient taking differently: Take 600 mg by mouth every 6 (six) hours as needed for mild pain.) 30 tablet 0   JUNEL FE 1.5/30 1.5-30 MG-MCG tablet Take 1 tablet by mouth at bedtime.     OLANZapine (ZYPREXA) 20 MG  tablet Take 1 tablet (20 mg total) by mouth at bedtime. 30 tablet 0   ondansetron (ZOFRAN-ODT) 4 MG disintegrating tablet Take 1 tablet (4 mg total) by mouth every 8 (eight) hours as needed for nausea or vomiting. 15 tablet 0   metFORMIN (GLUCOPHAGE) 500 MG tablet Take 1 tablet (500 mg total) by mouth 2 (two) times daily. (Patient not taking: Reported on 09/23/2022) 60 tablet 0    Lab Results: No results found for this or any previous visit (from the past  48 hour(s)).  Blood Alcohol level:  Lab Results  Component Value Date   ETH <10 09/22/2022   ETH <10 11/07/2020    Physical Findings:  CIWA:    COWS:     Musculoskeletal: Strength & Muscle Tone: within normal limits Gait & Station: normal Patient leans: N/A  Psychiatric Specialty Exam:  Presentation  General Appearance:  Fairly Groomed; Other (comment) (Expansive, atypical, and elevated interpersonal style)  Eye Contact: Other (comment) (Animated)  Speech: Clear and Coherent; Other (comment) (Increased amount, but not pressured)  Speech Volume: Normal  Handedness: Right   Mood and Affect  Mood: -- (Expansive)  Affect: Other (comment) (Oddly bright)   Thought Process  Thought Processes: Other (comment) (Circumstantial to tangential with loosening of associations)  Descriptions of Associations:Loose  Orientation:Full (Time, Place and Person)  Thought Content:Other (comment); Delusions; Tangential (Delusional themes of religiosity)  History of Schizophrenia/Schizoaffective disorder:Yes  Duration of Psychotic Symptoms:Less than six months  Hallucinations:Hallucinations: Auditory Description of Auditory Hallucinations: Reports hearing voices from God  Ideas of Reference:Delusions  Suicidal Thoughts:Suicidal Thoughts: No  Homicidal Thoughts:Homicidal Thoughts: No   Sensorium  Memory: Immediate Fair; Recent Fair; Remote Fair  Judgment: Fair  Insight: Fair   Art therapist   Concentration: Fair  Attention Span: Fair  Recall: [employer] of Knowledge: Fair  Language: Fair   Psychomotor Activity  Psychomotor Activity: Psychomotor Activity: Other (comment) (Mildly elevated)   Assets  Assets: Social Support; Manufacturing systems engineer; Financial Resources/Insurance; Housing; Physical Health; Resilience   Sleep  Sleep: Sleep: Fair    Physical Exam: Physical Exam Vitals and nursing note reviewed. Exam conducted with a chaperone present.  Musculoskeletal:        General: Normal range of motion.  Neurological:     Mental Status: She is alert.  Psychiatric:        Attention and Perception: Attention normal.        Mood and Affect: Mood normal.        Speech: Speech normal.        Behavior: Behavior is cooperative.        Thought Content: Thought content is delusional.        Cognition and Memory: Memory normal.        Judgment: Judgment normal.    Review of Systems  Constitutional: Negative.   Psychiatric/Behavioral:         Delusional at times about religion.   Blood pressure 126/88, pulse 97, temperature 98.6 F (37 C), temperature source Oral, resp. rate 18, SpO2 100 %. There is no height or weight on file to calculate BMI.    Medical Decision Making: Patient continues to require inpatient Psychiatric hospitalization.  Patient has not had any aggression or agitation in the past 24 hours, has not needed any IM medications for aggression or agitation.  Patient has been compliant with scheduled p.o. medications.  Patient continues to be faxed out to several inpatient psychiatric facilities.    Ibtisam Benge MOTLEY-MANGRUM, PMHNP 09/28/2022, 3:20 PM

## 2022-09-28 NOTE — ED Notes (Signed)
Pt is calm and cooperative at this time. Pt offered trazodone to help her sleep but states that she does not need it. Sitter remains at bedside. Pt provided sandwich and drink and states no further needs.

## 2022-09-28 NOTE — Progress Notes (Signed)
8:43 AM - Per Thurnell Garbe, RN, [facility] is requesting documents be sent for possible review this morning. CSW sent psych evaluation note, progress note, and IVC paperwork to [facility] for review.  [name], [place]  09/28/2022 8:44 AM

## 2022-09-28 NOTE — BH Assessment (Signed)
Secure chat received from RN Thurnell Garbe which states, "[facility] called and they are requesting the IVC paperwork, Psych eval and progress notes for this pt. They want to present her to the docs this morning for possible placement. The fax # is [phone]."   Clinician added disposition CSW [name] and CSW Staci Acosta to the chat, to be notified during shift.   Manfred Arch, MSW, LCSW Triage Specialist [phone]

## 2022-09-28 NOTE — ED Notes (Signed)
At around 1715 pt began yelling out that she was going to act out until she got someone's attention to help her.  She wants to leave. I have explained to her that she is waiting on placement and if none is found, she could possibly be sent home tomorrow, but not today.  Pt wants to speak with NP about same.  Pt slamming her room door several times and yelling at the staff.  Pt was re-directed several times.  Pt wants to use the phone.  Pt was told she would have to wait until it was returned to the department.  Pt finally went to her room after standing here singing and talking to herself.  Spoke with NP and she has told pt multiple times she is not leaving today.  NP placed medications in for pt to be given to help with agitation and acting out.

## 2022-09-28 NOTE — ED Notes (Signed)
Pt now giving permission to talk with her mother in the morning about her release.  She wants her mother called and the mother can get in-touch with the sister that pt lives with via phone on 3 way call or to give sister a number to call here for NP to speak with concerning living arraignments.

## 2022-09-28 NOTE — Progress Notes (Addendum)
This CSW spoke with pt's mother via phone Samantha Ashley [phone] to inquire about obtaining pt's insurance information to continue to seek inpatient behavioral health placement for pt.   Pt's mother shared that pt now has [place] [employer] effective 09/26/22. Pt's mother informed CSW that she does not have pt's insurance card because it is being mailed to pt's home address, and therefore pt's mother currently does not have access. Pt's mother shared that she spoke with pt today and is really impressed with pt's improvements. Pt's mother verbalized that pt has a strong support system and also shared that pt's sister who is her roommate is willing to continue to live with pt. Pt's mother believes that ps is ready to transition home and will not benefit from inpatient behavioral health placement at this time. CSW used active listening skills while pt's mother provided insight and overall background in regards to pt and pt's behavioral health. CSW inquired if pt has linkage to outpatient behavioral health resources and pt's mother informed that pt is linked to [facility] and also has an outpatient therapist that she sees through telehealth therapy. This CSW informed pt's mother that CSW would shared this information with Psych provider Samantha Ashley, PMHNP and informed that the final disposition would be provided by our Psych providers. Pt's mother verbalized understanding.  Care Team notified: Samantha Ashley, PMHNP, and CSW Disposition [name], LCSW   [name], MSW, [facility] 09/28/2022 11:05 PM

## 2022-09-28 NOTE — Progress Notes (Signed)
At 4:34pm this CSW called [facility] L. Mathews Argyle, LCAS [phone] via phone and was not successful with following up on referral. This CSW left a HIPAA complaint voicemail requesting a phone call back.  At 8:52pm This CSW attempted to contact [facility] with [facility] [phone], and left a HIPAA complaint requesting a call back.  CSW then called the main line with [facility] [phone] in the attempt to follow up with the referral. CSW was informed that there is no information on pt at this time. CSW was informed to follow back up with [facility] L. Elisabeth Most, LCSW, LCAS during 1st shift.  -CSW sent a secure email to Elisabeth Most, [facility] [email or web address] and add 1st shift Disposition for follow up.  Disposition CSW [place], LCSW to call Elisabeth Most, LCSW, LCAS in the morning before 9am.   [name], MSW, [facility] 09/28/2022 9:02 PM

## 2022-09-28 NOTE — Progress Notes (Addendum)
CSW called [facility] to follow up on referral and pt has ben denied due to NO pay source.   CSW called [facility] [phone] to follow up on referral and left a HIPAA complaint voicemail.  [name], MSW, [facility] 09/28/2022 9:07 PM

## 2022-09-28 NOTE — Progress Notes (Signed)
LCSW Progress Note  [id number]   Samantha Ashley  09/28/2022  12:29 PM  Description:   Inpatient Psychiatric Referral  Patient was recommended inpatient per [facility], PMHNP. There are no available beds at [facility], per [facility] Rona Ravens, RN. Patient was referred to the following out of network facilities:   Destination  Service Provider Address Phone Fax  [facility] [address] [place] [address] [phone]  [facility]  [address], [place] [address] [phone]  [facility]  [address] [place] [address] [phone]  [facility]  [phone] [address] [place] [address] [phone]  [facility]  [address] [place], [place] [address] [phone]  [facility]  [address] [place] [address] [phone]  [facility]  [address] [place] [address] [phone]  [facility]  [address], [place] [address] [phone]  [facility]  [address], [place] [address] [phone]  [facility]  [address], [place] [address] [phone]  [facility]  [address] [place] [address] [phone]  [facility]  [address] [place] [address] [phone]  [facility]  [address] [place] [address] [phone]  [facility]  [address] [place] [address] [phone]  [facility]  [address], [place] [address] [phone]  [facility]  [address] [place] [address] [phone]  [facility]  [address] [place], [place] [address] [phone]  [facility]  [address], [place] [address] [phone]  [facility]  [address] [place] [address] [phone]  [facility]  [address] [place] [address] [phone]  [facility]  [address] [place], [place] [address] [phone]  [facility]  [address], [place] [address] [phone]  [facility]  [address] [place] [address] [phone]  [facility]  [address] [place] [address] [phone]  [facility]  [address] [place] [address] [phone]  [facility]  [address] [place] [address] [phone]  [facility]  [address], [place] [address] [phone]  [facility]  [address] [place] [address] [phone]  [facility]  [address], [place] [address] [phone]  [facility]  [address], [place] [address] [phone]  [facility]  [address] [place] [address] [phone]  [facility]  [address] [place], [place] [address] [phone]  [facility]  [address] [place] [address] [phone]    Situation ongoing, CSW to continue following and update chart as more information becomes available.      [name], [place]  09/28/2022 12:29  PM

## 2022-09-29 MED ORDER — NICOTINE 21 MG/24HR TD PT24: 21.0000 mg | MEDICATED_PATCH | Freq: Every day | TRANSDERMAL | 0 refills | Status: AC

## 2022-09-29 MED ORDER — OLANZAPINE 15 MG PO TABS: 15.0000 mg | ORAL_TABLET | Freq: Every day | ORAL | 0 refills | Status: DC

## 2022-09-29 MED ORDER — TRAZODONE HCL 50 MG PO TABS: 50.0000 mg | ORAL_TABLET | Freq: Every evening | ORAL | 0 refills | Status: DC | PRN

## 2022-09-29 NOTE — ED Provider Notes (Signed)
Emergency Medicine Observation Re-evaluation Note  Samantha Ashley is a 27 y.o. female, seen on rounds today.  Pt initially presented to the ED for complaints of IVC Currently, the patient is resting comfortably.  Physical Exam  BP (!) 132/98 (BP Location: Left Arm)   Pulse 92   Temp 97.8 F (36.6 C) (Oral)   Resp 14   SpO2 100%  Physical Exam Vitals and nursing note reviewed.  Constitutional:      General: She is not in acute distress.    Appearance: She is well-developed.  HENT:     Head: Normocephalic and atraumatic.  Eyes:     Conjunctiva/sclera: Conjunctivae normal.  Cardiovascular:     Rate and Rhythm: Normal rate and regular rhythm.     Heart sounds: No murmur heard. Pulmonary:     Effort: Pulmonary effort is normal. No respiratory distress.  Musculoskeletal:        General: No swelling.     Cervical back: Neck supple.  Skin:    General: Skin is warm and dry.     Capillary Refill: Capillary refill takes less than 2 seconds.  Neurological:     Mental Status: She is alert.  Psychiatric:        Mood and Affect: Mood normal.      ED Course / MDM  EKG:EKG Interpretation  Date/Time:  Tuesday Sep 22 2022 20:05:44 EDT Ventricular Rate:  96 PR Interval:  142 QRS Duration: 87 QT Interval:  383 QTC Calculation: 484 R Axis:   112 Text Interpretation: Right and left arm electrode reversal, interpretation assumes no reversal Sinus rhythm LAE, consider biatrial enlargement Right axis deviation Borderline Q waves in lateral leads Abnormal T, consider ischemia, lateral leads Confirmed by Alona Bene [phone]) on 09/22/2022 8:07:47 PM  I have reviewed the labs performed to date as well as medications administered while in observation.  Recent changes in the last 24 hours include TTS evaluation and psychiatric clearance.  Patient is to be discharged today  Plan  Current plan is for discharge.    Glendora Score, MD 09/29/22 [phone]

## 2022-09-29 NOTE — Discharge Summary (Addendum)
[facility] Psych ED Discharge  09/29/2022 12:59 PM Samantha Ashley  MRN:  [id number]  Principal Problem: Schizoaffective disorder, bipolar type [facility]) Discharge Diagnoses: Principal Problem:   Schizoaffective disorder, bipolar type (HCC) Active Problems:   Marijuana abuse  Clinical Impression:  Final diagnoses:  Psychosis, unspecified psychosis type (HCC)   Subjective: Samantha Ashley is a 27 y.o. female patient admitted, under IVC petition, petitioned by mother, Samantha Ashley  for not taking care of ADLS, not taking her medications and not sleeping and was having A/V/H. Patient resumed her Olanzapine ar 15 mg and Prozac was also resumed.  Lab work was obtained and result reviewed.  Trazodone was added for sleep.  Patient was rounded on daily.  She was to be admitted in a Psychiatry unit but was not accepted by any hospital [facility].  Initially on arrival patient received few doses of Haldol and Benadryl for agitation and PRN Ativan was given.  Patient would yell, hit things  needing frequent redirecting.  She was religiously fixed stating she is talking to God.  As she continues to take her medications we noted some mood improvement. Mother spoke to daughter several times over the weekend and realized she has since improved.  Mother believes no need for inpatient hospitalization.  Mother requested discharge home since Patient has strong family support.  Patient lives with her sister and sees [name], Therapist, sports. Provider spoke with mother and arranged discharge plan.  Mother was take patient to her home pending when [name] her other daughter will move in with patient in her apartment.  Today patient denies SI/HI/.AVH.  Patient is alert, oriented x5 and she engages in coherent and meaningful conversation.  Patient agrees to be taking her Medications as prescribed.  We discussed the side effect of Olanzapine in reference to weight gain.  We discussed diet low in Carbohydrate, low in fat and to avoid fatty food.   We discussed exercise and daily walking.  Patient verbalizes Understanding.  Patient is Psychiatrically cleared.  ED Assessment Time Calculation: Start Time: 1000 Stop Time: 1032 Total Time in Minutes (Assessment Completion): 32   Past Psychiatric History: see initial Psychiatric evaluation note  Past Medical History:  Past Medical History:  Diagnosis Date   Asthma    History reviewed. No pertinent surgical history. Family History:  Family History  Problem Relation Age of Onset   Healthy Mother    Healthy Father    Family Psychiatric  History: see initial Psychiatry evaluation note Social History:  Social History   Substance and Sexual Activity  Alcohol Use Not Currently     Social History   Substance and Sexual Activity  Drug Use Yes   Types: Marijuana    Social History   Socioeconomic History   Marital status: Married    Spouse name: Not on file   Number of children: Not on file   Years of education: Not on file   Highest education level: Not on file  Occupational History   Not on file  Tobacco Use   Smoking status: Every Day    Types: Cigars   Smokeless tobacco: Never  Substance and Sexual Activity   Alcohol use: Not Currently   Drug use: Yes    Types: Marijuana   Sexual activity: Yes    Birth control/protection: Pill  Other Topics Concern   Not on file  Social History Narrative   Not on file   Social Determinants of Health   Financial Resource Strain: Not on file  Food Insecurity: Not on file  Transportation Needs: Not on file  Physical Activity: Not on file  Stress: Not on file  Social Connections: Not on file    Tobacco Cessation:  N/A, patient does not currently use tobacco products  Current Medications: Current Facility-Administered Medications  Medication Dose Route Frequency Provider Last Rate Last Admin   acetaminophen (TYLENOL) tablet 650 mg  650 mg Oral Q4H PRN Long, Arlyss Repress, MD   650 mg at 09/25/22 1312   alum & mag  hydroxide-simeth (MAALOX/MYLANTA) 200-200-20 MG/5ML suspension 30 mL  30 mL Oral Q6H PRN Long, Arlyss Repress, MD       FLUoxetine (PROZAC) capsule 20 mg  20 mg Oral Daily Long, Arlyss Repress, MD   20 mg at 09/29/22 0915   LORazepam (ATIVAN) tablet 2 mg  2 mg Oral Q8H PRN Bing Neighbors, NP   2 mg at 09/29/22 1204   nicotine (NICODERM CQ - dosed in mg/24 hours) patch 21 mg  21 mg Transdermal Daily Long, Arlyss Repress, MD   21 mg at 09/29/22 0915   OLANZapine (ZYPREXA) tablet 15 mg  15 mg Oral QHS Lenox Ponds, NP   15 mg at 09/28/22 2126   ondansetron (ZOFRAN) tablet 4 mg  4 mg Oral Q8H PRN Long, Arlyss Repress, MD   4 mg at 09/26/22 1610   traZODone (DESYREL) tablet 50 mg  50 mg Oral QHS PRN Bing Neighbors, NP   50 mg at 09/26/22 2115   Current Outpatient Medications  Medication Sig Dispense Refill   albuterol (VENTOLIN HFA) 108 (90 Base) MCG/ACT inhaler Inhale 2 puffs into the lungs every 6 (six) hours as needed for wheezing or shortness of breath.     FLUoxetine (PROZAC) 20 MG capsule Take 20 mg by mouth daily.     fluticasone (FLONASE) 50 MCG/ACT nasal spray Place 1 spray into both nostrils daily. 9.9 mL 0   JUNEL FE 1.5/30 1.5-30 MG-MCG tablet Take 1 tablet by mouth at bedtime.     [START ON 09/30/2022] nicotine (NICODERM CQ - DOSED IN MG/24 HOURS) 21 mg/24hr patch Place 1 patch (21 mg total) onto the skin daily. 28 patch 0   OLANZapine (ZYPREXA) 15 MG tablet Take 1 tablet (15 mg total) by mouth at bedtime. 30 tablet 0   traZODone (DESYREL) 50 MG tablet Take 1 tablet (50 mg total) by mouth at bedtime as needed for up to 15 days for sleep. 10 tablet 0   PTA Medications: (Not in a hospital admission)   [place] Scale:  Flowsheet Row ED from 09/22/2022 in [facility] at [facility] ED from 06/26/2021 in [facility] at [facility] ED from 05/29/2021 in [facility] at [facility] RISK CATEGORY High Risk No Risk No Risk        Musculoskeletal: Strength & Muscle Tone: within normal limits Gait & Station: normal Patient leans: Front  Psychiatric Specialty Exam: Presentation  General Appearance:  Neat; Fairly Groomed  Eye Contact: Good  Speech: Clear and Coherent; Normal Rate  Speech Volume: Normal  Handedness: Right   Mood and Affect  Mood: Euthymic  Affect: Congruent   Thought Process  Thought Processes: Coherent; Goal Directed; Linear  Descriptions of Associations:Intact  Orientation:Full (Time, Place and Person)  Thought Content:Logical  History of Schizophrenia/Schizoaffective disorder:Yes  Duration of Psychotic Symptoms:Less than six months  Hallucinations:Hallucinations: None  Ideas of Reference:None  Suicidal Thoughts:Suicidal Thoughts: No  Homicidal Thoughts:Homicidal Thoughts: No  Sensorium  Memory: Immediate Good  Judgment: Good  Insight: Good   Executive Functions  Concentration: Good  Attention Span: Good  Recall: Jennelle Human of Knowledge: Fair  Language: Good   Psychomotor Activity  Psychomotor Activity: Psychomotor Activity: Normal   Assets  Assets: Communication Skills; Desire for Improvement; Housing; Physical Health   Sleep  Sleep: Sleep: Fair    Physical Exam: Physical Exam Vitals and nursing note reviewed.  Constitutional:      Appearance: Normal appearance.  HENT:     Head: Normocephalic and atraumatic.     Nose: Nose normal.  Cardiovascular:     Rate and Rhythm: Normal rate and regular rhythm.  Pulmonary:     Effort: Pulmonary effort is normal.  Musculoskeletal:        General: Normal range of motion.     Cervical back: Normal range of motion.  Skin:    General: Skin is warm and dry.  Neurological:     General: No focal deficit present.     Mental Status: She is alert and oriented to person, place, and time.  Psychiatric:        Attention and Perception: Attention and perception normal.         Mood and Affect: Mood normal.        Speech: Speech normal.        Behavior: Behavior normal. Behavior is cooperative.        Thought Content: Thought content normal.        Cognition and Memory: Cognition and memory normal.        Judgment: Judgment normal.    Review of Systems  Constitutional:        Moderately obese  HENT: Negative.    Eyes: Negative.   Respiratory: Negative.    Cardiovascular: Negative.   Gastrointestinal: Negative.   Genitourinary: Negative.   Musculoskeletal: Negative.   Skin: Negative.   Neurological: Negative.   Endo/Heme/Allergies: Negative.   Psychiatric/Behavioral:  Positive for substance abuse.    Blood pressure (!) 132/98, pulse 92, temperature 97.8 F (36.6 C), temperature source Oral, resp. rate 14, SpO2 100 %. There is no height or weight on file to calculate BMI.   Demographic Factors:  Adolescent or young adult and Unemployed  Loss Factors: NA  Historical Factors: NA  Risk Reduction Factors:   Religious beliefs about death, Living with another person, especially a relative, and Positive social support  Continued Clinical Symptoms:  Bipolar Disorder:   Mixed State Alcohol/Substance Abuse/Dependencies More than one psychiatric diagnosis Previous Psychiatric Diagnoses and Treatments  Cognitive Features That Contribute To Risk:  None    Suicide Risk:  Minimal: No identifiable suicidal ideation.  Patients presenting with no risk factors but with morbid ruminations; may be classified as minimal risk based on the severity of the depressive symptoms   Follow-up Information     [facility]. Go to.   Specialty: Urgent Care Why: Please visit if you are experincing a behavioral health crisis. Call to schedule an appointment for outpatient behavioral health services such as therapy and medication managment. Contact information: [address] [place] [address] [phone]                 Plan Of Care/Follow-up recommendations:  Activity:  as tolerated Diet:  Low Carbohydrate and low fat diet  Medical Decision Making: Patient denies SI/HI/AVH and no mention of paranoia.  Patient has remained calm in the last 24 hours.  Patient sees  [name] in [place] as her outpatient Psychiatrist.  We discussed safety plan-call 911 or [phone] for any Mental health crisis including but not limited to Suicide ideation or thought.  Go to [facility] health facility if you cannot get early appointment with [name].  Patient was advised to avoid using Cannabis due to the effect on her overall being and her mental health diagnosis.  Patient is Psychiatrically cleared.  Disposition: Psychiatrically cleared. Earney Navy, NP-PMHNP-BC 09/29/2022, 12:59 PM

## 2022-09-29 NOTE — ED Notes (Signed)
Pt remains resting in bed with eyes closed. No distress noted. Sitter at bedside.

## 2022-09-29 NOTE — Progress Notes (Signed)
TOC consulted to provide outpatient MH resources. Resources provided and attached to pt's AVS. No further TOC needs.

## 2022-09-29 NOTE — ED Notes (Addendum)
Patient pacing back and forth in room, patient verbalized she is getting anxious awaiting her ride, patient willing to take prn ativan to relieve anxiety

## 2022-09-30 ENCOUNTER — Emergency Department (HOSPITAL_COMMUNITY)
Admission: EM | Admit: 2022-09-30 | Discharge: 2022-10-02 | Disposition: A | Discharge status: Other Acute Inpatient Hospital | Payer: Medicaid Other | Attending: Emergency Medicine | Admitting: Emergency Medicine

## 2022-09-30 ENCOUNTER — Encounter (HOSPITAL_COMMUNITY): Payer: Self-pay | Admitting: Emergency Medicine

## 2022-09-30 ENCOUNTER — Other Ambulatory Visit: Payer: Self-pay

## 2022-09-30 DIAGNOSIS — F250 Schizoaffective disorder, bipolar type: Secondary | ICD-10-CM | POA: Diagnosis present

## 2022-09-30 DIAGNOSIS — J45909 Unspecified asthma, uncomplicated: Secondary | ICD-10-CM | POA: Diagnosis not present

## 2022-09-30 DIAGNOSIS — Z7951 Long term (current) use of inhaled steroids: Secondary | ICD-10-CM | POA: Insufficient documentation

## 2022-09-30 DIAGNOSIS — F23 Brief psychotic disorder: Secondary | ICD-10-CM

## 2022-09-30 DIAGNOSIS — Z1152 Encounter for screening for COVID-19: Secondary | ICD-10-CM | POA: Insufficient documentation

## 2022-09-30 DIAGNOSIS — R000 Tachycardia, unspecified: Secondary | ICD-10-CM | POA: Diagnosis not present

## 2022-09-30 LAB — COMPREHENSIVE METABOLIC PANEL
ALT: 46 U/L — ABNORMAL HIGH (ref 0–44)
AST: 39 U/L (ref 15–41)
Albumin: 3.9 g/dL (ref 3.5–5.0)
Alkaline Phosphatase: 70 U/L (ref 38–126)
Anion gap: 10 (ref 5–15)
BUN: 7 mg/dL (ref 6–20)
CO2: 22 mmol/L (ref 22–32)
Calcium: 9 mg/dL (ref 8.9–10.3)
Chloride: 107 mmol/L (ref 98–111)
Creatinine, Ser: 1.09 mg/dL — ABNORMAL HIGH (ref 0.44–1.00)
GFR, Estimated: >60 mL/min (ref >60)
Glucose, Bld: 100 mg/dL — ABNORMAL HIGH (ref 70–99)
Potassium: 3.7 mmol/L (ref 3.5–5.1)
Sodium: 139 mmol/L (ref 135–145)
Total Bilirubin: 0.6 mg/dL (ref 0.3–1.2)
Total Protein: 8.3 g/dL — ABNORMAL HIGH (ref 6.5–8.1)

## 2022-09-30 LAB — RAPID URINE DRUG SCREEN, HOSP PERFORMED
Amphetamines: NOT DETECTED
Barbiturates: NOT DETECTED
Benzodiazepines: POSITIVE — AB
Cocaine: NOT DETECTED
Opiates: NOT DETECTED
Tetrahydrocannabinol: POSITIVE — AB

## 2022-09-30 LAB — CBC WITH DIFFERENTIAL/PLATELET
Abs Immature Granulocytes: 0.01 10*3/uL (ref 0.00–0.07)
Basophils Absolute: 0 10*3/uL (ref 0.0–0.1)
Basophils Relative: 1 %
Eosinophils Absolute: 0.1 10*3/uL (ref 0.0–0.5)
Eosinophils Relative: 1 %
HCT: 41.2 % (ref 36.0–46.0)
Hemoglobin: 13.3 g/dL (ref 12.0–15.0)
Immature Granulocytes: 0 %
Lymphocytes Relative: 46 %
Lymphs Abs: 2.9 10*3/uL (ref 0.7–4.0)
MCH: 27.1 pg (ref 26.0–34.0)
MCHC: 32.3 g/dL (ref 30.0–36.0)
MCV: 84.1 fL (ref 80.0–100.0)
Monocytes Absolute: 0.9 10*3/uL (ref 0.1–1.0)
Monocytes Relative: 14 %
Neutro Abs: 2.4 10*3/uL (ref 1.7–7.7)
Neutrophils Relative %: 38 %
Platelets: 279 10*3/uL (ref 150–400)
RBC: 4.9 MIL/uL (ref 3.87–5.11)
RDW: 17.8 % — ABNORMAL HIGH (ref 11.5–15.5)
WBC: 6.3 10*3/uL (ref 4.0–10.5)
nRBC: 0 % (ref 0.0–0.2)

## 2022-09-30 LAB — ETHANOL: Alcohol, Ethyl (B): <10 mg/dL (ref <10)

## 2022-09-30 LAB — SALICYLATE LEVEL: Salicylate Lvl: <7 mg/dL — ABNORMAL LOW (ref 7.0–30.0)

## 2022-09-30 LAB — I-STAT BETA HCG BLOOD, ED (MC, WL, AP ONLY): I-stat hCG, quantitative: <5 m[IU]/mL (ref <5)

## 2022-09-30 LAB — ACETAMINOPHEN LEVEL: Acetaminophen (Tylenol), Serum: <10 ug/mL — ABNORMAL LOW (ref 10–30)

## 2022-09-30 MED ORDER — LORAZEPAM 1 MG PO TABS
1.0000 mg | ORAL_TABLET | Freq: Once | ORAL | Status: AC
  Administered 2022-09-30: 1 mg via ORAL
  Filled 2022-09-30: qty 1

## 2022-09-30 NOTE — ED Notes (Signed)
Pt. One bag of belongings placed in cabinet 30.

## 2022-09-30 NOTE — ED Notes (Signed)
Pt wanded by security. 

## 2022-09-30 NOTE — ED Triage Notes (Signed)
[place] bib [employer] from [place] patient was found exhibiting bizarre and aggressive behavior. Endorses auditory hallucinations. IVC'ed by police.

## 2022-09-30 NOTE — ED Notes (Signed)
Pt dressed in burgundy scrubs and belongings placed in 23-25 cabinet.

## 2022-09-30 NOTE — ED Provider Notes (Signed)
[facility] EMERGENCY DEPARTMENT AT [facility] Provider Note   CSN: [id number] Arrival date & time: 09/30/22  2002     History  Chief Complaint  Patient presents with   Psychiatric Evaluation    Samantha Ashley is a 27 y.o. female with a past medical history significant for schizoaffective disorder, bipolar 1 disorder, and marijuana abuse who presents to the ED under IVC by [employer].  Per [employer] patient brought in from [place] where patient was exhibiting bizarre and aggressive behavior.  Patient admitted to [employer] that she was having auditory hallucinations however, denied any hallucinations during initial evaluation. Denies HI and SI. Patient with tangential speech.  Difficult to obtain HPI.  Patient evaluated by psychiatry yesterday and was cleared.  Admits to marijuana use.  No other drugs.  Denies alcohol and tobacco use.  No physical complaints.  History obtained from patient and past medical records. No interpreter used during encounter.       Home Medications Prior to Admission medications   Medication Sig Start Date End Date Taking? Authorizing Provider  albuterol (VENTOLIN HFA) 108 (90 Base) MCG/ACT inhaler Inhale 2 puffs into the lungs every 6 (six) hours as needed for wheezing or shortness of breath.    [provider]  FLUoxetine (PROZAC) 20 MG capsule Take 20 mg by mouth daily. 07/21/22   [provider]  fluticasone (FLONASE) 50 MCG/ACT nasal spray Place 1 spray into both nostrils daily. 08/27/20   Laveda Abbe, NP  JUNEL FE 1.5/30 1.5-30 MG-MCG tablet Take 1 tablet by mouth at bedtime.    [provider]  nicotine (NICODERM CQ - DOSED IN MG/24 HOURS) 21 mg/24hr patch Place 1 patch (21 mg total) onto the skin daily. 09/30/22   Earney Navy, NP  OLANZapine (ZYPREXA) 15 MG tablet Take 1 tablet (15 mg total) by mouth at bedtime. 09/29/22 10/29/22  Earney Navy, NP  traZODone (DESYREL) 50 MG tablet Take 1 tablet (50 mg total) by mouth  at bedtime as needed for up to 15 days for sleep. 09/29/22 10/14/22  Earney Navy, NP      Allergies    Coconut (cocos nucifera)    Review of Systems   Review of Systems  Psychiatric/Behavioral:  Positive for behavioral problems and hallucinations.     Physical Exam Updated Vital Signs BP (!) 134/118 (BP Location: Right Arm)   Pulse (!) 119   Temp 98.8 F (37.1 C) (Oral)   Resp 18   Ht 5\' 5"  (1.651 m)   Wt 113.4 kg   SpO2 100%   BMI 41.60 kg/m  Physical Exam Vitals and nursing note reviewed.  Constitutional:      General: She is not in acute distress.    Appearance: She is not ill-appearing.  HENT:     Head: Normocephalic.  Eyes:     Pupils: Pupils are equal, round, and reactive to light.  Cardiovascular:     Rate and Rhythm: Normal rate and regular rhythm.     Pulses: Normal pulses.     Heart sounds: Normal heart sounds. No murmur heard.    No friction rub. No gallop.  Pulmonary:     Effort: Pulmonary effort is normal.     Breath sounds: Normal breath sounds.  Abdominal:     General: Abdomen is flat. There is no distension.     Palpations: Abdomen is soft.     Tenderness: There is no abdominal tenderness. There is no guarding or rebound.  Musculoskeletal:        General: Normal range of motion.     Cervical back: Neck supple.  Skin:    General: Skin is warm and dry.  Neurological:     General: No focal deficit present.     Mental Status: She is alert.  Psychiatric:        Mood and Affect: Mood normal.        Speech: Speech is tangential.        Behavior: Behavior normal.        Thought Content: Thought content is paranoid.     ED Results / Procedures / Treatments   Labs (all labs ordered are listed, but only abnormal results are displayed) Labs Reviewed  CBC WITH DIFFERENTIAL/PLATELET - Abnormal; Notable for the following components:      Result Value   RDW 17.8 (*)    All other components within normal limits  COMPREHENSIVE METABOLIC PANEL   ETHANOL  RAPID URINE DRUG SCREEN, HOSP PERFORMED  SALICYLATE LEVEL  ACETAMINOPHEN LEVEL  I-STAT BETA HCG BLOOD, ED ([facility], [facility], [facility] ONLY)    EKG None  Radiology No results found.  Procedures Procedures    Medications Ordered in ED Medications  LORazepam (ATIVAN) tablet 1 mg (1 mg Oral Given 09/30/22 2045)    ED Course/ Medical Decision Making/ A&P                             Medical Decision Making Amount and/or Complexity of Data Reviewed Independent Historian:     Details: [employer] External Data Reviewed: notes. Labs: ordered. Decision-making details documented in ED Course.  Risk Prescription drug management.   This patient presents to the ED for concern of abnormal behavior, this involves an extensive number of treatment options, and is a complaint that carries with it a high risk of complications and morbidity.  The differential diagnosis includes schizoaffective disorder, metabolic derangement, etc  27 year old female with a history of schizoaffective disorder presents to the ED under IVC by [employer].  Patient exhibiting bizarre and aggressive behavior.  Seen yesterday and cleared by psychiatry.  Patient with tangential speech.  Difficult to obtain HPI.  Patient denies SI and HI however, when asked if she has thoughts of hurting herself she shows me a bruise on the left side of her chest and notes she hits her chest to mimic her heartbeat.  Denies any hallucinations to me however, admitted to auditory hallucinations to [employer].  Admits to marijuana use.  No physical complaints.  Upon arrival patient tachycardic.  EKG ordered.  Patient notes she feels extremely anxious.  Ativan given.  Medical clearance labs ordered. First examination performed.  CBC reassuring.  No leukocytosis.  Normal hemoglobin.  Pregnancy test negative.  EKG demonstrates sinus tachycardia heart rate 100.  No signs of acute ischemia.  Salicylate, ethanol, acetaminophen levels within normal limits.  CMP significant for  elevated creatinine 1.09.  Elevated ALT at 46.  Patient has been medically cleared for TTS evaluation.  The patient has been placed in psychiatric observation due to the need to provide a safe environment for the patient while obtaining psychiatric consultation and evaluation, as well as ongoing medical and medication management to treat the patient's condition.  The patient has been placed under full IVC at this time.  Has PCP       Final Clinical Impression(s) / ED Diagnoses Final diagnoses:  Acute psychosis (HCC)    Rx /  DC Orders ED Discharge Orders     None         Jesusita Oka 09/30/22 2214    Rozelle Logan, DO 10/01/22 2317

## 2022-10-01 ENCOUNTER — Encounter (HOSPITAL_COMMUNITY): Payer: Self-pay | Admitting: Psychiatry

## 2022-10-01 DIAGNOSIS — F23 Brief psychotic disorder: Secondary | ICD-10-CM | POA: Diagnosis not present

## 2022-10-01 MED ORDER — OLANZAPINE 5 MG PO TBDP
5.0000 mg | ORAL_TABLET | Freq: Three times a day (TID) | ORAL | Status: DC | PRN
  Administered 2022-10-02: 5 mg via ORAL
  Filled 2022-10-01 (×2): qty 1

## 2022-10-01 MED ORDER — OLANZAPINE 5 MG PO TABS
15.0000 mg | ORAL_TABLET | Freq: Every day | ORAL | Status: DC
  Administered 2022-10-01: 15 mg via ORAL
  Filled 2022-10-01: qty 1

## 2022-10-01 MED ORDER — FLUOXETINE HCL 20 MG PO CAPS
20.0000 mg | ORAL_CAPSULE | Freq: Every day | ORAL | Status: DC
  Administered 2022-10-01 – 2022-10-02 (×2): 20 mg via ORAL
  Filled 2022-10-01 (×2): qty 1

## 2022-10-01 MED ORDER — LORAZEPAM 1 MG PO TABS
1.0000 mg | ORAL_TABLET | ORAL | Status: AC | PRN
  Administered 2022-10-01: 1 mg via ORAL
  Filled 2022-10-01: qty 1

## 2022-10-01 MED ORDER — ZIPRASIDONE MESYLATE 20 MG IM SOLR
20.0000 mg | INTRAMUSCULAR | Status: AC | PRN
  Administered 2022-10-02: 20 mg via INTRAMUSCULAR
  Filled 2022-10-01: qty 20

## 2022-10-01 MED ORDER — TRAZODONE HCL 50 MG PO TABS
50.0000 mg | ORAL_TABLET | Freq: Every evening | ORAL | Status: DC | PRN
  Administered 2022-10-01: 50 mg via ORAL
  Filled 2022-10-01: qty 1

## 2022-10-01 NOTE — ED Provider Notes (Signed)
Emergency Medicine Observation Re-evaluation Note  Samantha Ashley is a 27 y.o. female, seen on rounds today.  Pt initially presented to the ED for complaints of unusual behavior, periods of agitation. Pt currently calm, resting. No new c/o this AM.   Physical Exam  BP 126/76 (BP Location: Left Arm)   Pulse 89   Temp 97.8 F (36.6 C) (Oral)   Resp 18   Ht 1.651 m (5\' 5" )   Wt 113.4 kg   SpO2 98%   BMI 41.60 kg/m  Physical Exam General: resting.  Cardiac: regular rate.  Lungs: breathing comfortably. Psych: calm.  ED Course / MDM    I have reviewed the labs performed to date as well as medications administered while in observation.  Recent changes in the last 24 hours include ED obs, reassessment.   Plan  Will restart patients meds.   BH reassessment this AM.       Cathren Laine, MD 10/01/22 [phone]

## 2022-10-01 NOTE — ED Notes (Signed)
TTS interview completed pt currently pacing room talking to self. Pt is singing loudly but redirectable.

## 2022-10-01 NOTE — ED Notes (Signed)
Remains asleep with no signs of distress or discomfort noted respirations appear easy.
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
## 2022-10-01 NOTE — Progress Notes (Signed)
LCSW Progress Note  [id number]   Samantha Ashley  10/01/2022  11:56 PM    Inpatient Behavioral Health Placement  Pt meets inpatient criteria per Alona Bene, PMHNP. There are no available beds within [facility] system per [facility] [employer].   -This CSW attempted to obtain clarification about pt's Insurance status due to previous conversation with pt's mother Joscelyn Ogier [phone] on 09/28/2022 reported that pt was now covered by [place] [employer] effective 09/26/2022. With the assistance of [facility] patient access; Cipriano Mile informed that there no currently no payor source located for pt: No insurance found on [employer], Pass[employer] or [employer] E.  Referral was sent to the following facilities;   Destination  Service Provider Address Phone [facility]  [address] [place] [address] [phone]  [facility]  [address] [place] [address] [phone]  [facility]  [address] [place] [address] [phone]  [facility]  [address] [place] [address] [phone]  [facility]  [address] [place] [address] [phone]  [facility]  [address] [place] [address] [phone]  [facility]  [address] [place] [address] [phone]  [facility]  [address], [place] [address] [phone]  [facility]  [address] [place] [address] [phone]  [facility]  [address], [place] [address] [phone]  [facility]  [address] [place] [address] [phone]  [facility]  [address], [place] [address]  [phone]  [facility]  [address] [place] [address] [phone]  [facility]  [address], [place] [address] [phone]  [facility]  [address] [place], [place] [address] [phone]  [facility]  [address] [place] [address] [phone]  [facility]  [address] [place] [address] [phone]  [facility]  [address] [place] [address] [phone]  [facility]  [address] [place] [address] [phone]  [facility]  [address], [place] [address] [phone]  [facility]  [address], [place] [address] [phone]  [facility] [address] [place] [address] [phone]  [facility]  [address], [place] [address] [phone]  [facility]  [address] [place] [address] [phone]  [facility]  [address], [place] [address] [phone]  [facility]  [address] [place], [place] [address] [phone]  [facility]  [address] [place] [address] [phone]  [facility]  [address] [place], [place] [address] [phone]  [facility]  [phone] [place] [address] [phone]  [facility]  [address] [place] [address] [phone]  [facility]  [address] [place], [place] [address]  [phone]     Situation ongoing,  CSW will follow up.    [name], MSW, [facility] 10/01/2022 11:56 PM

## 2022-10-01 NOTE — ED Notes (Signed)
Pt is currently sleeping well after being up all night. Will obtain q8hr vitals when she awakes.
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
## 2022-10-02 DIAGNOSIS — F319 Bipolar disorder, unspecified: Secondary | ICD-10-CM | POA: Diagnosis not present

## 2022-10-02 DIAGNOSIS — F23 Brief psychotic disorder: Secondary | ICD-10-CM | POA: Diagnosis not present

## 2022-10-02 DIAGNOSIS — Z7951 Long term (current) use of inhaled steroids: Secondary | ICD-10-CM | POA: Diagnosis not present

## 2022-10-02 DIAGNOSIS — F1210 Cannabis abuse, uncomplicated: Secondary | ICD-10-CM | POA: Diagnosis not present

## 2022-10-02 DIAGNOSIS — Z79899 Other long term (current) drug therapy: Secondary | ICD-10-CM | POA: Diagnosis not present

## 2022-10-02 DIAGNOSIS — F1320 Sedative, hypnotic or anxiolytic dependence, uncomplicated: Secondary | ICD-10-CM | POA: Diagnosis not present

## 2022-10-02 DIAGNOSIS — E282 Polycystic ovarian syndrome: Secondary | ICD-10-CM | POA: Diagnosis not present

## 2022-10-02 DIAGNOSIS — F250 Schizoaffective disorder, bipolar type: Secondary | ICD-10-CM | POA: Diagnosis not present

## 2022-10-02 DIAGNOSIS — Z1152 Encounter for screening for COVID-19: Secondary | ICD-10-CM | POA: Diagnosis not present

## 2022-10-02 DIAGNOSIS — Z5902 Unsheltered homelessness: Secondary | ICD-10-CM | POA: Diagnosis not present

## 2022-10-02 DIAGNOSIS — E538 Deficiency of other specified B group vitamins: Secondary | ICD-10-CM | POA: Diagnosis not present

## 2022-10-02 DIAGNOSIS — J45909 Unspecified asthma, uncomplicated: Secondary | ICD-10-CM | POA: Diagnosis not present

## 2022-10-02 LAB — RESP PANEL BY RT-PCR (RSV, FLU A&B, COVID)  RVPGX2
Influenza A by PCR: NEGATIVE
Influenza B by PCR: NEGATIVE
Resp Syncytial Virus by PCR: NEGATIVE
SARS Coronavirus 2 by RT PCR: NEGATIVE

## 2022-10-02 MED ORDER — DIPHENHYDRAMINE HCL 25 MG PO CAPS
50.0000 mg | ORAL_CAPSULE | ORAL | Status: AC
  Administered 2022-10-02: 50 mg via ORAL
  Filled 2022-10-02: qty 2

## 2022-10-02 MED ORDER — LORAZEPAM 1 MG PO TABS
1.0000 mg | ORAL_TABLET | Freq: Once | ORAL | Status: AC
  Administered 2022-10-02: 1 mg via ORAL
  Filled 2022-10-02: qty 1

## 2022-10-02 MED ORDER — HALOPERIDOL 1 MG PO TABS
2.0000 mg | ORAL_TABLET | ORAL | Status: AC
  Administered 2022-10-02: 2 mg via ORAL
  Filled 2022-10-02: qty 2

## 2022-10-02 MED ORDER — STERILE WATER FOR INJECTION IJ SOLN
INTRAMUSCULAR | Status: AC
  Administered 2022-10-02: 10 mL
  Filled 2022-10-02: qty 10

## 2022-10-02 MED ORDER — LORAZEPAM 1 MG PO TABS
1.0000 mg | ORAL_TABLET | Freq: Four times a day (QID) | ORAL | Status: DC | PRN
  Administered 2022-10-02: 1 mg via ORAL
  Filled 2022-10-02: qty 1

## 2022-10-02 NOTE — ED Notes (Signed)
Patient currently agitated, pounding on windows, and coming to the door and yelling at another patient. Patient difficult to redirect at this time. MD notified.

## 2022-10-02 NOTE — ED Notes (Signed)
Samantha Ashley is currently sleeping with no distress or discomfort respirations appear easy.

## 2022-10-02 NOTE — ED Notes (Signed)
Report given to Lynnda Shields., RN at [facility].

## 2022-10-02 NOTE — ED Notes (Signed)
Patient slamming doors and yelling at all staff at this time.

## 2022-10-02 NOTE — Progress Notes (Addendum)
ADDENDUM  9:20 AM - CSW spoke with intake coordinator at [facility] via phone call. Intake coordinator confirmed pt's acceptance for today, pending negative covid results. CSW advised intake coordinator that covid results were faxed about 1 hour ago. Intake coordinator reports they have not received it. CSW re-faxed covid results to [facility] intake department. CSW will await follow up.  8:19 AM - Per Alfonzo Feller, RN, pt has been accepted to [facility], pending negative covid test. This CSW sent negative covid results to [facility] via fax. CSW will await follow up regarding accepting information.  [name], LCSW  10/02/2022 8:21 AM

## 2022-10-02 NOTE — ED Notes (Addendum)
Call from [place] at [facility] assessment requesting IVC paper fax to [phone] for possible placement there.

## 2022-10-02 NOTE — ED Notes (Addendum)
Pt very disruptive needy and attention seeking, banging on glass of door and walls. Verbal de-escalation attempted unsuccessfully po medications offered and refused and loud disruptive behavior continued. Medication for agitation given IM since Samantha Ashley refused PO

## 2022-10-02 NOTE — ED Notes (Signed)
Medicated for agitation and anxiety pt pacing her room having rambling conversation with her self . Appear to be upset because staff will not allow her to stand in her door way and make nasty mean comment toward the pt in room 27. Zyprexa 5 mg po given.

## 2022-10-02 NOTE — Progress Notes (Signed)
Pt was accepted to [facility] TODAY 10/02/2022  Pt meets inpatient criteria per Alona Bene, PMHNP  Attending Physician will be Lorenso Courier, MD  Report can be called to: [phone]  Bed is ready now, pt can arrive anytime  Care Team Notified: Alona Bene, PMHNP and Jeral Fruit, RN  [name], [place]  10/02/2022 9:37 AM

## 2022-10-02 NOTE — ED Provider Notes (Signed)
Emergency Medicine Observation Re-evaluation Note  Samantha Ashley is a 27 y.o. female, seen on rounds today.  Pt initially presented to the ED for complaints of Psychiatric Evaluation Currently, the patient is awake alert .  Physical Exam  BP (!) 125/94 (BP Location: Left Arm)   Pulse 78   Temp 97.6 F (36.4 C) (Oral)   Resp 17   Ht 1.651 m (5\' 5" )   Wt 113.4 kg   SpO2 99%   BMI 41.60 kg/m  Physical Exam General: wdwn Cardiac: rrr Lungs: normal rate, sats 99% Psych: mania  ED Course / MDM  EKG:EKG Interpretation  Date/Time:  Wednesday September 30 2022 20:54:40 EDT Ventricular Rate:  100 PR Interval:  139 QRS Duration: 79 QT Interval:  352 QTC Calculation: 454 R Axis:   82 Text Interpretation: Sinus rhythm No significant change since last tracing Confirmed by Cathren Laine ([id number]) on 10/01/2022 12:01:47 PM  I have reviewed the labs performed to date as well as medications administered while in observation.  Recent changes in the last 24 hours include patient to go to [facility].  Plan  Current plan is for admission to [facility], MD 10/02/22 1027

## 2022-10-02 NOTE — ED Notes (Signed)
Return call from [place] at [facility] , [place] has been accepted pending a negative COVID test. Nasal swab specimen obtained and sent to lab.

## 2022-10-20 ENCOUNTER — Other Ambulatory Visit: Payer: Self-pay

## 2022-10-20 ENCOUNTER — Encounter (HOSPITAL_COMMUNITY): Payer: Self-pay

## 2022-10-20 ENCOUNTER — Emergency Department (HOSPITAL_COMMUNITY)
Admission: EM | Admit: 2022-10-20 | Discharge: 2022-10-21 | Disposition: A | Discharge status: Other Acute Inpatient Hospital | Payer: Medicaid Other | Attending: Emergency Medicine | Admitting: Emergency Medicine

## 2022-10-20 DIAGNOSIS — J45909 Unspecified asthma, uncomplicated: Secondary | ICD-10-CM | POA: Insufficient documentation

## 2022-10-20 DIAGNOSIS — F250 Schizoaffective disorder, bipolar type: Secondary | ICD-10-CM | POA: Diagnosis not present

## 2022-10-20 DIAGNOSIS — F17210 Nicotine dependence, cigarettes, uncomplicated: Secondary | ICD-10-CM | POA: Diagnosis not present

## 2022-10-20 DIAGNOSIS — F309 Manic episode, unspecified: Secondary | ICD-10-CM | POA: Diagnosis present

## 2022-10-20 LAB — CBC WITH DIFFERENTIAL/PLATELET
Abs Immature Granulocytes: 0.15 10*3/uL — ABNORMAL HIGH (ref 0.00–0.07)
Basophils Absolute: 0 10*3/uL (ref 0.0–0.1)
Basophils Relative: 0 %
Eosinophils Absolute: 0.2 10*3/uL (ref 0.0–0.5)
Eosinophils Relative: 1 %
HCT: 36 % (ref 36.0–46.0)
Hemoglobin: 11.3 g/dL — ABNORMAL LOW (ref 12.0–15.0)
Immature Granulocytes: 1 %
Lymphocytes Relative: 15 %
Lymphs Abs: 2.1 10*3/uL (ref 0.7–4.0)
MCH: 26.7 pg (ref 26.0–34.0)
MCHC: 31.4 g/dL (ref 30.0–36.0)
MCV: 85.1 fL (ref 80.0–100.0)
Monocytes Absolute: 0.9 10*3/uL (ref 0.1–1.0)
Monocytes Relative: 7 %
Neutro Abs: 10.6 10*3/uL — ABNORMAL HIGH (ref 1.7–7.7)
Neutrophils Relative %: 76 %
Platelets: 329 10*3/uL (ref 150–400)
RBC: 4.23 MIL/uL (ref 3.87–5.11)
RDW: 18.5 % — ABNORMAL HIGH (ref 11.5–15.5)
WBC: 14 10*3/uL — ABNORMAL HIGH (ref 4.0–10.5)
nRBC: 0 % (ref 0.0–0.2)

## 2022-10-20 LAB — COMPREHENSIVE METABOLIC PANEL
ALT: 34 U/L (ref 0–44)
AST: 30 U/L (ref 15–41)
Albumin: 3.2 g/dL — ABNORMAL LOW (ref 3.5–5.0)
Alkaline Phosphatase: 75 U/L (ref 38–126)
Anion gap: 8 (ref 5–15)
BUN: 6 mg/dL (ref 6–20)
CO2: 21 mmol/L — ABNORMAL LOW (ref 22–32)
Calcium: 8.4 mg/dL — ABNORMAL LOW (ref 8.9–10.3)
Chloride: 108 mmol/L (ref 98–111)
Creatinine, Ser: 0.75 mg/dL (ref 0.44–1.00)
GFR, Estimated: >60 mL/min (ref >60)
Glucose, Bld: 97 mg/dL (ref 70–99)
Potassium: 3.9 mmol/L (ref 3.5–5.1)
Sodium: 137 mmol/L (ref 135–145)
Total Bilirubin: 0.4 mg/dL (ref 0.3–1.2)
Total Protein: 7.5 g/dL (ref 6.5–8.1)

## 2022-10-20 LAB — LITHIUM LEVEL: Lithium Lvl: 0.18 mmol/L — ABNORMAL LOW (ref 0.60–1.20)

## 2022-10-20 LAB — RAPID URINE DRUG SCREEN, HOSP PERFORMED
Amphetamines: NOT DETECTED
Barbiturates: NOT DETECTED
Benzodiazepines: NOT DETECTED
Cocaine: NOT DETECTED
Opiates: NOT DETECTED
Tetrahydrocannabinol: POSITIVE — AB

## 2022-10-20 LAB — ETHANOL: Alcohol, Ethyl (B): <10 mg/dL (ref <10)

## 2022-10-20 LAB — ACETAMINOPHEN LEVEL: Acetaminophen (Tylenol), Serum: <10 ug/mL — ABNORMAL LOW (ref 10–30)

## 2022-10-20 LAB — SALICYLATE LEVEL: Salicylate Lvl: <7 mg/dL — ABNORMAL LOW (ref 7.0–30.0)

## 2022-10-20 LAB — HCG, SERUM, QUALITATIVE: Preg, Serum: NEGATIVE

## 2022-10-20 MED ORDER — OLANZAPINE 10 MG IM SOLR
INTRAMUSCULAR | Status: AC
  Administered 2022-10-20: 10 mg via INTRAMUSCULAR
  Filled 2022-10-20: qty 10

## 2022-10-20 MED ORDER — LORAZEPAM 2 MG/ML IJ SOLN
2.0000 mg | Freq: Four times a day (QID) | INTRAMUSCULAR | Status: DC | PRN
  Administered 2022-10-20: 2 mg via INTRAMUSCULAR
  Filled 2022-10-20: qty 1

## 2022-10-20 MED ORDER — OLANZAPINE 10 MG IM SOLR
10.0000 mg | Freq: Once | INTRAMUSCULAR | Status: AC
  Filled 2022-10-20: qty 10

## 2022-10-20 MED ORDER — HALOPERIDOL 5 MG PO TABS: 10.0000 mg | ORAL_TABLET | Freq: Three times a day (TID) | ORAL | Status: DC | PRN

## 2022-10-20 MED ORDER — DIPHENHYDRAMINE HCL 50 MG/ML IJ SOLN
50.0000 mg | Freq: Once | INTRAMUSCULAR | Status: AC
  Administered 2022-10-20: 50 mg via INTRAMUSCULAR
  Filled 2022-10-20: qty 1

## 2022-10-20 MED ORDER — OLANZAPINE 5 MG PO TBDP: 10.0000 mg | ORAL_TABLET | Freq: Every day | ORAL | Status: DC

## 2022-10-20 MED ORDER — CARBAMAZEPINE ER 200 MG PO TB12
200.0000 mg | ORAL_TABLET | Freq: Two times a day (BID) | ORAL | Status: DC
  Administered 2022-10-21: 200 mg via ORAL
  Filled 2022-10-20 (×3): qty 1

## 2022-10-20 MED ORDER — STERILE WATER FOR INJECTION IJ SOLN
INTRAMUSCULAR | Status: AC
  Filled 2022-10-20: qty 10

## 2022-10-20 MED ORDER — HALOPERIDOL LACTATE 5 MG/ML IJ SOLN
10.0000 mg | Freq: Three times a day (TID) | INTRAMUSCULAR | Status: DC | PRN
  Administered 2022-10-20: 10 mg via INTRAMUSCULAR
  Filled 2022-10-20: qty 2

## 2022-10-20 MED ORDER — ZIPRASIDONE MESYLATE 20 MG IM SOLR
INTRAMUSCULAR | Status: AC
  Filled 2022-10-20: qty 20

## 2022-10-20 MED ORDER — ZIPRASIDONE MESYLATE 20 MG IM SOLR
10.0000 mg | Freq: Once | INTRAMUSCULAR | Status: AC
  Administered 2022-10-20: 10 mg via INTRAMUSCULAR

## 2022-10-20 MED ORDER — LITHIUM CARBONATE ER 300 MG PO TBCR
600.0000 mg | EXTENDED_RELEASE_TABLET | Freq: Two times a day (BID) | ORAL | Status: DC
  Administered 2022-10-21: 600 mg via ORAL
  Filled 2022-10-20 (×2): qty 2

## 2022-10-20 MED ORDER — LITHIUM CARBONATE ER 300 MG PO TBCR
300.0000 mg | EXTENDED_RELEASE_TABLET | Freq: Two times a day (BID) | ORAL | Status: DC
  Filled 2022-10-20: qty 1

## 2022-10-20 MED ORDER — LORAZEPAM 1 MG PO TABS: 2.0000 mg | ORAL_TABLET | Freq: Four times a day (QID) | ORAL | Status: DC | PRN

## 2022-10-20 NOTE — ED Notes (Signed)
Pt belongings placed in locker 4 

## 2022-10-20 NOTE — ED Notes (Signed)
Pt notified this RN that she does not wish for any of her information to be shared w/ anyone.
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
## 2022-10-20 NOTE — ED Notes (Signed)
IVC completed  EXP: 10/27/22 Original in red magistrate folder, CASE #: [id number] 1 copy faxed to [facility] 3 copies on purple clip board in purple zone with pt.

## 2022-10-20 NOTE — Progress Notes (Signed)
LCSW Progress Note  [id number]   Samantha Ashley  10/20/2022  12:58 PM  Description:   Inpatient Psychiatric Referral  Patient was recommended inpatient per Eligha Bridegroom, NP. There are no available beds at [facility], per [facility] Rona Ravens, RN. Patient was referred to the following out of network facilities:   [facility] Provider Address Phone Fax  [facility]  [address], [place] [address] [phone]  [facility]  [address] [place] [address] [phone]  [facility]  [address] [place] [address] [phone]  [facility]  [address] [place], [place] [address] [phone]  [facility] [address] [place]  [address] [place] [address] [phone]  [facility]  [address] [place], [place] [address] [phone]  [facility]  [address] [place] [address] [phone]  [facility]  [address] [place] [address] [phone]  [facility]  [address] [place] [address] [phone]  [facility]  [address] [place], [place] [address] [phone]  [facility]  [address] [place] [address] [phone]  [facility]  [address] [place] [address] [phone]  [facility]  [address], [place] [address] [phone]  [facility]  [address] [place] [address] [phone]  [facility]  [address] [place] [address] [phone]  [facility]  [address]  [place] [address] [phone]  [facility]  [address], [place] [address] [phone]  [facility]  [address] [place] [address] [phone]  [name]  [address], [place] [address] [phone]  [facility]  [address], [place] [address] [phone]  [facility]  [address] [place] [address] [phone]  [facility]  [address], [place] [address] [phone]  [facility]  [address], [place] [address] [phone]  [facility]  [address] [place] [address] [phone]  [facility]  [address], [place] [address] [phone]  [facility]  [address], [place] [address] [phone]  [facility]  [address], [place] [address] [phone]    Situation ongoing, CSW to continue following and update chart as more information becomes available.      [name], [place]  10/20/2022 12:58 PM

## 2022-10-20 NOTE — ED Triage Notes (Addendum)
Patient arrives accompanied by police.   Patient states "I'm bipolar and I'm on a lot of mental health medications, and I'm not ok with the chemical balance". Patient tearful. Denies SI/HI.   Does report anxiety.   Per officer police called on patient by parents.   Patient was screaming and throwing things, stating she was manic.     Patient calm and cooperative on arrival.
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
## 2022-10-21 ENCOUNTER — Encounter (HOSPITAL_COMMUNITY): Payer: Self-pay | Admitting: Psychiatry

## 2022-10-21 DIAGNOSIS — F250 Schizoaffective disorder, bipolar type: Secondary | ICD-10-CM

## 2022-10-21 MED ORDER — ACETAMINOPHEN 500 MG PO TABS
1000.0000 mg | ORAL_TABLET | Freq: Four times a day (QID) | ORAL | Status: DC | PRN
  Administered 2022-10-21: 1000 mg via ORAL
  Filled 2022-10-21: qty 2

## 2022-10-21 MED ORDER — DIPHENHYDRAMINE HCL 50 MG/ML IJ SOLN: 50.0000 mg | Freq: Three times a day (TID) | INTRAMUSCULAR | Status: DC | PRN

## 2022-10-21 MED ORDER — DIPHENHYDRAMINE HCL 25 MG PO CAPS: 50.0000 mg | ORAL_CAPSULE | Freq: Three times a day (TID) | ORAL | Status: DC | PRN

## 2022-10-21 MED ORDER — OLANZAPINE 5 MG PO TBDP: 15.0000 mg | ORAL_TABLET | Freq: Every day | ORAL | Status: DC

## 2022-10-21 NOTE — ED Notes (Signed)
Patient was able to slide her hands out bilateral wrist restraints; Patient did not attempt to get up but turned over and went back to sleep; Charge RN notified that patient removed self from restraints; Order expires in less than 30 minutes and patient has been asleep. RN will discontinue restraints at this time and continue to monitor for need to renew order; [employer] in unit for support-Monique,RN
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
## 2022-10-21 NOTE — ED Notes (Signed)
[facility] Sheriff contacted for transport; transferred call to RN for behavioral questions.

## 2022-10-21 NOTE — Progress Notes (Signed)
Pt was accepted to [facility] TODAY 10/21/2022. Bed assignment: [facility] unit  Pt meets inpatient criteria per Arsenio Loader, NP  Attending Physician will be Joylene Igo, AGPCNP  Report can be called to: [phone]  Pt can arrive anytime; bed is ready now  Care Team Notified: Arsenio Loader, NP, Adonis Brook, RN, Ahmed Prima, RN, and [address] LCSWA  [place], [place]  10/21/2022 1:59 PM

## 2022-10-21 NOTE — Progress Notes (Signed)
LCSW Progress Note  [id number]   Samantha Ashley  10/21/2022  1:29 PM  Description:   Inpatient Psychiatric Referral  Patient was recommended inpatient per Arsenio Loader, NP. There are no available beds at [facility], per [facility] Malva Limes, RN. Patient was referred to the following out of network facilities:   [facility] Provider Address Phone Fax  [facility]  [address] [place] [address] [phone]  [facility]  [address] [place] [address] [phone]  [facility]  [address] [place] [address] [phone]  [facility]  [address] [place], [place] [address] [phone]  [facility] [address] [place]  [address] [place] [address] [phone]  [facility]  [address] [place], [place] [address] [phone]  [facility]  [address] [place] [address] [phone]  [facility]  [address] [place] [address] [phone]  [facility]  [address] [place] [address] [phone]  [facility]  [address] [place], [place] [address] [phone]  [facility]  [address] [place] [address] [phone]  [facility]  [address] [place] [address] [phone]  [facility]  [address], [place] [address] [phone]  [facility]  [address] [place] [address] [phone]  [facility]  [address], [place] [address] [phone]  [facility]  [address]  [place] [address] [phone]  [facility]  [address] [place] [address] [phone]  [facility]  [address] [place] [address] [phone]  [facility]  [address] [place] [address] [phone]  [facility]  [address], [place] [address] [phone]  [facility]  [address] [place] [address] [phone]  [facility]  [address], [place] [address] [phone]  [facility]  [address], [place] [address] [phone]  [facility]  [address] [place] [address] [phone]  [facility]  [address] [place] [address] [phone]  [facility]  [address], [place] [address] [phone]  [facility]  [address] [place] [address] [phone]    Situation ongoing, CSW to continue following and update chart as more information becomes available.      [name], [place]  10/21/2022 1:29 PM

## 2022-10-21 NOTE — ED Provider Notes (Signed)
Emergency Medicine Observation Re-evaluation Note  Samantha Ashley is a 27 y.o. female, seen on rounds today.  Pt initially presented to the ED for complaints of Psychiatric Evaluation Currently, the patient is accepted to [facility].  Physical Exam  BP (!) 153/74   Pulse 94   Temp 98.5 F (36.9 C)   Resp 20   Ht 5\' 5"  (1.651 m)   SpO2 100%   BMI 41.60 kg/m  Physical Exam General: Alert no distress pleasant Cardiac: Regular Lungs: Huel Cote to auscultation Psych: Pleasantly interactive  ED Course / MDM  EKG:EKG Interpretation  Date/Time:  Tuesday October 20 2022 10:25:15 EDT Ventricular Rate:  95 PR Interval:  159 QRS Duration: 82 QT Interval:  358 QTC Calculation: 450 R Axis:   60 Text Interpretation: Sinus rhythm since last tracing no significant change Confirmed by Rolan Bucco [phone]) on 10/20/2022 10:44:24 AM  I have reviewed the labs performed to date as well as medications administered while in observation.  Recent changes in the last 24 hours include none.  Plan  Current plan is for EMTALA completed.    Arby Barrette, MD 10/21/22 1622

## 2022-10-21 NOTE — ED Notes (Signed)
Patient's mother dropped off, patient's glasses.

## 2022-10-21 NOTE — ED Notes (Signed)
This Pt has  been IVC

## 2022-10-21 NOTE — ED Provider Notes (Signed)
  Physical Exam  BP 116/66   Pulse 86   Temp 98.7 F (37.1 C) (Oral)   Resp 20   Ht 5\' 5"  (1.651 m)   SpO2 100%   BMI 41.60 kg/m   Physical Exam  Procedures  Procedures  ED Course / MDM    Medical Decision Making Amount and/or Complexity of Data Reviewed Labs: ordered.  Risk Prescription drug management.   Patient is pending inpatient treatment.  No issues overnight.       Benjiman Core, MD 10/21/22 [phone]

## 2022-10-22 DIAGNOSIS — F4310 Post-traumatic stress disorder, unspecified: Secondary | ICD-10-CM | POA: Diagnosis not present

## 2022-10-22 DIAGNOSIS — F411 Generalized anxiety disorder: Secondary | ICD-10-CM | POA: Diagnosis not present

## 2022-10-22 DIAGNOSIS — F3112 Bipolar disorder, current episode manic without psychotic features, moderate: Secondary | ICD-10-CM | POA: Diagnosis not present

## 2022-10-23 DIAGNOSIS — F411 Generalized anxiety disorder: Secondary | ICD-10-CM | POA: Diagnosis not present

## 2022-10-23 DIAGNOSIS — F3112 Bipolar disorder, current episode manic without psychotic features, moderate: Secondary | ICD-10-CM | POA: Diagnosis not present

## 2022-10-23 DIAGNOSIS — F4310 Post-traumatic stress disorder, unspecified: Secondary | ICD-10-CM | POA: Diagnosis not present

## 2022-10-24 DIAGNOSIS — F3112 Bipolar disorder, current episode manic without psychotic features, moderate: Secondary | ICD-10-CM | POA: Diagnosis not present

## 2022-10-24 DIAGNOSIS — F4310 Post-traumatic stress disorder, unspecified: Secondary | ICD-10-CM | POA: Diagnosis not present

## 2022-10-24 DIAGNOSIS — F411 Generalized anxiety disorder: Secondary | ICD-10-CM | POA: Diagnosis not present

## 2022-10-25 DIAGNOSIS — F3112 Bipolar disorder, current episode manic without psychotic features, moderate: Secondary | ICD-10-CM | POA: Diagnosis not present

## 2022-10-25 DIAGNOSIS — F4310 Post-traumatic stress disorder, unspecified: Secondary | ICD-10-CM | POA: Diagnosis not present

## 2022-10-25 DIAGNOSIS — F411 Generalized anxiety disorder: Secondary | ICD-10-CM | POA: Diagnosis not present

## 2022-10-28 DIAGNOSIS — F411 Generalized anxiety disorder: Secondary | ICD-10-CM | POA: Diagnosis not present

## 2022-10-28 DIAGNOSIS — F4310 Post-traumatic stress disorder, unspecified: Secondary | ICD-10-CM | POA: Diagnosis not present

## 2022-10-28 DIAGNOSIS — F3112 Bipolar disorder, current episode manic without psychotic features, moderate: Secondary | ICD-10-CM | POA: Diagnosis not present

## 2022-10-29 DIAGNOSIS — F3112 Bipolar disorder, current episode manic without psychotic features, moderate: Secondary | ICD-10-CM | POA: Diagnosis not present

## 2022-10-29 DIAGNOSIS — F4310 Post-traumatic stress disorder, unspecified: Secondary | ICD-10-CM | POA: Diagnosis not present

## 2022-10-29 DIAGNOSIS — F411 Generalized anxiety disorder: Secondary | ICD-10-CM | POA: Diagnosis not present

## 2022-11-29 ENCOUNTER — Emergency Department (HOSPITAL_COMMUNITY)
Admission: EM | Admit: 2022-11-29 | Discharge: 2022-12-01 | Disposition: A | Discharge status: Other Acute Inpatient Hospital | Payer: Medicaid Other | Source: Home / Self Care | Attending: Emergency Medicine | Admitting: Emergency Medicine

## 2022-11-29 DIAGNOSIS — R4689 Other symptoms and signs involving appearance and behavior: Secondary | ICD-10-CM

## 2022-11-29 DIAGNOSIS — R451 Restlessness and agitation: Secondary | ICD-10-CM

## 2022-11-29 DIAGNOSIS — J45909 Unspecified asthma, uncomplicated: Secondary | ICD-10-CM | POA: Diagnosis not present

## 2022-11-29 DIAGNOSIS — F250 Schizoaffective disorder, bipolar type: Secondary | ICD-10-CM | POA: Diagnosis present

## 2022-11-29 DIAGNOSIS — F1220 Cannabis dependence, uncomplicated: Secondary | ICD-10-CM | POA: Diagnosis not present

## 2022-11-29 DIAGNOSIS — Z1152 Encounter for screening for COVID-19: Secondary | ICD-10-CM | POA: Insufficient documentation

## 2022-11-29 DIAGNOSIS — R456 Violent behavior: Secondary | ICD-10-CM | POA: Diagnosis present

## 2022-11-29 DIAGNOSIS — F312 Bipolar disorder, current episode manic severe with psychotic features: Secondary | ICD-10-CM | POA: Diagnosis not present

## 2022-11-29 DIAGNOSIS — F309 Manic episode, unspecified: Secondary | ICD-10-CM

## 2022-11-29 LAB — CBC WITH DIFFERENTIAL/PLATELET
Abs Immature Granulocytes: 0.02 10*3/uL (ref 0.00–0.07)
Basophils Absolute: 0 10*3/uL (ref 0.0–0.1)
Basophils Relative: 1 %
Eosinophils Absolute: 0.1 10*3/uL (ref 0.0–0.5)
Eosinophils Relative: 1 %
HCT: 37.4 % (ref 36.0–46.0)
Hemoglobin: 11.8 g/dL — ABNORMAL LOW (ref 12.0–15.0)
Immature Granulocytes: 0 %
Lymphocytes Relative: 38 %
Lymphs Abs: 3.3 10*3/uL (ref 0.7–4.0)
MCH: 26.9 pg (ref 26.0–34.0)
MCHC: 31.6 g/dL (ref 30.0–36.0)
MCV: 85.4 fL (ref 80.0–100.0)
Monocytes Absolute: 0.4 10*3/uL (ref 0.1–1.0)
Monocytes Relative: 5 %
Neutro Abs: 4.7 10*3/uL (ref 1.7–7.7)
Neutrophils Relative %: 55 %
Platelets: 309 10*3/uL (ref 150–400)
RBC: 4.38 MIL/uL (ref 3.87–5.11)
RDW: 14.9 % (ref 11.5–15.5)
WBC: 8.5 10*3/uL (ref 4.0–10.5)
nRBC: 0 % (ref 0.0–0.2)

## 2022-11-29 LAB — COMPREHENSIVE METABOLIC PANEL
ALT: 24 U/L (ref 0–44)
AST: 23 U/L (ref 15–41)
Albumin: 3.5 g/dL (ref 3.5–5.0)
Alkaline Phosphatase: 66 U/L (ref 38–126)
Anion gap: 10 (ref 5–15)
BUN: 7 mg/dL (ref 6–20)
CO2: 21 mmol/L — ABNORMAL LOW (ref 22–32)
Calcium: 9.2 mg/dL (ref 8.9–10.3)
Chloride: 107 mmol/L (ref 98–111)
Creatinine, Ser: 1.03 mg/dL — ABNORMAL HIGH (ref 0.44–1.00)
GFR, Estimated: >60 mL/min (ref >60)
Glucose, Bld: 104 mg/dL — ABNORMAL HIGH (ref 70–99)
Potassium: 3.1 mmol/L — ABNORMAL LOW (ref 3.5–5.1)
Sodium: 138 mmol/L (ref 135–145)
Total Bilirubin: 0.5 mg/dL (ref 0.3–1.2)
Total Protein: 7.6 g/dL (ref 6.5–8.1)

## 2022-11-29 LAB — RAPID URINE DRUG SCREEN, HOSP PERFORMED
Amphetamines: NOT DETECTED
Barbiturates: NOT DETECTED
Benzodiazepines: NOT DETECTED
Cocaine: NOT DETECTED
Opiates: NOT DETECTED
Tetrahydrocannabinol: POSITIVE — AB

## 2022-11-29 LAB — ETHANOL: Alcohol, Ethyl (B): <10 mg/dL (ref <10)

## 2022-11-29 LAB — HCG, SERUM, QUALITATIVE: Preg, Serum: NEGATIVE

## 2022-11-29 MED ORDER — POTASSIUM CHLORIDE CRYS ER 20 MEQ PO TBCR
40.0000 meq | EXTENDED_RELEASE_TABLET | Freq: Once | ORAL | Status: AC
  Administered 2022-11-30: 40 meq via ORAL
  Filled 2022-11-29: qty 2
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
## 2022-11-29 NOTE — BH Assessment (Signed)
Contacted [facility] to request tele-psychiatry and spoke to [employer]. Dr Wandra Feinstein is scheduled to see Pt via [email or web address] at midnight. Created secure message including Dr Wandra Feinstein, Dr Cristal Deer Tegeler, Alene Mires, RN, and Sinclair Ship.   Pamalee Leyden, [facility], [facility] Triage Specialist
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
## 2022-11-29 NOTE — ED Triage Notes (Signed)
Pt to ED via [employer] from uncle's home from family reunion. Pt's family called [employer] d/t combative behavior. Pt's Family reports pt throwing dishes at home and "breaking everything in sight." Pt denies SI / HI. Pt screaming, crying, and making threats upon arrival to ED.

## 2022-11-29 NOTE — ED Notes (Addendum)
[id number] Samantha Ashley- Patient's mother

## 2022-11-29 NOTE — ED Notes (Addendum)
TTS machine at bedside.  Patient talking to telepsych at this time.

## 2022-11-29 NOTE — ED Notes (Signed)
Pt dressed out into paper scrubs. Pt's belongings removed from pt, bagged, labeled, and placed in locker #4. Security requested to wand pt.
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
## 2022-11-29 NOTE — ED Notes (Signed)
Samantha Ashley [phone] Patient's wife

## 2022-11-30 ENCOUNTER — Encounter (HOSPITAL_COMMUNITY): Payer: Self-pay

## 2022-11-30 ENCOUNTER — Other Ambulatory Visit: Payer: Self-pay

## 2022-11-30 DIAGNOSIS — F250 Schizoaffective disorder, bipolar type: Secondary | ICD-10-CM

## 2022-11-30 LAB — RESP PANEL BY RT-PCR (RSV, FLU A&B, COVID)  RVPGX2
Influenza A by PCR: NEGATIVE
Influenza B by PCR: NEGATIVE
Resp Syncytial Virus by PCR: NEGATIVE
SARS Coronavirus 2 by RT PCR: NEGATIVE

## 2022-11-30 MED ORDER — LORAZEPAM 2 MG/ML IJ SOLN
INTRAMUSCULAR | Status: AC
  Filled 2022-11-30: qty 1

## 2022-11-30 MED ORDER — LORAZEPAM 1 MG PO TABS
1.0000 mg | ORAL_TABLET | Freq: Three times a day (TID) | ORAL | Status: DC | PRN
  Administered 2022-11-30: 1 mg via ORAL
  Filled 2022-11-30: qty 1

## 2022-11-30 MED ORDER — OXCARBAZEPINE 300 MG PO TABS
300.0000 mg | ORAL_TABLET | Freq: Two times a day (BID) | ORAL | Status: DC
  Administered 2022-11-30 – 2022-12-01 (×3): 300 mg via ORAL
  Filled 2022-11-30 (×3): qty 1

## 2022-11-30 MED ORDER — OLANZAPINE 10 MG IM SOLR
10.0000 mg | Freq: Once | INTRAMUSCULAR | Status: AC | PRN
  Administered 2022-11-30: 10 mg via INTRAMUSCULAR
  Filled 2022-11-30: qty 10

## 2022-11-30 MED ORDER — OLANZAPINE 5 MG PO TBDP
10.0000 mg | ORAL_TABLET | Freq: Three times a day (TID) | ORAL | Status: DC | PRN
  Administered 2022-11-30 – 2022-12-01 (×3): 10 mg via ORAL
  Filled 2022-11-30 (×3): qty 2

## 2022-11-30 MED ORDER — ZIPRASIDONE MESYLATE 20 MG IM SOLR
20.0000 mg | Freq: Once | INTRAMUSCULAR | Status: AC
  Administered 2022-11-30: 20 mg via INTRAMUSCULAR
  Filled 2022-11-30: qty 20

## 2022-11-30 MED ORDER — NICOTINE 21 MG/24HR TD PT24
21.0000 mg | MEDICATED_PATCH | Freq: Every day | TRANSDERMAL | Status: DC
  Filled 2022-11-30 (×2): qty 1

## 2022-11-30 MED ORDER — LORAZEPAM 1 MG PO TABS
1.0000 mg | ORAL_TABLET | ORAL | Status: DC | PRN
  Administered 2022-11-30 – 2022-12-01 (×2): 1 mg via ORAL
  Filled 2022-11-30 (×2): qty 1

## 2022-11-30 MED ORDER — ARIPIPRAZOLE 5 MG PO TABS
15.0000 mg | ORAL_TABLET | Freq: Every day | ORAL | Status: DC
  Administered 2022-11-30 – 2022-12-01 (×2): 15 mg via ORAL
  Filled 2022-11-30: qty 2
  Filled 2022-11-30: qty 1

## 2022-11-30 NOTE — ED Notes (Signed)
Patient yelling screaming slamming doors stating she is going to break things Staffing called sitter request made was advised none available CN notified of situation and escalating behavior Security and [employer] at bedside Patient screaming and cursing and threatening staff waking everyone in unit up

## 2022-11-30 NOTE — ED Notes (Signed)
Patient to rr with sitter

## 2022-11-30 NOTE — ED Notes (Signed)
Pt in room watching TV. Asher Muir, charge RN had explained to the pt about how long she has to be here and what IVC means. Ordered breakfast tray for the pt.

## 2022-11-30 NOTE — ED Notes (Signed)
Patient ringing call light on wall and demanding to go home asked to please stop pt states LET ME GO HOME NOW

## 2022-11-30 NOTE — ED Notes (Signed)
Patient up out of bed pushing call light buttons on wall and starring down staff  refuses to get into bed

## 2022-11-30 NOTE — Progress Notes (Addendum)
Pt has been accepted to [facility] TOMORROW 12/01/2022. Bed assignment: [facility]  Pt meets inpatient criteria per Eligha Bridegroom, NP  Attending Physician will be Theophilus Bones, MD  Report can be called to: [phone] or [phone]  Pt can arrive anytime after 8 AM  Care Team Notified: Eligha Bridegroom, NP, Job Founds, RN, and Renne Crigler, RN  Johannah Rozas, LCSW  11/30/2022 1:49 PM

## 2022-11-30 NOTE — ED Notes (Signed)
Patient yelling this isnt my room let me go home now I am not going to be quiet ubtil you let me go

## 2022-11-30 NOTE — ED Provider Notes (Signed)
Emergency Medicine Observation Re-evaluation Note  Samantha Ashley is a 27 y.o. female, seen on rounds today.  Pt initially presented to the ED for complaints of Aggressive Behavior Currently, the patient is upset. She is asking to leave and reports this is a family dispute.  Physical Exam  BP 127/86   Pulse 60   Temp 98 F (36.7 C)   Resp 17   Ht 5\' 5"  (1.651 m)   Wt 113.4 kg   SpO2 100%   BMI 41.60 kg/m  Physical Exam General: upset Lungs: normal effort Psych: no SI  ED Course / MDM  EKG:EKG Interpretation Date/Time:  Sunday November 29 2022 21:26:56 EDT Ventricular Rate:  102 PR Interval:  142 QRS Duration:  92 QT Interval:  340 QTC Calculation: 443 R Axis:   47  Text Interpretation: Sinus tachycardia Atrial premature complex Borderline T abnormalities, diffuse leads when compared to prior, faster rate. No STEMI Confirmed by Theda Belfast ([id number]) on 11/29/2022 9:37:40 PM  I have reviewed the labs performed to date as well as medications administered while in observation.  Recent changes in the last 24 hours include psychiatric meds being ordered.  Plan  Current plan is for psychiatric admission. She is under IVC. Will re-consult TTS and have her re-evaluated this morning for possible discharge/change of plans.    Pricilla Loveless, MD 11/30/22 [phone]

## 2022-11-30 NOTE — ED Notes (Signed)
Patient standing in door way staring down staff once again refusing to get into bed

## 2022-11-30 NOTE — ED Notes (Signed)
Pt on the phone and agitated.

## 2022-11-30 NOTE — ED Notes (Signed)
Patient observed making phone calls to her mother to ask when her glasses would be delivered. Patient's chart is confidential which does not allow any staff to give her mother information about her presence in the hospital when she tries to get information to bring said glasses. Patient informed about this and requests for her chart to remain confidential. Patient informed this means her mother will not be able to bring her glasses to her due to this status.

## 2022-11-30 NOTE — ED Notes (Signed)
Patient observed to make second phone call of the day, patient verbalized agreement to remove confidential encounter from her chart until she receives her glasses. Second RN witnessed patient verbalize this as well.

## 2022-11-30 NOTE — ED Notes (Signed)
Patient slammed door closed trying to break glass demanding to go home
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
## 2022-11-30 NOTE — ED Notes (Signed)
IVC Paperwork in process. 

## 2022-11-30 NOTE — ED Notes (Signed)
All cords and phone removed from room and room made as safe as possible sitter at bedside patient pending medications

## 2022-11-30 NOTE — ED Notes (Signed)
Patient to rr

## 2022-11-30 NOTE — ED Notes (Signed)
Pt with flat affect. Pacing in and outside of room, despite PRNs given for anxiety. Water and snack offered. Pt calm and cooperative

## 2022-11-30 NOTE — Progress Notes (Signed)
LCSW Progress Note  [id number]   Samantha Ashley  11/30/2022  1:06 PM  Description:   Inpatient Psychiatric Referral  Patient was recommended inpatient per Eligha Bridegroom, NP. There are no available beds at [facility], per [facility] Rona Ravens, RN. Patient was referred to the following out of network facilities:   [facility] Provider Address Phone Fax  [facility]  [address] [place] [address] [phone]  [facility]  [address] [place], [place] [address] [phone]  [facility] [address] [place]  [address], [place] [address] [phone]  [facility]  [address] [place], [place] [address] [phone]  [facility]  [address] [place] [address] [phone]  [facility]  [address] [place] [address] [phone]  [facility]  [address] [place] [address] [phone]  [facility]  [address] [place], [place] [address] [phone]  [facility]  [address] [place] [address] [phone]  [facility]  [address] [place] [address] [phone]  [facility]  [address] [place] [address] [phone]  [facility]  [address] [place] [address] [phone]  [facility]  [address] [place] [address] [phone]  [facility]  [address] [place] [address] [phone]  [facility]  [address] [place] [address] [phone]  [facility]   [address] [place] [address] [phone]  [facility]  [address], [place] [address] [phone]  [facility]  [address], [place] [address] [phone]  [facility]  [address], [place] [address] [phone]  [facility]  [address], [place] [address] [phone]  [facility]  [address] [place], [place] [address] [phone]  [facility]  [address], [place] [address] [phone]  [facility]  [address] [place] [address] [phone]  [facility]  [address] [place] [address] [phone]  [facility]  [address], [place] [address] [phone]  [facility]  [address] [place] [address] [phone]  [facility]  [address], [place] [address] [phone]    Situation ongoing, CSW to continue following and update chart as more information becomes available.      [name], LCSW  11/30/2022 1:06 PM

## 2022-11-30 NOTE — ED Notes (Signed)
Patient lying in bed right side head covered with blanket

## 2022-11-30 NOTE — Progress Notes (Signed)
[facility] Psych ED Progress Note  11/30/2022 11:59 AM Samantha Ashley  MRN:  [id number]   Subjective:   Pt seen at [facility] for psychiatric reevaluation. Pt was originally seen by [facility] provider yesterday and recommended for inpatient psychiatric treatment. Upon initial evaluation she was labile, aggressive, tangential, and concerns for acute mania.   Today, pt is much more pleasant, she engaged in linear conversation, speech was slightly pressured, but she was logical and calm. She states she did get upset at parents house yesterday, and was throwing objects and breaking things. She reports being compliant with medications. I do mention the concerns her parents had about her not sleeping for the past 4 days in which she denies. She denies SI/HI. She denies AVH. She is tearful, stating she feels like her parents set her up and trigger her vs helping her in these episodes. This aggressive outburst at her parents house was triggered after an argument with them. She is wanting to discharge, she states she is fine and being in the hospital is triggering for her.   Pt is presenting much better today than yesterday, however per chart review patient did not sleep at all through out the night last night. It is likely her parents have valid concerns about her not sleeping the past few days. Due to pt history with bipolar and concerns for medication noncompliance/mania, will continue to recommend inpatient psychiatric treatment for med management, improvement in manic symptoms, and improvement in insomnia.   Principal Problem: Schizoaffective disorder, bipolar type (HCC) Diagnosis:  Principal Problem:   Schizoaffective disorder, bipolar type [facility])   ED Assessment Time Calculation: Start Time: 1130 Stop Time: 1200 Total Time in Minutes (Assessment Completion): 30   Past Psychiatric History:  PTSD, schizoaffective disorder, bipolar type  [place] Scale:  Flowsheet Row ED from 11[facility]29/20[facility] in [facility] Emergency  Department at [facility] ED from [facility]/2022 [facility] ED from 09/30/2022 in [facility] Emergency Department at [facility]  C-SSRS RISK CATEGORY No Risk Low Risk High Risk       Past Medical History:  Past Medical History:  Diagnosis Date   Asthma    History reviewed. No pertinent surgical history. Family History:  Family History  Problem Relation Age of Onset   Healthy Mother    Healthy Father    Social History:  Social History   Substance and Sexual Activity  Alcohol Use Not Currently     Social History   Substance and Sexual Activity  Drug Use Yes   Types: Marijuana    Social History   Socioeconomic History   Marital status: Married    Spouse name: Not on file   Number of children: Not on file   Years of education: Not on file   Highest education level: Not on file  Occupational History   Not on file  Tobacco Use   Smoking status: Every Day    Types: Cigars   Smokeless tobacco: Never  Substance and Sexual Activity   Alcohol use: Not Currently   Drug use: Yes    Types: Marijuana   Sexual activity: Yes    Birth control/protection: Pill  Other Topics Concern   Not on file  Social History Narrative   Not on file   Social Determinants of Health   Financial Resource Strain: Not on file  Food Insecurity: No Food Insecurity (10/22/2021)   [facility]   Hunger Vital Sign  Worried About Programme researcher, broadcasting/film/video in the Last Year: Never true    Ran Out of Food in the Last Year: Never true  Transportation Needs: Not on file  Physical Activity: Not on file  Stress: Not on file  Social Connections: Unknown (09/08/2021)   [facility]   Social Network    Social Network: Not on file    Sleep: Poor  Appetite:  Fair  Current Medications: Current Facility-Administered Medications  Medication Dose Route Frequency Provider Last Rate Last  Admin   ARIPiprazole (ABILIFY) tablet 15 mg  15 mg Oral Daily Pricilla Loveless, MD   15 mg at 11/30/22 0955   LORazepam (ATIVAN) tablet 1 mg  1 mg Oral Q4H PRN Eligha Bridegroom, NP       nicotine (NICODERM CQ - dosed in mg/24 hours) patch 21 mg  21 mg Transdermal Daily Pricilla Loveless, MD       OLANZapine (ZYPREXA) injection 10 mg  10 mg Intramuscular Once PRN Adria Dill, MD       OLANZapine zydis (ZYPREXA) disintegrating tablet 10 mg  10 mg Oral TID PRN Adria Dill, MD   10 mg at 11/30/22 0148   Oxcarbazepine (TRILEPTAL) tablet 300 mg  300 mg Oral BID Pricilla Loveless, MD   300 mg at 11/30/22 1610   Current Outpatient Medications  Medication Sig Dispense Refill   ARIPiprazole (ABILIFY) 15 MG tablet Take 15 mg by mouth daily.     carbamazepine (TEGRETOL XR) 200 MG 12 hr tablet Take 200 mg by mouth at bedtime.     JUNEL FE 1.5/30 1.5-30 MG-MCG tablet Take 1 tablet by mouth daily.     Oxcarbazepine (TRILEPTAL) 300 MG tablet Take 300 mg by mouth 2 (two) times daily.     FLUoxetine (PROZAC) 20 MG capsule Take 20 mg by mouth daily. (Patient not taking: Reported on 11/30/2022)     lithium 300 MG tablet Take 600 mg by mouth 2 (two) times daily with a meal. (Patient not taking: Reported on 11/30/2022)     nicotine (NICODERM CQ - DOSED IN MG/24 HOURS) 21 mg/24hr patch Place 1 patch (21 mg total) onto the skin daily. (Patient not taking: Reported on 11/30/2022) 28 patch 0    Lab Results:  Results for orders placed or performed during the hospital encounter of 11/29/22 (from the past 48 hour(s))  Comprehensive metabolic panel     Status: Abnormal   Collection Time: 11/29/22  9:50 PM  Result Value Ref Range   Sodium 138 135 - 145 mmol/L   Potassium 3.1 (L) 3.5 - 5.1 mmol/L   Chloride 107 98 - 111 mmol/L   CO2 21 (L) 22 - 32 mmol/L   Glucose, Bld 104 (H) 70 - 99 mg/dL    Comment: Glucose reference range applies only to samples taken after fasting for at least 8 hours.   BUN 7 6 - 20 mg/dL    Creatinine, Ser 9.60 (H) 0.44 - 1.00 mg/dL   Calcium 9.2 8.9 - 45.4 mg/dL   Total Protein 7.6 6.5 - 8.1 g/dL   Albumin 3.5 3.5 - 5.0 g/dL   AST 23 15 - 41 U/L   ALT 24 0 - 44 U/L   Alkaline Phosphatase 66 38 - 126 U/L   Total Bilirubin 0.5 0.3 - 1.2 mg/dL   GFR, Estimated >09 >81 mL/min    Comment: (NOTE) Calculated using the CKD-EPI Creatinine Equation (2021)    Anion gap 10 5 -  15    Comment: Performed at [facility], [address], [place], [place] [address]  Ethanol     Status: None   Collection Time: 11/29/22  9:50 PM  Result Value Ref Range   Alcohol, Ethyl (B) <10 <10 mg/dL    Comment: (NOTE) Lowest detectable limit for serum alcohol is 10 mg/dL.  For medical purposes only. Performed at [facility], [address] [place], [place] [address]   CBC with Diff     Status: Abnormal   Collection Time: 11/29/22  9:50 PM  Result Value Ref Range   WBC 8.5 4.0 - 10.5 K/uL   RBC 4.38 3.87 - 5.11 MIL/uL   Hemoglobin 11.8 (L) 12.0 - 15.0 g/dL   HCT 81.1 91.4 - 78.2 %   MCV 85.4 80.0 - 100.0 fL   MCH 26.9 26.0 - 34.0 pg   MCHC 31.6 30.0 - 36.0 g/dL   RDW 95.6 21.3 - 08.6 %   Platelets 309 150 - 400 K/uL   nRBC 0.0 0.0 - 0.2 %   Neutrophils Relative % 55 %   Neutro Abs 4.7 1.7 - 7.7 K/uL   Lymphocytes Relative 38 %   Lymphs Abs 3.3 0.7 - 4.0 K/uL   Monocytes Relative 5 %   Monocytes Absolute 0.4 0.1 - 1.0 K/uL   Eosinophils Relative 1 %   Eosinophils Absolute 0.1 0.0 - 0.5 K/uL   Basophils Relative 1 %   Basophils Absolute 0.0 0.0 - 0.1 K/uL   Immature Granulocytes 0 %   Abs Immature Granulocytes 0.02 0.00 - 0.07 K/uL    Comment: Performed at [facility], [address], [place], [place] [address]  hCG, serum, qualitative     Status: None   Collection Time: 11/29/22  9:50 PM  Result Value Ref Range   Preg, Serum NEGATIVE NEGATIVE    Comment:        THE SENSITIVITY OF THIS METHODOLOGY IS >10 mIU/mL. Performed at [facility], [address] [place], [place] [address]   Urine rapid drug screen (hosp performed)     Status: Abnormal   Collection Time: 11/29/22 10:20 PM  Result Value Ref Range   Opiates NONE DETECTED NONE DETECTED   Cocaine NONE DETECTED NONE DETECTED   Benzodiazepines NONE DETECTED NONE DETECTED   Amphetamines NONE DETECTED NONE DETECTED   Tetrahydrocannabinol POSITIVE (A) NONE DETECTED   Barbiturates NONE DETECTED NONE DETECTED    Comment: (NOTE) DRUG SCREEN FOR MEDICAL PURPOSES ONLY.  IF CONFIRMATION IS NEEDED FOR ANY PURPOSE, NOTIFY LAB WITHIN 5 DAYS.  LOWEST DETECTABLE LIMITS FOR URINE DRUG SCREEN Drug Class                     Cutoff (ng/mL) Amphetamine and metabolites    1000 Barbiturate and metabolites    200 Benzodiazepine                 200 Opiates and metabolites        300 Cocaine and metabolites        300 THC                            50 Performed at [facility], [address], [place], [place] [address]     Blood Alcohol level:  Lab Results  Component Value Date   ETH <10 11/29/2022   ETH <10 10/20/2022  Psychiatric Specialty Exam:  Presentation  General Appearance:  Appropriate for Environment  Eye Contact: Fair  Speech: Pressured  Speech Volume: Normal  Handedness: Right   Mood and Affect  Mood: Anxious; Labile  Affect: Tearful; Labile   Thought Process  Thought Processes: Coherent  Descriptions of Associations:Intact  Orientation:Full (Time, Place and Person)  Thought Content:Perseveration  History of Schizophrenia/Schizoaffective disorder:Yes  Duration of Psychotic Symptoms:Greater than six months  Hallucinations:Hallucinations: None  Ideas of Reference:None  Suicidal Thoughts:Suicidal Thoughts: No  Homicidal Thoughts:Homicidal Thoughts: No   Sensorium  Memory: Immediate Fair; Recent Fair  Judgment: Poor  Insight: Lacking   Executive Functions  Concentration: Fair  Attention  Span: Fair  Recall: [employer] of Knowledge: Fair  Language: Fair   Psychomotor Activity  Psychomotor Activity: Psychomotor Activity: Normal   Assets  Assets: Physical Health; Resilience; Social Support   Sleep  Sleep: Sleep: Poor    Physical Exam: Physical Exam Neurological:     Mental Status: She is alert and oriented to person, place, and time.  Psychiatric:        Attention and Perception: Attention normal.        Mood and Affect: Mood is anxious. Affect is labile.        Speech: Speech is rapid and pressured.        Behavior: Behavior is cooperative.        Thought Content: Thought content normal.    Review of Systems  Psychiatric/Behavioral:  The patient has insomnia.        Pressured speech, labile emotions  All other systems reviewed and are negative.  Blood pressure 130/88, pulse 72, temperature 98.2 F (36.8 C), temperature source Oral, resp. rate 18, height 5\' 5"  (1.651 m), weight 113.4 kg, SpO2 100%. Body mass index is 41.6 kg/m.   Medical Decision Making: Pt case reviewed and discussed with Dr. Lucianne Muss. Will continue to recommend IVC and inpatient psychiatric treatment. There is no current availability at [facility], will have CSW fax out for IP placement.   - Continue home medications  - Patient did show drastic improvements over 24 hours. Will continue to monitor, and psychiatry will continue to evaluate daily for need for inpatient treatment.   Eligha Bridegroom, NP 11/30/2022, 11:59 AM

## 2022-11-30 NOTE — ED Notes (Signed)
Patient up walking in halls asked to go back to room did so

## 2022-11-30 NOTE — ED Notes (Signed)
Patient standing at nurses station staring down staff refusing to return to room

## 2022-11-30 NOTE — ED Notes (Signed)
Patient resting quietly in bed on left side respirations even and non labored sitter at bedside

## 2022-11-30 NOTE — ED Notes (Signed)
Fleet Contras RN able to get patient to take ordered meds sitter currently quietly talking with patient trying to get her to be calm and remain in bed and in room

## 2022-11-30 NOTE — Progress Notes (Signed)
   11/30/22 1100  Spiritual Encounters  Type of Visit Initial  Care provided to: Patient  Conversation partners present during Programmer, systems;Other (comment) Psychiatrist)  Referral source Patient request  Reason for visit Routine spiritual support (Pt requested prayer and a Bible)  OnCall Visit No  Spiritual Framework  Presenting Themes Values and beliefs  Patient Stress Factors Family relationships;Loss of control  Interventions  Spiritual Care Interventions Made Established relationship of care and support;Compassionate presence;Prayer;Encouragement   Chaplain responded to request by Pt for prayer and a Bible.  Pt stated she enjoyed reading Ecclesiastes and that it was meaningful to her.  Chaplain tried to initiate conversation but Pt not very conversive.  Pt did ask for prayer, primarily for her family, so chaplain lead prayer and explained to Pt how to reach out and see a chaplain again if she wanted.  Chaplain services remain available by Spiritual Consult or for emergent cases, paging [phone]  Chaplain Raelene Bott, MDiv [email or web address] [phone]

## 2022-11-30 NOTE — ED Notes (Signed)
Security to bedside as patient refusing to stay in room is yelling and threatening staff refusing to stay in room demanding to go home

## 2022-11-30 NOTE — ED Notes (Signed)
Chaplin came to bring the pt a requested Bible. He spent a few moments with the pt going over her favorite scriptures.

## 2022-11-30 NOTE — ED Notes (Signed)
Patient finally sitting quietly on bed starring down staff and yawning

## 2022-11-30 NOTE — ED Notes (Signed)
Patient remains agitated standing in hall continues to yell and threaten staff demanding to call home

## 2022-11-30 NOTE — ED Notes (Addendum)
IVC Paperwork completed. Exp 12/07/22

## 2022-11-30 NOTE — ED Notes (Signed)
Pt back into hall goes to rr after grabbing glove off wall

## 2022-11-30 NOTE — ED Notes (Signed)
Patient given sack lunch and drink

## 2022-11-30 NOTE — ED Notes (Signed)
Patient once again screaming and cursing demanding to go home states she has done evey thing we asked her and we are holding her against her will and she has grounds to sue [place] all . Patient will not get into bed stating that's not my f---ing bed

## 2022-11-30 NOTE — ED Notes (Signed)
New sitter arrived taking report from previous sitter

## 2022-11-30 NOTE — ED Notes (Signed)
Security has left patient continues to curse and scream at staff demanding to go home

## 2022-11-30 NOTE — ED Notes (Signed)
Patient refusing to take medications

## 2022-11-30 NOTE — ED Notes (Signed)
Pt's mother dropped off her glasses with security in the front of the ED. Pt's chart still blocked and no information was given. Pt's wife is the only person allowed to have access to her chart.

## 2022-12-01 DIAGNOSIS — F312 Bipolar disorder, current episode manic severe with psychotic features: Secondary | ICD-10-CM | POA: Diagnosis not present

## 2022-12-01 DIAGNOSIS — G4700 Insomnia, unspecified: Secondary | ICD-10-CM | POA: Diagnosis not present

## 2022-12-01 DIAGNOSIS — F250 Schizoaffective disorder, bipolar type: Secondary | ICD-10-CM | POA: Diagnosis not present

## 2022-12-01 DIAGNOSIS — Z62811 Personal history of psychological abuse in childhood: Secondary | ICD-10-CM | POA: Diagnosis not present

## 2022-12-01 DIAGNOSIS — Z9151 Personal history of suicidal behavior: Secondary | ICD-10-CM | POA: Diagnosis not present

## 2022-12-01 DIAGNOSIS — F1220 Cannabis dependence, uncomplicated: Secondary | ICD-10-CM | POA: Diagnosis not present

## 2022-12-01 DIAGNOSIS — Z62810 Personal history of physical and sexual abuse in childhood: Secondary | ICD-10-CM | POA: Diagnosis not present

## 2022-12-01 DIAGNOSIS — J45909 Unspecified asthma, uncomplicated: Secondary | ICD-10-CM | POA: Diagnosis not present

## 2022-12-01 DIAGNOSIS — F259 Schizoaffective disorder, unspecified: Secondary | ICD-10-CM | POA: Diagnosis not present

## 2022-12-01 DIAGNOSIS — Z1152 Encounter for screening for COVID-19: Secondary | ICD-10-CM | POA: Diagnosis not present

## 2022-12-01 DIAGNOSIS — Z91148 Patient's other noncompliance with medication regimen for other reason: Secondary | ICD-10-CM | POA: Diagnosis not present

## 2022-12-01 DIAGNOSIS — R45850 Homicidal ideations: Secondary | ICD-10-CM | POA: Diagnosis not present

## 2022-12-01 DIAGNOSIS — J45990 Exercise induced bronchospasm: Secondary | ICD-10-CM | POA: Diagnosis not present

## 2022-12-01 NOTE — ED Notes (Signed)
IVC PAPERWORK  READY FOR SHERIFF  TO TRANSPORT  WAITING FOR THEIR ARRIVAL TO PRINT THE EMTLA

## 2022-12-01 NOTE — Consult Note (Signed)
  Briefly spoke with patient. She remains upset about being IVC and being recommended for hospitalization. When she asked to go home and I explained recommendation again she immediately ended assessment and walked away, refusing to want to speak further.   Pt has been accepted to [facility], currently waiting on transportation.

## 2022-12-01 NOTE — ED Notes (Signed)
Report handoff given to Ramiro Harvest RN at [facility]. Call back # is [phone]. Facility prepared for pt arrival after 8AM

## 2022-12-01 NOTE — ED Provider Notes (Signed)
Emergency Medicine Observation Re-evaluation Note  Samantha Ashley is a 27 y.o. female, seen on rounds today.  Pt initially presented to the ED for complaints of Aggressive Behavior Currently, the patient is pacing  Physical Exam  BP 128/76 (BP Location: Left Arm)   Pulse 67   Temp 98.7 F (37.1 C) (Oral)   Resp 16   Ht 5\' 5"  (1.651 m)   Wt 113.4 kg   SpO2 100%   BMI 41.60 kg/m  Physical Exam General: NAD Cardiac: Regular HR Lungs: No respiratory distress   ED Course / MDM  EKG:EKG Interpretation Date/Time:  Sunday November 29 2022 21:26:56 EDT Ventricular Rate:  102 PR Interval:  142 QRS Duration:  92 QT Interval:  340 QTC Calculation: 443 R Axis:   47  Text Interpretation: Sinus tachycardia Atrial premature complex Borderline T abnormalities, diffuse leads when compared to prior, faster rate. No STEMI Confirmed by Theda Belfast ([id number]) on 11/29/2022 9:37:40 PM  I have reviewed the labs performed to date as well as medications administered while in observation.    Plan  Current plan is for inpatient psychiatric placement - tentative plan for [facility] acceptance this morning  Patient is under IVC    Terald Sleeper, MD 12/01/22 [phone]

## 2022-12-11 DIAGNOSIS — F259 Schizoaffective disorder, unspecified: Secondary | ICD-10-CM | POA: Diagnosis not present

## 2023-02-25 DIAGNOSIS — F3161 Bipolar disorder, current episode mixed, mild: Secondary | ICD-10-CM | POA: Diagnosis not present

## 2023-02-25 DIAGNOSIS — F413 Other mixed anxiety disorders: Secondary | ICD-10-CM | POA: Diagnosis not present
